# Patient Record
Sex: Female | Born: 1960 | Race: Black or African American | Hispanic: No | Marital: Married | Smoking: Current every day smoker
Health system: Southern US, Community
[De-identification: ages and names within clinical notes are randomized; demographics above are authoritative.]

## PROBLEM LIST (undated history)

## (undated) DIAGNOSIS — R519 Headache, unspecified: Secondary | ICD-10-CM

## (undated) DIAGNOSIS — I82621 Acute embolism and thrombosis of deep veins of right upper extremity: Secondary | ICD-10-CM

## (undated) DIAGNOSIS — I10 Essential (primary) hypertension: Secondary | ICD-10-CM

## (undated) DIAGNOSIS — E119 Type 2 diabetes mellitus without complications: Secondary | ICD-10-CM

## (undated) DIAGNOSIS — G8929 Other chronic pain: Secondary | ICD-10-CM

## (undated) DIAGNOSIS — R51 Headache: Secondary | ICD-10-CM

## (undated) DIAGNOSIS — R200 Anesthesia of skin: Secondary | ICD-10-CM

## (undated) DIAGNOSIS — H538 Other visual disturbances: Secondary | ICD-10-CM

## (undated) DIAGNOSIS — M542 Cervicalgia: Secondary | ICD-10-CM

## (undated) DIAGNOSIS — R202 Paresthesia of skin: Secondary | ICD-10-CM

## (undated) HISTORY — DX: Paresthesia of skin: R20.2

## (undated) HISTORY — DX: Headache, unspecified: R51.9

## (undated) HISTORY — DX: Type 2 diabetes mellitus without complications: E11.9

## (undated) HISTORY — DX: Headache: R51

## (undated) HISTORY — DX: Essential (primary) hypertension: I10

## (undated) HISTORY — DX: Anesthesia of skin: R20.0

## (undated) HISTORY — DX: Acute embolism and thrombosis of deep veins of right upper extremity: I82.621

## (undated) HISTORY — DX: Cervicalgia: M54.2

## (undated) HISTORY — PX: MYOMECTOMY: SHX85

## (undated) HISTORY — PX: OTHER SURGICAL HISTORY: SHX169

## (undated) HISTORY — DX: Other visual disturbances: H53.8

---

## 1999-07-10 ENCOUNTER — Encounter: Payer: Self-pay | Admitting: Cardiology

## 1999-07-10 ENCOUNTER — Ambulatory Visit (HOSPITAL_COMMUNITY): Admission: RE | Admit: 1999-07-10 | Discharge: 1999-07-10 | Payer: Self-pay | Admitting: Cardiology

## 2002-08-02 ENCOUNTER — Encounter: Admission: RE | Admit: 2002-08-02 | Discharge: 2002-08-02 | Payer: Self-pay | Admitting: Internal Medicine

## 2002-08-02 ENCOUNTER — Encounter: Payer: Self-pay | Admitting: Internal Medicine

## 2003-06-01 ENCOUNTER — Encounter: Payer: Self-pay | Admitting: Cardiology

## 2003-06-01 ENCOUNTER — Encounter: Admission: RE | Admit: 2003-06-01 | Discharge: 2003-06-01 | Payer: Self-pay | Admitting: Cardiology

## 2004-01-07 ENCOUNTER — Ambulatory Visit (HOSPITAL_BASED_OUTPATIENT_CLINIC_OR_DEPARTMENT_OTHER): Admission: RE | Admit: 2004-01-07 | Discharge: 2004-01-07 | Payer: Self-pay | Admitting: Cardiology

## 2004-08-31 ENCOUNTER — Emergency Department (HOSPITAL_COMMUNITY): Admission: EM | Admit: 2004-08-31 | Discharge: 2004-08-31 | Payer: Self-pay | Admitting: Family Medicine

## 2004-11-20 ENCOUNTER — Encounter: Admission: RE | Admit: 2004-11-20 | Discharge: 2004-11-20 | Payer: Self-pay | Admitting: Internal Medicine

## 2005-03-13 ENCOUNTER — Emergency Department (HOSPITAL_COMMUNITY): Admission: EM | Admit: 2005-03-13 | Discharge: 2005-03-13 | Payer: Self-pay | Admitting: Emergency Medicine

## 2005-06-11 ENCOUNTER — Ambulatory Visit (HOSPITAL_COMMUNITY): Admission: RE | Admit: 2005-06-11 | Discharge: 2005-06-11 | Payer: Self-pay | Admitting: Obstetrics

## 2006-10-07 ENCOUNTER — Encounter: Admission: RE | Admit: 2006-10-07 | Discharge: 2006-10-07 | Payer: Self-pay | Admitting: Family Medicine

## 2008-02-04 ENCOUNTER — Emergency Department (HOSPITAL_COMMUNITY): Admission: EM | Admit: 2008-02-04 | Discharge: 2008-02-04 | Payer: Self-pay | Admitting: Family Medicine

## 2008-02-14 ENCOUNTER — Ambulatory Visit (HOSPITAL_COMMUNITY): Admission: RE | Admit: 2008-02-14 | Discharge: 2008-02-14 | Payer: Self-pay | Admitting: Obstetrics

## 2008-02-23 ENCOUNTER — Encounter: Admission: RE | Admit: 2008-02-23 | Discharge: 2008-02-23 | Payer: Self-pay | Admitting: Obstetrics

## 2009-04-06 ENCOUNTER — Encounter: Admission: RE | Admit: 2009-04-06 | Discharge: 2009-04-06 | Payer: Self-pay | Admitting: Internal Medicine

## 2009-08-01 ENCOUNTER — Ambulatory Visit (HOSPITAL_COMMUNITY): Admission: RE | Admit: 2009-08-01 | Discharge: 2009-08-01 | Payer: Self-pay | Admitting: Nurse Practitioner

## 2009-08-23 ENCOUNTER — Emergency Department (HOSPITAL_COMMUNITY): Admission: EM | Admit: 2009-08-23 | Discharge: 2009-08-24 | Payer: Self-pay | Admitting: Emergency Medicine

## 2010-10-06 ENCOUNTER — Encounter: Payer: Self-pay | Admitting: Internal Medicine

## 2010-11-01 ENCOUNTER — Other Ambulatory Visit: Payer: Self-pay | Admitting: Internal Medicine

## 2010-11-01 DIAGNOSIS — Z1231 Encounter for screening mammogram for malignant neoplasm of breast: Secondary | ICD-10-CM

## 2010-11-19 ENCOUNTER — Ambulatory Visit: Payer: Self-pay

## 2010-11-25 ENCOUNTER — Ambulatory Visit: Payer: Self-pay

## 2010-11-27 ENCOUNTER — Ambulatory Visit: Payer: Self-pay

## 2010-12-01 ENCOUNTER — Emergency Department (HOSPITAL_COMMUNITY)
Admission: EM | Admit: 2010-12-01 | Discharge: 2010-12-01 | Disposition: A | Discharge status: Home / Self Care | Payer: Medicaid Other | Attending: Emergency Medicine | Admitting: Emergency Medicine

## 2010-12-01 DIAGNOSIS — M79609 Pain in unspecified limb: Secondary | ICD-10-CM | POA: Insufficient documentation

## 2010-12-01 DIAGNOSIS — I10 Essential (primary) hypertension: Secondary | ICD-10-CM | POA: Insufficient documentation

## 2010-12-01 LAB — POCT I-STAT, CHEM 8
Calcium, Ion: 1.12 mmol/L (ref 1.12–1.32)
Chloride: 94 mEq/L — ABNORMAL LOW (ref 96–112)
Creatinine, Ser: 1.1 mg/dL (ref 0.4–1.2)
Hemoglobin: 15.3 g/dL — ABNORMAL HIGH (ref 12.0–15.0)
Potassium: 3.4 mEq/L — ABNORMAL LOW (ref 3.5–5.1)
TCO2: 33 mmol/L (ref 0–100)

## 2010-12-01 LAB — POCT CARDIAC MARKERS: Troponin i, poc: <0.05 ng/mL (ref 0.00–0.09)

## 2011-03-11 ENCOUNTER — Ambulatory Visit: Payer: Medicaid Other | Attending: Neurosurgery

## 2011-03-11 DIAGNOSIS — IMO0001 Reserved for inherently not codable concepts without codable children: Secondary | ICD-10-CM | POA: Insufficient documentation

## 2011-03-11 DIAGNOSIS — M542 Cervicalgia: Secondary | ICD-10-CM | POA: Insufficient documentation

## 2011-03-11 DIAGNOSIS — R293 Abnormal posture: Secondary | ICD-10-CM | POA: Insufficient documentation

## 2011-03-11 DIAGNOSIS — M2569 Stiffness of other specified joint, not elsewhere classified: Secondary | ICD-10-CM | POA: Insufficient documentation

## 2011-03-27 ENCOUNTER — Ambulatory Visit: Payer: Medicaid Other | Attending: Neurosurgery

## 2011-03-27 DIAGNOSIS — R293 Abnormal posture: Secondary | ICD-10-CM | POA: Insufficient documentation

## 2011-03-27 DIAGNOSIS — IMO0001 Reserved for inherently not codable concepts without codable children: Secondary | ICD-10-CM | POA: Insufficient documentation

## 2011-03-27 DIAGNOSIS — M2569 Stiffness of other specified joint, not elsewhere classified: Secondary | ICD-10-CM | POA: Insufficient documentation

## 2011-03-27 DIAGNOSIS — M542 Cervicalgia: Secondary | ICD-10-CM | POA: Insufficient documentation

## 2011-04-01 ENCOUNTER — Ambulatory Visit: Payer: Medicaid Other

## 2011-04-08 ENCOUNTER — Ambulatory Visit: Payer: Medicaid Other

## 2011-04-17 ENCOUNTER — Ambulatory Visit: Payer: Medicaid Other

## 2012-02-06 ENCOUNTER — Other Ambulatory Visit: Payer: Self-pay | Admitting: Obstetrics

## 2012-02-06 DIAGNOSIS — Z1231 Encounter for screening mammogram for malignant neoplasm of breast: Secondary | ICD-10-CM

## 2012-03-04 ENCOUNTER — Ambulatory Visit (HOSPITAL_COMMUNITY): Payer: Medicaid Other

## 2012-03-25 ENCOUNTER — Ambulatory Visit (HOSPITAL_COMMUNITY): Payer: Medicaid Other | Attending: Obstetrics

## 2012-08-16 ENCOUNTER — Ambulatory Visit: Payer: Medicaid Other

## 2012-09-07 ENCOUNTER — Ambulatory Visit
Admission: RE | Admit: 2012-09-07 | Discharge: 2012-09-07 | Disposition: A | Discharge status: Home / Self Care | Payer: Medicaid Other | Source: Ambulatory Visit | Attending: Obstetrics | Admitting: Obstetrics

## 2012-09-07 DIAGNOSIS — Z1231 Encounter for screening mammogram for malignant neoplasm of breast: Secondary | ICD-10-CM

## 2012-09-20 ENCOUNTER — Other Ambulatory Visit: Payer: Self-pay | Admitting: Obstetrics

## 2012-09-20 DIAGNOSIS — R928 Other abnormal and inconclusive findings on diagnostic imaging of breast: Secondary | ICD-10-CM

## 2012-09-23 ENCOUNTER — Ambulatory Visit
Admission: RE | Admit: 2012-09-23 | Discharge: 2012-09-23 | Disposition: A | Discharge status: Home / Self Care | Payer: Medicaid Other | Source: Ambulatory Visit | Attending: Obstetrics | Admitting: Obstetrics

## 2012-09-23 DIAGNOSIS — R928 Other abnormal and inconclusive findings on diagnostic imaging of breast: Secondary | ICD-10-CM

## 2013-07-07 ENCOUNTER — Ambulatory Visit: Payer: Self-pay | Admitting: Obstetrics

## 2013-07-07 ENCOUNTER — Encounter: Payer: Self-pay | Admitting: Obstetrics

## 2013-07-07 ENCOUNTER — Ambulatory Visit (INDEPENDENT_AMBULATORY_CARE_PROVIDER_SITE_OTHER): Payer: Medicaid Other | Admitting: Obstetrics

## 2013-07-07 VITALS — BP 115/74 | HR 66 | Temp 98.2°F | Ht 61.0 in | Wt 144.8 lb

## 2013-07-07 DIAGNOSIS — Z113 Encounter for screening for infections with a predominantly sexual mode of transmission: Secondary | ICD-10-CM

## 2013-07-07 DIAGNOSIS — Z0000 Encounter for general adult medical examination without abnormal findings: Secondary | ICD-10-CM

## 2013-07-07 NOTE — Progress Notes (Signed)
Subjective:     Darlene Castillo is a 52 y.o. female here for a routine exam.  Current complaints: states she is having infrequent irregular menstrual cycles.  Personal health questionnaire reviewed: yes   Gynecologic History Patient's last menstrual period was 05/23/2013. Contraception: none Last Pap: 2013. Results were: negative Last mammogram: 2013. Results were: normal  Obstetric History OB History  No data available     The following portions of the patient's history were reviewed and updated as appropriate: allergies, current medications, past family history, past medical history, past social history, past surgical history and problem list.  Review of Systems Pertinent items are noted in HPI.    Objective:    General appearance: alert and no distress Breasts: normal appearance, no masses or tenderness Abdomen: normal findings: soft, non-tender Pelvic: cervix normal in appearance, external genitalia normal, no adnexal masses or tenderness, no cervical motion tenderness, uterus normal size, shape, and consistency and vagina normal without discharge    Assessment:    Healthy female exam.    Plan:    Follow up in: 1 year.

## 2013-07-09 LAB — GC/CHLAMYDIA PROBE AMP: CT Probe RNA: NEGATIVE

## 2013-07-11 LAB — PAP IG W/ RFLX HPV ASCU

## 2013-07-15 ENCOUNTER — Telehealth: Payer: Self-pay | Admitting: *Deleted

## 2013-07-15 MED ORDER — METRONIDAZOLE 500 MG PO TABS: 500.0000 mg | ORAL_TABLET | Freq: Two times a day (BID) | ORAL | Status: DC

## 2013-07-15 NOTE — Telephone Encounter (Signed)
Call placed to patient no answer. A detailed voice message was left informing patient of lab results and rx to pharmacy. A voice message was left advising patient to call office if any questions.  

## 2013-07-15 NOTE — Telephone Encounter (Signed)
Message copied by Glendell Docker on Fri Jul 15, 2013  3:31 PM ------      Message from: Coral Ceo A      Created: Tue Jul 12, 2013  5:19 AM       Flagyl 500 mg po bid x 7 days. ------

## 2013-09-05 ENCOUNTER — Encounter: Payer: Medicaid Other | Attending: Internal Medicine | Admitting: *Deleted

## 2013-09-05 ENCOUNTER — Encounter: Payer: Self-pay | Admitting: *Deleted

## 2013-09-05 DIAGNOSIS — R7303 Prediabetes: Secondary | ICD-10-CM

## 2013-09-05 DIAGNOSIS — R7309 Other abnormal glucose: Secondary | ICD-10-CM | POA: Insufficient documentation

## 2013-09-05 DIAGNOSIS — Z713 Dietary counseling and surveillance: Secondary | ICD-10-CM | POA: Insufficient documentation

## 2013-09-05 DIAGNOSIS — E669 Obesity, unspecified: Secondary | ICD-10-CM | POA: Insufficient documentation

## 2013-09-05 NOTE — Progress Notes (Signed)
  Medical Nutrition Therapy:  Appt start time: 0800 end time:  0900.   Assessment:  Primary concerns today: Referred here for prediabetes.  HbA1C was 6.4%. Darlene Castillo wants to know what she can do to keep from becoming diabetic.  Darlene Castillo lives with her husband, her son, and her daughter.  Darlene Castillo does the food shopping, her husband and her do the food preparation.  Darlene Castillo likes to [employer], rice, and "quick stuff" thrown in the fryer.  Her husband likes to cook "more health conscience" stuff like vegetables.  She states she is ready to make serious changes.  She states that she is a soda lover.  Currently drinking 4-5 sodas per day.  They prepare their foods in different ways: baking, grilling, and frying.  Darlene Castillo states the family will eat out 2 maybe 3 times per week.  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  Preferred Learning Style all of the following:  Auditory  Visual  Hands on  Learning Readiness:  Ready  MEDICATIONS: see list   DIETARY INTAKE:  24-hr recall:  B ( AM): bagel and hot tea or pepsi  Snk ( AM): none L ( PM): none Snk (2-3 PM): feels woozy, grabs something quickly  D (7 PM): steak or chicken, or hibachi, will eat about 2 cups of rice at dinner  Snk ( PM): sometimes crackers or cookies Beverages: hot tea, soda, rarely water (more in summer)  Usual physical activity: none  Estimated energy needs: 1600 calories 180 g carbohydrates 120 g protein 44 g fat   Nutritional Diagnosis:  NI-5.8.2 Excessive carbohydrate intake As related to large portions of rice and sugary beverages consumed daily.  As evidenced by dietary recall and HbA1C 6.4%.    Intervention:  Nutrition counseling on basic carb counting to control intake of carbohydrates.  Discussed risk factors and physiology of diabetes and meaning of blood glucose numbers and HbA1c tests.  Talked about which foods contain carbohydrates and advised 3 carb choices per meal (45 grams) with half of  that (15-30 grams) at snacks.  Highly advised reducing soda intake gradually as she did not want to switch to diet sodas.  Encouraged Darlene Castillo to eat more regular well-balanced meals and snacks and to avoid skipping meals.  Teaching Method Utilized all of the following: Visual Auditory Hands on  Handouts given during visit include:  Diabetes Lifestyle Book  Yellow Meal Card  Barriers to learning/adherence to lifestyle change: none  Demonstrated degree of understanding via:  Teach Back   Monitoring/Evaluation:  Darlene Castillo would like to discuss heart healthy approaches, food labels, and physical exercise at her follow-up. Dietary intake, exercise, portion control, and body weight in 3 month(s).

## 2013-12-05 ENCOUNTER — Ambulatory Visit: Payer: Medicaid Other | Admitting: *Deleted

## 2014-03-01 ENCOUNTER — Other Ambulatory Visit: Payer: Self-pay | Admitting: Internal Medicine

## 2014-03-01 ENCOUNTER — Other Ambulatory Visit (HOSPITAL_COMMUNITY): Payer: Self-pay | Admitting: Internal Medicine

## 2014-03-01 DIAGNOSIS — Z1231 Encounter for screening mammogram for malignant neoplasm of breast: Secondary | ICD-10-CM

## 2014-03-01 DIAGNOSIS — I739 Peripheral vascular disease, unspecified: Secondary | ICD-10-CM

## 2014-03-03 ENCOUNTER — Ambulatory Visit (HOSPITAL_COMMUNITY)
Admission: RE | Admit: 2014-03-03 | Discharge: 2014-03-03 | Disposition: A | Discharge status: Home / Self Care | Payer: Medicaid Other | Source: Ambulatory Visit | Attending: Internal Medicine | Admitting: Internal Medicine

## 2014-03-03 DIAGNOSIS — I499 Cardiac arrhythmia, unspecified: Secondary | ICD-10-CM | POA: Diagnosis not present

## 2014-03-03 DIAGNOSIS — M79609 Pain in unspecified limb: Secondary | ICD-10-CM | POA: Diagnosis present

## 2014-03-03 DIAGNOSIS — I10 Essential (primary) hypertension: Secondary | ICD-10-CM | POA: Insufficient documentation

## 2014-03-03 DIAGNOSIS — I739 Peripheral vascular disease, unspecified: Secondary | ICD-10-CM

## 2014-03-03 NOTE — Progress Notes (Addendum)
VASCULAR LAB PRELIMINARY  ARTERIAL  ABI completed:    RIGHT    LEFT    PRESSURE WAVEFORM  PRESSURE WAVEFORM  BRACHIAL 129 Triphasic BRACHIAL 128 Triphasic  DP 138 Triphasic DP 140 Biphasic  PT 153 Biphasic PT 137 Biphasic    RIGHT LEFT  ABI 1.19 1.09   Resting ABIs and Doppler waveforms are within normal limits Patient was ambulated briskly for a period of five minutes with symptoms starting at 46 seconds in the thigh and increasing to the calves at completion. ABI at that time were right - 1.25 and the left - 1.15 with normal Doppler waveforms. Results not consistent with claudication. Patient experienced a cardiac arrhythmia post exercise.   SLAUGHTER, [place], RVS 03/03/2014, 3:17 PM

## 2014-03-22 ENCOUNTER — Ambulatory Visit
Admission: RE | Admit: 2014-03-22 | Discharge: 2014-03-22 | Disposition: A | Discharge status: Home / Self Care | Payer: Medicaid Other | Source: Ambulatory Visit | Attending: Internal Medicine | Admitting: Internal Medicine

## 2014-03-22 DIAGNOSIS — Z1231 Encounter for screening mammogram for malignant neoplasm of breast: Secondary | ICD-10-CM

## 2014-05-06 ENCOUNTER — Encounter (HOSPITAL_COMMUNITY): Payer: Self-pay | Admitting: Emergency Medicine

## 2014-05-06 ENCOUNTER — Emergency Department (HOSPITAL_COMMUNITY)
Admission: EM | Admit: 2014-05-06 | Discharge: 2014-05-06 | Disposition: A | Discharge status: Home / Self Care | Payer: Medicaid Other | Attending: Emergency Medicine | Admitting: Emergency Medicine

## 2014-05-06 DIAGNOSIS — F17200 Nicotine dependence, unspecified, uncomplicated: Secondary | ICD-10-CM | POA: Diagnosis not present

## 2014-05-06 DIAGNOSIS — Z79899 Other long term (current) drug therapy: Secondary | ICD-10-CM | POA: Insufficient documentation

## 2014-05-06 DIAGNOSIS — E119 Type 2 diabetes mellitus without complications: Secondary | ICD-10-CM | POA: Insufficient documentation

## 2014-05-06 DIAGNOSIS — I10 Essential (primary) hypertension: Secondary | ICD-10-CM | POA: Diagnosis not present

## 2014-05-06 DIAGNOSIS — R21 Rash and other nonspecific skin eruption: Secondary | ICD-10-CM | POA: Diagnosis present

## 2014-05-06 DIAGNOSIS — Z791 Long term (current) use of non-steroidal anti-inflammatories (NSAID): Secondary | ICD-10-CM | POA: Diagnosis not present

## 2014-05-06 MED ORDER — HYDROCORTISONE 2.5 % EX LOTN: TOPICAL_LOTION | Freq: Two times a day (BID) | CUTANEOUS | Status: DC

## 2014-05-06 NOTE — Discharge Instructions (Signed)
1. Medications: hydrocortisone, usual home medications 2. Treatment: rest, drink plenty of fluids, keep area clean and dry, try not to scratch it 3. Follow Up: Please followup with your primary doctor for discussion of your diagnoses and further evaluation after today's visit; if you do not have a primary care doctor use the resource guide provided to find one;     Rash A rash is a change in the color or texture of your skin. There are many different types of rashes. You may have other problems that accompany your rash. CAUSES   Infections.  Allergic reactions. This can include allergies to pets or foods.  Certain medicines.  Exposure to certain chemicals, soaps, or cosmetics.  Heat.  Exposure to poisonous plants.  Tumors, both cancerous and noncancerous. SYMPTOMS   Redness.  Scaly skin.  Itchy skin.  Dry or cracked skin.  Bumps.  Blisters.  Pain. DIAGNOSIS  Your caregiver may do a physical exam to determine what type of rash you have. A skin sample (biopsy) may be taken and examined under a microscope. TREATMENT  Treatment depends on the type of rash you have. Your caregiver may prescribe certain medicines. For serious conditions, you may need to see a skin doctor (dermatologist). HOME CARE INSTRUCTIONS   Avoid the substance that caused your rash.  Do not scratch your rash. This can cause infection.  You may take cool baths to help stop itching.  Only take over-the-counter or prescription medicines as directed by your caregiver.  Keep all follow-up appointments as directed by your caregiver. SEEK IMMEDIATE MEDICAL CARE IF:  You have increasing pain, swelling, or redness.  You have a fever.  You have new or severe symptoms.  You have body aches, diarrhea, or vomiting.  Your rash is not better after 3 days. MAKE SURE YOU:  Understand these instructions.  Will watch your condition.  Will get help right away if you are not doing well or get  worse. Document Released: 08/22/2002 Document Revised: 11/24/2011 Document Reviewed: 06/16/2011 [facility] Patient Information 2015 [place]. This information is not intended to replace advice given to you by your health care provider. Make sure you discuss any questions you have with your health care provider.

## 2014-05-06 NOTE — ED Provider Notes (Signed)
CSN: [id number]     Arrival date & time 05/06/14  1916 History   This chart was scribed for Joya Gaskins, MD by Modena Jansky, ED Scribe. This patient was seen in room TR07C/TR07C and the patient's care was started at 9:51 PM.   Chief Complaint  Patient presents with  . Rash   Patient is a 53 y.o. female presenting with rash. The history is provided by the patient and medical records. No language interpreter was used.  Rash Associated symptoms: no abdominal pain, no diarrhea, no fatigue, no fever, no headaches, no nausea, no shortness of breath, not vomiting and not wheezing    HPI Comments: Darlene Castillo is a 53 y.o. female who presents to the Emergency Department complaining of a rash on her left lower leg about a week. She states that her rash is unbearably itchy. She reports that she was prescribed a cream by her PCP. She states that the prescribed cream creates a burning sensation. She states that she used a benadryl spray with temporary relief. No travel, no lotions, detergents or other new contacts.  No fever, chills, purulent drainage.  PCP- Avbuere Past Medical History  Diagnosis Date  . Hypertension   . Diabetes mellitus without complication     diet controlled borderline   Past Surgical History  Procedure Laterality Date  . Cesarean section    . Myomectomy    . Wisdoms teeth extracted     Family History  Problem Relation Age of Onset  . Hypertension Mother   . Stroke Paternal Grandmother   . Hypertension Paternal Grandmother   . Diabetes Paternal Grandmother   . Gout Paternal Grandmother   . Alcohol abuse Paternal Grandfather    History  Substance Use Topics  . Smoking status: Current Every Day Smoker -- 0.25 packs/day for 30 years    Types: Cigarettes  . Smokeless tobacco: Not on file  . Alcohol Use: No   OB History   Grav Para Term Preterm Abortions TAB SAB Ect Mult Living                 Review of Systems  Constitutional: Negative for fever,  diaphoresis, appetite change, fatigue and unexpected weight change.  HENT: Negative for mouth sores.   Eyes: Negative for visual disturbance.  Respiratory: Negative for cough, chest tightness, shortness of breath and wheezing.   Cardiovascular: Negative for chest pain.  Gastrointestinal: Negative for nausea, vomiting, abdominal pain, diarrhea and constipation.  Endocrine: Negative for polydipsia, polyphagia and polyuria.  Genitourinary: Negative for dysuria, urgency, frequency and hematuria.  Musculoskeletal: Negative for back pain and neck stiffness.  Skin: Positive for rash.  Allergic/Immunologic: Negative for immunocompromised state.  Neurological: Negative for syncope, light-headedness and headaches.  Hematological: Does not bruise/bleed easily.  Psychiatric/Behavioral: Negative for sleep disturbance. The patient is not nervous/anxious.     Allergies  Review of patient's allergies indicates no known allergies.  Home Medications   Prior to Admission medications   Medication Sig Start Date End Date Taking? Authorizing Provider  amLODipine (NORVASC) 5 MG tablet Take 5 mg by mouth daily.    Historical Provider, MD  hydrocortisone 2.5 % lotion Apply topically 2 (two) times daily. 05/06/14   Buell Parcel, PA-C  lisinopril-hydrochlorothiazide (PRINZIDE,ZESTORETIC) 20-12.5 MG per tablet Take 1 tablet by mouth daily.    Historical Provider, MD  meloxicam (MOBIC) 7.5 MG tablet Take 7.5 mg by mouth daily.    Historical Provider, MD  metoprolol (LOPRESSOR) 100 MG tablet Take 100  mg by mouth 2 (two) times daily.    Historical Provider, MD  metroNIDAZOLE (FLAGYL) 500 MG tablet Take 1 tablet (500 mg total) by mouth 2 (two) times daily. 07/15/13   Brock Bad, MD  nebivolol (BYSTOLIC) 5 MG tablet Take 5 mg by mouth daily.    Historical Provider, MD   BP 131/76  Pulse 55  Temp(Src) 98.6 F (37 C) (Oral)  Resp 18  Ht 5\' 1"  (1.549 m)  Wt 140 lb (63.504 kg)  BMI 26.47 kg/m2  SpO2  100% Physical Exam  Nursing note and vitals reviewed. Constitutional: She is oriented to person, place, and time. She appears well-developed and well-nourished. No distress.  Awake, alert, nontoxic appearance  HENT:  Head: Normocephalic and atraumatic.  Mouth/Throat: Oropharynx is clear and moist. No oropharyngeal exudate.  Eyes: Conjunctivae are normal. No scleral icterus.  Neck: Normal range of motion. Neck supple.  Cardiovascular: Normal rate, regular rhythm, normal heart sounds and intact distal pulses.   No murmur heard. Pulmonary/Chest: Effort normal and breath sounds normal. No respiratory distress. She has no wheezes.  Equal chest expansion  Abdominal: Soft. Bowel sounds are normal. She exhibits no mass. There is no tenderness. There is no rebound and no guarding.  Musculoskeletal: Normal range of motion. She exhibits no edema.  Neurological: She is alert and oriented to person, place, and time. She exhibits normal muscle tone. Coordination normal.  Speech is clear and goal oriented Moves extremities without ataxia  Skin: Skin is warm and dry. Rash noted. She is not diaphoretic. There is erythema.  Small patch of erythema with raised, papular rash located on the lateral left ankle No rash anywhere else on the body  Psychiatric: She has a normal mood and affect.    ED Course  Procedures (including critical care time) DIAGNOSTIC STUDIES: Oxygen Saturation is 100% on RA, normal by my interpretation.    COORDINATION OF CARE: 9:55 PM- Pt advised of plan for treatment and pt agrees.  Labs Review Labs Reviewed - No data to display  Imaging Review No results found.   EKG Interpretation None      MDM   Final diagnoses:  Rash and nonspecific skin eruption   Darlene Castillo presents with nonspecific rash to the left ankle for several weeks. Patient without new detergents, soaps or other reasons to think she may have a contact dermatitis. Patient has been putting an unknown  cream prescribed to her by her primary care physician on this without relief.  No erythema, induration or evidence of secondary infection. Patient has a history of peripheral vascular disease and skin appears to be hypertrophic secondary to this.   Will give hydrocortisone cream for itching and patient is to see dermatology for further evaluation.  I have personally reviewed patient's vitals, nursing note and any pertinent labs or imaging.  I performed an undressed physical exam.    At this time, it has been determined that no acute conditions requiring further emergency intervention. The patient/guardian have been advised of the diagnosis and plan. I reviewed all labs and imaging including any potential incidental findings. We have discussed signs and symptoms that warrant return to the ED, such as erythema, induration, fevers or other signs of infection.  Patient/guardian has voiced understanding and agreed to follow-up with the PCP or specialist in  One week.  Vital signs are stable at discharge.   BP 121/73  Pulse 55  Temp(Src) 99 F (37.2 C) (Oral)  Resp 20  Ht 5'  1" (1.549 m)  Wt 140 lb (63.504 kg)  BMI 26.47 kg/m2  SpO2 100%        Dierdre ForthHannah Kden Wagster, PA-C 05/06/14 2217

## 2014-05-06 NOTE — ED Notes (Signed)
Patient states she has a rash to lower left leg, very itchy and she is unable to take it any longer.  Patient has been using the spray benadryl with immediate relief, but the itchiness comes back.

## 2014-05-08 NOTE — ED Provider Notes (Signed)
Medical screening examination/treatment/procedure(s) were performed by non-physician practitioner and as supervising physician I was immediately available for consultation/collaboration.   EKG Interpretation None        Joya Gaskins, MD 05/08/14 [phone]

## 2014-06-30 ENCOUNTER — Other Ambulatory Visit: Payer: Self-pay

## 2014-09-05 ENCOUNTER — Encounter: Payer: Self-pay | Admitting: Diagnostic Neuroimaging

## 2014-09-05 ENCOUNTER — Ambulatory Visit (INDEPENDENT_AMBULATORY_CARE_PROVIDER_SITE_OTHER): Payer: Medicaid Other | Admitting: Diagnostic Neuroimaging

## 2014-09-05 VITALS — BP 134/78 | HR 66 | Ht 61.0 in | Wt 147.2 lb

## 2014-09-05 DIAGNOSIS — M542 Cervicalgia: Secondary | ICD-10-CM

## 2014-09-05 DIAGNOSIS — R202 Paresthesia of skin: Secondary | ICD-10-CM

## 2014-09-05 DIAGNOSIS — R519 Headache, unspecified: Secondary | ICD-10-CM

## 2014-09-05 DIAGNOSIS — R200 Anesthesia of skin: Secondary | ICD-10-CM

## 2014-09-05 DIAGNOSIS — G43009 Migraine without aura, not intractable, without status migrainosus: Secondary | ICD-10-CM

## 2014-09-05 DIAGNOSIS — R51 Headache: Secondary | ICD-10-CM

## 2014-09-05 DIAGNOSIS — G4700 Insomnia, unspecified: Secondary | ICD-10-CM

## 2014-09-05 DIAGNOSIS — R0683 Snoring: Secondary | ICD-10-CM

## 2014-09-05 MED ORDER — AMITRIPTYLINE HCL 25 MG PO TABS: 25.0000 mg | ORAL_TABLET | Freq: Every day | ORAL | Status: DC

## 2014-09-05 NOTE — Progress Notes (Signed)
[facility]  PATIENT: Darlene HeftyWanda E Montoro DOB: 03/06/1961  REFERRING CLINICIAN: Avbuere HISTORY FROM: patient  REASON FOR VISIT: new consult    HISTORICAL  CHIEF COMPLAINT:  Chief Complaint  Patient presents with  . Headache  . Cold Extremity    fingers and toes    HISTORY OF PRESENT ILLNESS:   53 year old female here for evaluation of headaches and numbness. Has history of migraine headaches, hypertension, borderline diabetes.  5 years ago patient developed onset of migraine without aura. She had phonophobia, photophobia, nausea, triggered by certain smells. Headaches were bilateral severe throbbing. Headaches were controlled with Maxalt. Her last migraine was one year ago.  4 months ago patient had onset of a new type of headache, consisting of bilateral eye pain, eye pressure, blurred vision, daily severe headaches. Patient tried Tylenol without relief. No nausea or vomiting. No photophobia or phonophobia. Patient has had more stress in her life over the past one year. Patient also struggling with insomnia related to frequent urination and anxiety. Patient also has snoring.  Patient reports history of neck pain 2 years ago, treated with physical therapy. She may have had x-ray at that time showing some degenerative changes.  Now patient having more neck pain, tingling and numbness in her fingers and toes. She feels a cold sensation in her extremities. Patient had EMG nerve conduction study recently at another office which she tells me was normal.    REVIEW OF SYSTEMS: Full 14 system review of systems performed and notable only for insomnia daytime sleepiness snoring muscle cramps walking difficulty rash itching headache frequent urination.  ALLERGIES: No Known Allergies  HOME MEDICATIONS: Outpatient Prescriptions Prior to Visit  Medication Sig Dispense Refill  . amLODipine (NORVASC) 5 MG tablet Take 5 mg by mouth daily.    . hydrocortisone 2.5 % lotion  Apply topically 2 (two) times daily. 59 mL 0  . metoprolol (LOPRESSOR) 100 MG tablet Take 100 mg by mouth 2 (two) times daily.    . meloxicam (MOBIC) 7.5 MG tablet Take 7.5 mg by mouth daily as needed.     Marland Kitchen. lisinopril-hydrochlorothiazide (PRINZIDE,ZESTORETIC) 20-12.5 MG per tablet Take 1 tablet by mouth daily.    . metroNIDAZOLE (FLAGYL) 500 MG tablet Take 1 tablet (500 mg total) by mouth 2 (two) times daily. 14 tablet 0  . nebivolol (BYSTOLIC) 5 MG tablet Take 5 mg by mouth daily.     No facility-administered medications prior to visit.    PAST MEDICAL HISTORY: Past Medical History  Diagnosis Date  . Hypertension   . Diabetes mellitus without complication     diet controlled borderline    PAST SURGICAL HISTORY: Past Surgical History  Procedure Laterality Date  . Cesarean section    . Myomectomy    . Wisdoms teeth extracted      FAMILY HISTORY: Family History  Problem Relation Age of Onset  . Hypertension Mother   . Stroke Paternal Grandmother   . Hypertension Paternal Grandmother   . Diabetes Paternal Grandmother   . Gout Paternal Grandmother   . Alcohol abuse Paternal Grandfather     SOCIAL HISTORY:  History   Social History  . Marital Status: Married    Spouse Name: Marice PotterMilton Williamson    Number of Children: 3  . Years of Education: College   Occupational History  . Not on file.   Social History Main Topics  . Smoking status: Current Every Day Smoker -- 0.25 packs/day for 30 years    Types: Cigarettes  .  Smokeless tobacco: Not on file  . Alcohol Use: No  . Drug Use: No  . Sexual Activity: Not on file   Other Topics Concern  . Not on file   Social History Narrative   Patient lives at home with family.   Caffeine use: 4-5 cups daily     PHYSICAL EXAM  Filed Vitals:   09/05/14 0850  BP: 134/78  Pulse: 66  Height: 5\' 1"  (1.549 m)  Weight: 147 lb 3.2 oz (66.769 kg)    Body mass index is 27.83 kg/(m^2).   Visual Acuity Screening   Right eye  Left eye Both eyes  Without correction:     With correction: 20/30 20/30     No flowsheet data found.  GENERAL EXAM: Patient is in no distress; well developed, nourished and groomed; NECK MOVEMENT IS SLIGHTLY PAINFUL/ STIFF; TIRED APPEARING. PROPTOSIS. RED SCLERA.   CARDIOVASCULAR: Regular rate and rhythm, no murmurs, no carotid bruits  NEUROLOGIC: MENTAL STATUS: awake, alert, oriented to person, place and time, recent and remote memory intact, normal attention and concentration, language fluent, comprehension intact, naming intact, fund of knowledge appropriate CRANIAL NERVE: no papilledema on fundoscopic exam, pupils equal and reactive to light, visual fields full to confrontation, extraocular muscles intact, no nystagmus, facial sensation and strength symmetric, hearing intact, palate elevates symmetrically, uvula midline, shoulder shrug symmetric, tongue midline. MOTOR: normal bulk and tone, full strength in the BUE, BLE SENSORY: normal and symmetric to light touch, pinprick, temperature, vibration  COORDINATION: finger-nose-finger, fine finger movements normal REFLEXES: deep tendon reflexes present and symmetric; BUE 2, KNEES 3, ANKLES 0 GAIT/STATION: narrow based gait; able to walk on toes, heels and tandem; romberg is negative    DIAGNOSTIC DATA (LABS, IMAGING, TESTING) - I reviewed patient records, labs, notes, testing and imaging myself where available.  Lab Results  Component Value Date   HGB 15.3* 12/01/2010   HCT 45.0 12/01/2010      Component Value Date/Time   NA 140 12/01/2010 1834   K 3.4* 12/01/2010 1834   CL 94* 12/01/2010 1834   GLUCOSE 106* 12/01/2010 1834   BUN 10 12/01/2010 1834   CREATININE 1.1 12/01/2010 1834   No results found for: CHOL, HDL, LDLCALC, LDLDIRECT, TRIG, CHOLHDL No results found for: WUJW1X No results found for: VITAMINB12 No results found for: TSH    07/27/14 CT HEAD - normal    ASSESSMENT AND PLAN  53 y.o. year old female  here with migraine headaches since 2010, now with new onset and change in type of headache for past 4 months. Could represent chronic daily headaches related to insomnia, stress/anxiety, neck problems, or analgesic overuse. We'll check imaging to rule out secondary causes. Also with numbness and tingling in the toes and feet. Could be metabolic versus cervical spine disease.  PLAN: - MRI brain and c-spine - labs - trial of amitriptyline - try to reduce daily tylenol usage  Orders Placed This Encounter  Procedures  . MR Brain Wo Contrast  . MR Cervical Spine Wo Contrast  . Vitamin B12  . TSH  . Sjogren's syndrome antibods(ssa + ssb)  . ANA w/Reflex  . Sedimentation Rate  . C-reactive Protein  . Lyme, Total Ab Test/Reflex   Meds ordered this encounter  Medications  . amitriptyline (ELAVIL) 25 MG tablet    Sig: Take 1 tablet (25 mg total) by mouth at bedtime.    Dispense:  30 tablet    Refill:  6   Return in about 6 weeks (  around 10/17/2014).    Suanne MarkerVIKRAM R. Adrienne Trombetta, MD 09/05/2014, 9:35 AM Certified in Neurology, Neurophysiology and Neuroimaging  [facility] [address], [address] [place], [place] [address] [phone]

## 2014-09-05 NOTE — Patient Instructions (Signed)
I will check MRI scans and lab testing.  Try amitriptyline 25mg  every night at bedtime.

## 2014-09-06 LAB — TSH: TSH: 1.67 u[IU]/mL (ref 0.450–4.500)

## 2014-09-06 LAB — ANA W/REFLEX: Anti Nuclear Antibody(ANA): NEGATIVE

## 2014-09-06 LAB — VITAMIN B12: Vitamin B-12: 245 pg/mL (ref 211–946)

## 2014-09-06 LAB — LYME, TOTAL AB TEST/REFLEX

## 2014-09-06 LAB — SEDIMENTATION RATE: SED RATE: 31 mm/h (ref 0–40)

## 2014-09-06 LAB — SJOGREN'S SYNDROME ANTIBODS(SSA + SSB)
ENA SSA (RO) Ab: <0.2 AI (ref 0.0–0.9)
ENA SSB (LA) Ab: <0.2 AI (ref 0.0–0.9)

## 2014-09-06 LAB — C-REACTIVE PROTEIN: CRP: 6.3 mg/L — AB (ref 0.0–4.9)

## 2014-09-21 ENCOUNTER — Other Ambulatory Visit: Payer: Medicaid Other

## 2014-09-21 ENCOUNTER — Inpatient Hospital Stay: Admission: RE | Admit: 2014-09-21 | Payer: Medicaid Other | Source: Ambulatory Visit

## 2014-09-27 ENCOUNTER — Telehealth: Payer: Self-pay

## 2014-10-17 ENCOUNTER — Ambulatory Visit: Payer: Medicaid Other | Admitting: Diagnostic Neuroimaging

## 2015-02-16 ENCOUNTER — Other Ambulatory Visit: Payer: Self-pay

## 2015-02-16 DIAGNOSIS — Z1231 Encounter for screening mammogram for malignant neoplasm of breast: Secondary | ICD-10-CM

## 2015-03-28 ENCOUNTER — Ambulatory Visit
Admission: RE | Admit: 2015-03-28 | Discharge: 2015-03-28 | Disposition: A | Discharge status: Home / Self Care | Payer: Medicaid Other | Source: Ambulatory Visit

## 2015-03-28 DIAGNOSIS — Z1231 Encounter for screening mammogram for malignant neoplasm of breast: Secondary | ICD-10-CM

## 2016-06-12 ENCOUNTER — Other Ambulatory Visit: Payer: Self-pay | Admitting: Internal Medicine

## 2016-06-12 DIAGNOSIS — Z1231 Encounter for screening mammogram for malignant neoplasm of breast: Secondary | ICD-10-CM

## 2016-06-18 ENCOUNTER — Ambulatory Visit: Payer: Medicaid Other

## 2016-08-28 ENCOUNTER — Other Ambulatory Visit: Payer: Self-pay | Admitting: Internal Medicine

## 2016-08-28 DIAGNOSIS — E2839 Other primary ovarian failure: Secondary | ICD-10-CM

## 2017-04-07 ENCOUNTER — Ambulatory Visit: Payer: Medicaid Other | Admitting: Obstetrics

## 2017-05-01 ENCOUNTER — Encounter (HOSPITAL_COMMUNITY): Payer: Self-pay | Admitting: Emergency Medicine

## 2017-05-01 ENCOUNTER — Ambulatory Visit (HOSPITAL_COMMUNITY)
Admission: EM | Admit: 2017-05-01 | Discharge: 2017-05-01 | Disposition: A | Discharge status: Home / Self Care | Payer: Medicaid Other | Attending: Family Medicine | Admitting: Family Medicine

## 2017-05-01 DIAGNOSIS — J0101 Acute recurrent maxillary sinusitis: Secondary | ICD-10-CM | POA: Diagnosis not present

## 2017-05-01 MED ORDER — KETOTIFEN FUMARATE 0.025 % OP SOLN: 1.0000 [drp] | Freq: Two times a day (BID) | OPHTHALMIC | 0 refills | Status: DC

## 2017-05-01 MED ORDER — AMOXICILLIN-POT CLAVULANATE 875-125 MG PO TABS: 1.0000 | ORAL_TABLET | Freq: Two times a day (BID) | ORAL | 0 refills | Status: DC

## 2017-05-01 NOTE — ED Provider Notes (Signed)
  [facility]   [id number] 05/01/17 Arrival Time: 1009   SUBJECTIVE:  Darlene Castillo is a 56 y.o. female who presents to the urgent care  with complaint of sinus pain and pressure, with purulent mucus. This is been an ongoing for more than a week, had a case of sinusitis 1 month ago, treated with azithromycin, states that that improved, but now symptoms have returned. Denies fever or chills, does have congestion, no nausea or vomiting, back pain or dysuria, or other symptoms. She also has irritation to her left eye, states that she felt as if an eyelash, earlier this today. No change in vision, no blurred vision, no eye pain, or discharge, just describes as irritating sensation. No red eye, and no eye discharge.  ROS: As per HPI, remainder of ROS negative.   OBJECTIVE:  Vitals:   05/01/17 1057  BP: (!) 146/89  Pulse: 66  Resp: 20  Temp: 99.2 F (37.3 C)  TempSrc: Oral  SpO2: 98%     General appearance: alert; no distress HEENT: normocephalic; atraumatic; conjunctivae normal; No oropharyngeal erythema or edema, tonsils +1, no exudate. Maxillary sinus tenderness bilaterally, with purulent discharge noted on the turbinates. Tympanic membranes pearly gray, nonerythematous, nonbulging Neck: No cervical lymphadenopathy Lungs: clear to auscultation bilaterally Heart: regular rate and rhythm Abdomen: soft, non-tender; bowel sounds normal; no masses or organomegaly; no guarding or rebound tenderness Musculoskeletal/extremities: Pulses +2, no dependent edema, grossly symmetrical  Skin: warm and dry Neurologic: Grossly normal Psychological:  alert and cooperative; normal mood and affect     ASSESSMENT & PLAN:  No diagnosis found.  Meds ordered this encounter  Medications  . gabapentin (NEURONTIN) 100 MG capsule    Sig: Take 100 mg by mouth 3 (three) times daily.  Marland Kitchen amoxicillin-clavulanate (AUGMENTIN) 875-125 MG tablet    Sig: Take 1 tablet by mouth 2 (two) times daily.    Dispense:  14 tablet    Refill:  0    Order Specific Question:   Supervising Provider    Answer:   Elvina Sidle [[id number]]  . ketotifen (ZADITOR) 0.025 % ophthalmic solution    Sig: Place 1 drop into the left eye 2 (two) times daily.    Dispense:  5 mL    Refill:  0    Order Specific Question:   Supervising Provider    Answer:   Elvina Sidle [[id number]]    Reviewed expectations re: course of current medical issues. Questions answered. Outlined signs and symptoms indicating need for more acute intervention. Patient verbalized understanding. After Visit Summary given.    Procedures:     Labs Reviewed - No data to display  No results found.  Allergies  Allergen Reactions  . Ibuprofen Itching    PMHx, SurgHx, SocialHx, Medications, and Allergies were reviewed in the Visit Navigator and updated as appropriate.       Dorena Bodo, NP 05/01/17 1150

## 2017-05-01 NOTE — ED Triage Notes (Signed)
Pt here for poss sinus inf onset 1 week associated w/HA, facial pressure  Also c/o left eye pain onset today... sts she feels like something is inside of her  Denies blurred vision, injury   A&O x4... NAD... Ambulatory

## 2017-05-01 NOTE — Discharge Instructions (Signed)
You have acute sinusitis. I have prescribed Augmentin to treat your infection. Take 1 tablet twice a day for 7 days. You may take Tylenol every 4 hours for fever, pain, or headache, not to exceed 4,000 mg a day, or you may take Ibuprofen every 6-8 hours, not to exceed 1600 mg a day. For congestion, you may use Flonase nasal spray 2 sprays, each nostril once a day, or a pseudoephedrine containing product daily. I also recommend Mucinex, or Mucinex DM twice daily with a full glass of water.    For your eye, I prescribed eye drops, Zaditor, 1 drop twice a day. Follow up with your regular doctor as needed.

## 2017-05-19 ENCOUNTER — Ambulatory Visit: Payer: Medicaid Other | Admitting: Obstetrics

## 2017-11-19 ENCOUNTER — Ambulatory Visit (INDEPENDENT_AMBULATORY_CARE_PROVIDER_SITE_OTHER): Payer: BLUE CROSS/BLUE SHIELD | Admitting: Obstetrics

## 2017-11-19 ENCOUNTER — Other Ambulatory Visit (HOSPITAL_COMMUNITY)
Admission: RE | Admit: 2017-11-19 | Discharge: 2017-11-19 | Disposition: A | Discharge status: Home / Self Care | Payer: BLUE CROSS/BLUE SHIELD | Source: Ambulatory Visit | Attending: Obstetrics | Admitting: Obstetrics

## 2017-11-19 ENCOUNTER — Encounter: Payer: Self-pay | Admitting: Obstetrics

## 2017-11-19 ENCOUNTER — Other Ambulatory Visit: Payer: Self-pay

## 2017-11-19 VITALS — BP 126/76 | HR 61 | Ht 61.0 in | Wt 125.0 lb

## 2017-11-19 DIAGNOSIS — Z01419 Encounter for gynecological examination (general) (routine) without abnormal findings: Secondary | ICD-10-CM

## 2017-11-19 DIAGNOSIS — N951 Menopausal and female climacteric states: Secondary | ICD-10-CM

## 2017-11-19 NOTE — Progress Notes (Signed)
Subjective:        Darlene Castillo is a 57 y.o. female here for a routine exam.  Current complaints: Hot flashes, vaginal dryness and depressed mood..    Personal health questionnaire:  Is patient Darlene Castillo, have a family history of breast and/or ovarian cancer: no Is there a family history of uterine cancer diagnosed at age < 62, gastrointestinal cancer, urinary tract cancer, family member who is a Personnel officer syndrome-associated carrier: no Is the patient overweight and hypertensive, family history of diabetes, personal history of gestational diabetes, preeclampsia or PCOS: no Is patient over 109, have PCOS,  family history of premature CHD under age 35, diabetes, smoke, have hypertension or peripheral artery disease:  no At any time, has a partner hit, kicked or otherwise hurt or frightened you?: no Over the past 2 weeks, have you felt down, depressed or hopeless?: no Over the past 2 weeks, have you felt little interest or pleasure in doing things?:no   Gynecologic History No LMP recorded. Patient is postmenopausal. Contraception: post menopausal status Last Pap: 2014. Results were: normal Last mammogram: 2016. Results were: normal  Obstetric History OB History  No data available    Past Medical History:  Diagnosis Date  . Blurred vision   . Cervicalgia   . Diabetes mellitus without complication (HCC)    diet controlled borderline  . Headache   . Hypertension   . Numbness and tingling     Past Surgical History:  Procedure Laterality Date  . CESAREAN SECTION    . MYOMECTOMY    . wisdoms teeth extracted       Current Outpatient Medications:  .  amLODipine (NORVASC) 5 MG tablet, Take 5 mg by mouth daily., Disp: , Rfl:  .  hydrocortisone 2.5 % lotion, Apply topically 2 (two) times daily., Disp: 59 mL, Rfl: 0 .  lisinopril (PRINIVIL,ZESTRIL) 40 MG tablet, Take 1 tablet by mouth daily., Disp: , Rfl: 0 .  metoprolol (LOPRESSOR) 100 MG tablet, Take 100 mg by mouth 2 (two)  times daily., Disp: , Rfl:  .  amitriptyline (ELAVIL) 25 MG tablet, Take 1 tablet (25 mg total) by mouth at bedtime. (Patient not taking: Reported on 11/19/2017), Disp: 30 tablet, Rfl: 6 .  amoxicillin-clavulanate (AUGMENTIN) 875-125 MG tablet, Take 1 tablet by mouth 2 (two) times daily. (Patient not taking: Reported on 11/19/2017), Disp: 14 tablet, Rfl: 0 .  gabapentin (NEURONTIN) 100 MG capsule, Take 100 mg by mouth 3 (three) times daily., Disp: , Rfl:  .  ketotifen (ZADITOR) 0.025 % ophthalmic solution, Place 1 drop into the left eye 2 (two) times daily. (Patient not taking: Reported on 11/19/2017), Disp: 5 mL, Rfl: 0 Allergies  Allergen Reactions  . Ibuprofen Itching    Social History   Tobacco Use  . Smoking status: Current Every Day Smoker    Packs/day: 0.50    Years: 30.00    Pack years: 15.00    Types: Cigarettes  . Smokeless tobacco: Never Used  Substance Use Topics  . Alcohol use: No    Family History  Problem Relation Age of Onset  . Hypertension Mother   . Stroke Paternal Grandmother   . Hypertension Paternal Grandmother   . Diabetes Paternal Grandmother   . Gout Paternal Grandmother   . Alcohol abuse Paternal Grandfather       Review of Systems  Constitutional: negative for fatigue and weight loss Respiratory: negative for cough and wheezing Cardiovascular: negative for chest pain, fatigue and palpitations Gastrointestinal: negative  for abdominal pain and change in bowel habits Musculoskeletal:negative for myalgias Neurological: negative for gait problems and tremors Behavioral/Psych: negative for abusive relationship, depression Endocrine: negative for temperature intolerance    Genitourinary:negative for abnormal menstrual periods, genital lesions, hot flashes, sexual problems and vaginal discharge Integument/breast: negative for breast lump, breast tenderness, nipple discharge and skin lesion(s)    Objective:       BP 126/76   Pulse 61   Ht 5\' 1"  (1.549 m)    Wt 125 lb (56.7 kg)   BMI 23.62 kg/m  General:   alert  Skin:   no rash or abnormalities  Lungs:   clear to auscultation bilaterally  Heart:   regular rate and rhythm, S1, S2 normal, no murmur, click, rub or gallop  Breasts:   normal without suspicious masses, skin or nipple changes or axillary nodes  Abdomen:  normal findings: no organomegaly, soft, non-tender and no hernia  Pelvis:  External genitalia: normal general appearance Urinary system: urethral meatus normal and bladder without fullness, nontender Vaginal: normal without tenderness, induration or masses Cervix: normal appearance Adnexa: normal bimanual exam Uterus: anteverted and non-tender, normal size   Lab Review Urine pregnancy test Labs reviewed yes Radiologic studies reviewed yes  50% of 20 min visit spent on counseling and coordination of care.   Assessment:     1. 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 Follow up in: 3 years.   No orders of the defined types were placed in this encounter.    Brock BadHARLES A. Youcef Klas MD

## 2017-11-19 NOTE — Progress Notes (Signed)
Presents for AEX. No periods in more than 12 months.  Having Hot flashes, depression in the past and vaginal dryness.

## 2017-11-23 LAB — CYTOLOGY - PAP
Diagnosis: NEGATIVE
HPV: NOT DETECTED

## 2019-02-09 DIAGNOSIS — R7303 Prediabetes: Secondary | ICD-10-CM | POA: Insufficient documentation

## 2019-02-09 DIAGNOSIS — Z8669 Personal history of other diseases of the nervous system and sense organs: Secondary | ICD-10-CM | POA: Insufficient documentation

## 2019-02-09 DIAGNOSIS — E559 Vitamin D deficiency, unspecified: Secondary | ICD-10-CM | POA: Insufficient documentation

## 2019-02-09 DIAGNOSIS — I10 Essential (primary) hypertension: Secondary | ICD-10-CM | POA: Insufficient documentation

## 2019-02-09 DIAGNOSIS — N3281 Overactive bladder: Secondary | ICD-10-CM | POA: Insufficient documentation

## 2019-09-19 DIAGNOSIS — R200 Anesthesia of skin: Secondary | ICD-10-CM | POA: Insufficient documentation

## 2019-09-19 DIAGNOSIS — R202 Paresthesia of skin: Secondary | ICD-10-CM | POA: Insufficient documentation

## 2019-09-19 DIAGNOSIS — R519 Headache, unspecified: Secondary | ICD-10-CM | POA: Insufficient documentation

## 2019-09-27 ENCOUNTER — Encounter (HOSPITAL_COMMUNITY): Payer: Self-pay

## 2019-09-27 ENCOUNTER — Other Ambulatory Visit: Payer: Self-pay

## 2019-09-27 ENCOUNTER — Ambulatory Visit (HOSPITAL_COMMUNITY)
Admission: EM | Admit: 2019-09-27 | Discharge: 2019-09-27 | Disposition: A | Discharge status: Home / Self Care | Payer: BLUE CROSS/BLUE SHIELD | Attending: Physician Assistant | Admitting: Physician Assistant

## 2019-09-27 DIAGNOSIS — I10 Essential (primary) hypertension: Secondary | ICD-10-CM

## 2019-09-27 DIAGNOSIS — R600 Localized edema: Secondary | ICD-10-CM | POA: Diagnosis not present

## 2019-09-27 MED ORDER — FUROSEMIDE 20 MG PO TABS: 40.0000 mg | ORAL_TABLET | Freq: Every day | ORAL | 0 refills | Status: DC

## 2019-09-27 NOTE — Discharge Instructions (Addendum)
I have sent in a prescription for lasix, take 2 tablets for 3 days. This will make you have to use the bathroom more often.   Please hold your amlodipine for a few days, as this can contribute to leg swelling.   Please monitor your blood pressure.  Please follow up with your primary care provider for re-evaluation of your blood pressure and lower extremity swelling.  If your leg swelling worsens, I would like for you to return to Urgent care or go to the emergency department. IF you develop chest pain or shortness of breath, please go to the emergency department.

## 2019-09-27 NOTE — ED Triage Notes (Signed)
Pt states she has a numbness in her fingers in her left hand. Pt states she has some nerve damage. This is a on going  Issue.

## 2019-09-27 NOTE — ED Provider Notes (Signed)
[facility]    CSN: [id number] Arrival date & time: 09/27/19  1113      History   Chief Complaint Chief Complaint  Patient presents with  . Hypertension    HPI Darlene Castillo is a 59 y.o. female.   Patient reports to urgent care today for  High blood pressure and lower leg swelling. She reports she has had high blood pressure readings at home recently, as high as 166 systolic. She notes her pressures are usually 130 and below. She endorses pressure behind her eyes today with this. As well she woke this morning with swelling her left leg. She notes the swelling has gone down some. She does not generally have lower leg swelling. She was recently seen on 09/22/2019 by her primary care and placed on prednisone for sinus headache management. As well, she reports her amlodipine dosing was increased. She denies chest pain, shortness of breath and visual changes.  She also reports on and off issues with feeling like her fingers and toes are cold. This seems to be getting worse. She reports she has discussed this with her Primary care as well. She denies loss of sensation or weakness.      Past Medical History:  Diagnosis Date  . Blurred vision   . Cervicalgia   . Diabetes mellitus without complication (HCC)    diet controlled borderline  . Headache   . Hypertension   . Numbness and tingling     There are no problems to display for this patient.   Past Surgical History:  Procedure Laterality Date  . CESAREAN SECTION    . MYOMECTOMY    . wisdoms teeth extracted      OB History   No obstetric history on file.      Home Medications    Prior to Admission medications   Medication Sig Start Date End Date Taking? Authorizing Provider  amitriptyline (ELAVIL) 25 MG tablet Take 1 tablet (25 mg total) by mouth at bedtime. Patient not taking: Reported on 11/19/2017 09/05/14   Penumalli, Glenford Bayley, MD  amLODipine (NORVASC) 5 MG tablet Take 5 mg by mouth daily.    [provider]  amoxicillin-clavulanate (AUGMENTIN) 875-125 MG tablet Take 1 tablet by mouth 2 (two) times daily. Patient not taking: Reported on 11/19/2017 05/01/17   Dorena Bodo, NP  furosemide (LASIX) 20 MG tablet Take 2 tablets (40 mg total) by mouth daily for 3 days. 09/27/19 09/30/19  Amaury Kuzel, Veryl Speak, PA-C  gabapentin (NEURONTIN) 100 MG capsule Take 100 mg by mouth 3 (three) times daily.    [provider]  hydrocortisone 2.5 % lotion Apply topically 2 (two) times daily. 05/06/14   Muthersbaugh, Dahlia Client, PA-C  ketotifen (ZADITOR) 0.025 % ophthalmic solution Place 1 drop into the left eye 2 (two) times daily. Patient not taking: Reported on 11/19/2017 05/01/17   Dorena Bodo, NP  lisinopril (PRINIVIL,ZESTRIL) 40 MG tablet Take 1 tablet by mouth daily. 08/31/14   [provider]  metoprolol (LOPRESSOR) 100 MG tablet Take 100 mg by mouth 2 (two) times daily.    [provider]    Family History Family History  Problem Relation Age of Onset  . Hypertension Mother   . Stroke Paternal Grandmother   . Hypertension Paternal Grandmother   . Diabetes Paternal Grandmother   . Gout Paternal Grandmother   . Alcohol abuse Paternal Grandfather     Social History Social History   Tobacco Use  . Smoking status: Current Every  Day Smoker    Packs/day: 0.50    Years: 30.00    Pack years: 15.00    Types: Cigarettes  . Smokeless tobacco: Never Used  Substance Use Topics  . Alcohol use: No  . Drug use: No     Allergies   Ibuprofen   Review of Systems Review of Systems  Constitutional: Negative for chills and fever.  HENT: Positive for sinus pressure. Negative for ear pain and sore throat.   Eyes: Negative for pain and visual disturbance.  Respiratory: Negative for cough and shortness of breath.   Cardiovascular: Positive for leg swelling. Negative for chest pain and palpitations.  Gastrointestinal: Negative for abdominal pain, diarrhea, nausea and vomiting.    Endocrine: Negative for polyuria.  Genitourinary: Negative for dysuria and hematuria.  Musculoskeletal: Negative for arthralgias, back pain, joint swelling and myalgias.  Skin: Negative for color change and rash.  Neurological: Negative for dizziness, seizures, syncope, numbness and headaches.  All other systems reviewed and are negative.    Physical Exam Triage Vital Signs ED Triage Vitals  Enc Vitals Group     BP 09/27/19 1215 (!) 157/81     Pulse Rate 09/27/19 1215 68     Resp 09/27/19 1215 16     Temp 09/27/19 1215 98.4 F (36.9 C)     Temp Source 09/27/19 1215 Oral     SpO2 09/27/19 1215 100 %     Weight 09/27/19 1217 138 lb (62.6 kg)     Height --      Head Circumference --      Peak Flow --      Pain Score 09/27/19 1217 8     Pain Loc --      Pain Edu? --      Excl. in GC? --    No data found.  Updated Vital Signs BP (!) 157/81 (BP Location: Right Arm)   Pulse 68   Temp 98.4 F (36.9 C) (Oral)   Resp 16   Wt 138 lb (62.6 kg)   LMP 05/23/2013   SpO2 100%   BMI 26.07 kg/m   Visual Acuity Right Eye Distance:   Left Eye Distance:   Bilateral Distance:    Right Eye Near:   Left Eye Near:    Bilateral Near:     Physical Exam Vitals and nursing note reviewed.  Constitutional:      General: She is not in acute distress.    Appearance: She is well-developed. She is not ill-appearing.  HENT:     Head: Normocephalic and atraumatic.  Eyes:     General: No scleral icterus.    Extraocular Movements: Extraocular movements intact.     Conjunctiva/sclera: Conjunctivae normal.     Pupils: Pupils are equal, round, and reactive to light.  Cardiovascular:     Rate and Rhythm: Normal rate and regular rhythm.     Pulses: Normal pulses.     Heart sounds: No murmur. No friction rub. No gallop.   Pulmonary:     Effort: Pulmonary effort is normal. No respiratory distress.     Breath sounds: Normal breath sounds.  Abdominal:     Palpations: Abdomen is soft.      Tenderness: There is no abdominal tenderness.  Musculoskeletal:     Cervical back: Neck supple.     Right lower leg: Edema (1+ pitting edema on posterior aspect from ankle to inferior margin of calf) present.     Left lower leg: Edema (2+ pitting on posterior  lower leg from ankle to inferior margin of calf) present.     Comments: Of note, no pedal edema or anterior edema. No calf tenderness.  Extremities are warm to touch.   Skin:    General: Skin is warm and dry.     Capillary Refill: Capillary refill takes less than 2 seconds.  Neurological:     General: No focal deficit present.     Mental Status: She is alert and oriented to person, place, and time.     Cranial Nerves: No cranial nerve deficit.     Sensory: No sensory deficit.     Motor: No weakness.     Coordination: Coordination normal.     Gait: Gait normal.     Deep Tendon Reflexes: Reflexes normal.  Psychiatric:        Mood and Affect: Mood normal.        Behavior: Behavior normal.        Thought Content: Thought content normal.        Judgment: Judgment normal.      UC Treatments / Results  Labs (all labs ordered are listed, but only abnormal results are displayed) Labs Reviewed - No data to display  EKG   Radiology No results found.  Procedures Procedures (including critical care time)  Medications Ordered in UC Medications - No data to display  Initial Impression / Assessment and Plan / UC Course  I have reviewed the triage vital signs and the nursing notes.  Pertinent labs & imaging results that were available during my care of the patient were reviewed by me and considered in my medical decision making (see chart for details).     #HTN #Edema - Believe Edema is more likely caused by recent increases in blood pressure and amlodipine. Patient has been on prednisone and taking final dose. Reviewed lab read out from patients chart on phone, Cr 0.81 and K WNL. Sent for lasix for 3 days and holding  amlodipine for a couple days. Instructed to follow up with PCP to further discuss. Have lower suspicion for DVT as swelling is not truly unilateral is not effecting leg circumferentially and there are equally as plausible other causes. No history of DVT. Discussed red flag precautions.   #Cold hands and Feet - Deferred management of this to PCP. Neurologically she is intact. 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 Based on issues with blood pressure, believe addressing this prior to treatment is important.     Final Clinical Impressions(s) / UC Diagnoses   Final diagnoses:  Essential hypertension, benign  Localized edema     Discharge Instructions     I have sent in a prescription for lasix, take 2 tablets for 3 days. This will make you have to use the bathroom more often.   Please hold your amlodipine for a few days, as this can contribute to leg swelling.   Please monitor your blood pressure.  Please follow up with your primary care provider for re-evaluation of your blood pressure and lower extremity swelling.  If your leg swelling worsens, I would like for you to return to Urgent care or go to the emergency department. IF you develop chest pain or shortness of breath, please go to the emergency department.         ED Prescriptions    Medication Sig Dispense Auth. Provider   furosemide (LASIX) 20 MG tablet Take 2 tablets (40 mg total) by mouth daily for 3 days. 6 tablet Aron Inge, Edison Nasuti  E, PA-C     PDMP not reviewed this encounter.   Hermelinda Medicus, PA-C 09/27/19 1422

## 2019-10-12 ENCOUNTER — Encounter (HOSPITAL_COMMUNITY): Payer: Self-pay

## 2019-10-12 ENCOUNTER — Ambulatory Visit (HOSPITAL_COMMUNITY)
Admission: EM | Admit: 2019-10-12 | Discharge: 2019-10-12 | Disposition: A | Discharge status: Home / Self Care | Payer: BLUE CROSS/BLUE SHIELD | Attending: Family Medicine | Admitting: Family Medicine

## 2019-10-12 ENCOUNTER — Other Ambulatory Visit: Payer: Self-pay

## 2019-10-12 DIAGNOSIS — H579 Unspecified disorder of eye and adnexa: Secondary | ICD-10-CM | POA: Diagnosis not present

## 2019-10-12 MED ORDER — DEXAMETHASONE SODIUM PHOSPHATE 10 MG/ML IJ SOLN
10.0000 mg | Freq: Once | INTRAMUSCULAR | Status: AC
  Administered 2019-10-12: 10 mg via INTRAMUSCULAR

## 2019-10-12 MED ORDER — KETOROLAC TROMETHAMINE 30 MG/ML IJ SOLN
30.0000 mg | Freq: Once | INTRAMUSCULAR | Status: AC
  Administered 2019-10-12: 30 mg via INTRAMUSCULAR

## 2019-10-12 MED ORDER — DEXAMETHASONE SODIUM PHOSPHATE 10 MG/ML IJ SOLN
INTRAMUSCULAR | Status: AC
  Filled 2019-10-12: qty 1

## 2019-10-12 MED ORDER — KETOROLAC TROMETHAMINE 30 MG/ML IJ SOLN
INTRAMUSCULAR | Status: AC
  Filled 2019-10-12: qty 1

## 2019-10-12 NOTE — ED Triage Notes (Signed)
Pt states headaches and she under a lot of stress.

## 2019-10-12 NOTE — Discharge Instructions (Addendum)
Start taking Zyrtec and Flonase every day. You can alternate Tylenol and ibuprofen for your pain If this problem continues you will need to follow-up with primary care for further management.

## 2019-10-13 NOTE — ED Provider Notes (Signed)
[place]    CSN: [id number] Arrival date & time: 10/12/19  1404      History   Chief Complaint Chief Complaint  Patient presents with  . Headache    HPI Darlene Castillo is a 59 y.o. female.   Patient is a 59 year old female past medical history of diabetes, headache, hypertension, numbness and tingling.  She presents today with headache.  The pain is located behind both eyes and she describes this as pressure.  This has been an ongoing issue for over a month.  She was seen here on 09/27/2019 for essentially the same thing.  She does have some nasal congestion at times and rhinorrhea.  Has been treated for sinus infection without much relief of her symptoms. Is suppose to wear glasses but does not. strains her eye at times.  Takes Tylenol with temporary relief.  No dizziness, weakness, slurred speech. She also has chronic numbness and tingling in the fingers and toes. She has been evaluated for this before. Recently had blood work drawn that was all normal. She admits to being under a lot of stress. She has been having issues with her BP but is working with her primary care doctor on this. Recently started on lasix daily.  ROS per HPI    Headache   Past Medical History:  Diagnosis Date  . Blurred vision   . Cervicalgia   . Diabetes mellitus without complication (HCC)    diet controlled borderline  . Headache   . Hypertension   . Numbness and tingling     There are no problems to display for this patient.   Past Surgical History:  Procedure Laterality Date  . CESAREAN SECTION    . MYOMECTOMY    . wisdoms teeth extracted      OB History   No obstetric history on file.      Home Medications    Prior to Admission medications   Medication Sig Start Date End Date Taking? Authorizing Provider  amLODipine (NORVASC) 5 MG tablet Take 5 mg by mouth daily.    [provider]  furosemide (LASIX) 20 MG tablet Take 2 tablets (40 mg total) by mouth daily  for 3 days. 09/27/19 09/30/19  Darr, Marguerita Beards, PA-C  gabapentin (NEURONTIN) 100 MG capsule Take 100 mg by mouth 3 (three) times daily.    [provider]  hydrocortisone 2.5 % lotion Apply topically 2 (two) times daily. 05/06/14   Muthersbaugh, Jarrett Soho, PA-C  lisinopril (PRINIVIL,ZESTRIL) 40 MG tablet Take 1 tablet by mouth daily. 08/31/14   [provider]  metoprolol (LOPRESSOR) 100 MG tablet Take 100 mg by mouth 2 (two) times daily.    [provider]  amitriptyline (ELAVIL) 25 MG tablet Take 1 tablet (25 mg total) by mouth at bedtime. Patient not taking: Reported on 11/19/2017 09/05/14 10/12/19  Penni Bombard, MD    Family History Family History  Problem Relation Age of Onset  . Hypertension Mother   . Stroke Paternal Grandmother   . Hypertension Paternal Grandmother   . Diabetes Paternal Grandmother   . Gout Paternal Grandmother   . Alcohol abuse Paternal Grandfather     Social History Social History   Tobacco Use  . Smoking status: Current Every Day Smoker    Packs/day: 0.50    Years: 30.00    Pack years: 15.00    Types: Cigarettes  . Smokeless tobacco: Never Used  Substance Use Topics  . Alcohol use: No  . Drug  use: No     Allergies   Ibuprofen   Review of Systems Review of Systems  Neurological: Positive for headaches.     Physical Exam Triage Vital Signs ED Triage Vitals  Enc Vitals Group     BP 10/12/19 1424 130/76     Pulse Rate 10/12/19 1424 72     Resp 10/12/19 1424 18     Temp 10/12/19 1424 99.3 F (37.4 C)     Temp Source 10/12/19 1424 Oral     SpO2 10/12/19 1424 100 %     Weight --      Height --      Head Circumference --      Peak Flow --      Pain Score 10/12/19 1625 6     Pain Loc --      Pain Edu? --      Excl. in GC? --    No data found.  Updated Vital Signs BP (!) 150/78   Pulse 72   Temp 99.3 F (37.4 C) (Oral)   Resp 18   LMP 05/23/2013   SpO2 100%   Visual Acuity Right Eye Distance:     Left Eye Distance:   Bilateral Distance:    Right Eye Near:   Left Eye Near:    Bilateral Near:     Physical Exam Vitals and nursing note reviewed.  Constitutional:      General: She is not in acute distress.    Appearance: She is well-developed. She is not ill-appearing, toxic-appearing or diaphoretic.  HENT:     Head: Normocephalic and atraumatic.     Right Ear: Tympanic membrane and ear canal normal.     Left Ear: Tympanic membrane and ear canal normal.     Nose: Nose normal.     Mouth/Throat:     Pharynx: Oropharynx is clear.  Eyes:     Extraocular Movements: Extraocular movements intact.     Conjunctiva/sclera: Conjunctivae normal.     Pupils: Pupils are equal, round, and reactive to light.  Cardiovascular:     Rate and Rhythm: Normal rate and regular rhythm.     Pulses: Normal pulses.     Heart sounds: Normal heart sounds.  Pulmonary:     Effort: Pulmonary effort is normal.     Breath sounds: Normal breath sounds.  Musculoskeletal:        General: Normal range of motion.     Cervical back: Normal range of motion.  Skin:    General: Skin is warm and dry.  Neurological:     General: No focal deficit present.     Mental Status: She is alert.     Cranial Nerves: No cranial nerve deficit.     Sensory: No sensory deficit.     Motor: No weakness.     Gait: Gait normal.  Psychiatric:        Mood and Affect: Mood normal.      UC Treatments / Results  Labs (all labs ordered are listed, but only abnormal results are displayed) Labs Reviewed - No data to display  EKG   Radiology No results found.  Procedures Procedures (including critical care time)  Medications Ordered in UC Medications  ketorolac (TORADOL) 30 MG/ML injection 30 mg (30 mg Intramuscular Given 10/12/19 1610)  dexamethasone (DECADRON) injection 10 mg (10 mg Intramuscular Given 10/12/19 1610)    Initial Impression / Assessment and Plan / UC Course  I have reviewed the triage vital signs and  the nursing notes.  Pertinent labs & imaging results that were available during my care of the patient were reviewed by me and considered in my medical decision making (see chart for details).     Eye pressure- chronic migraine vs allergies vs straining of eyes do to not wearing glasses.  Exam benign, no red flags or Neuro deficits.  She has been evaluated for this before here recently.  We will try Toradol and decadron here  In the clinic to see if this helps. Will start Flonase and zyrtec daily.  Recommended her wear her glasses.  She can alternate tylenol and ibuprofen at home.  The numbness and tingling is chronic and she takes gabapentin daily which help with the nerve pain  reccommended follow up with PCP for imaging. No current neck pain but she reports neck issues in the past.  She needs to follow up with her primary care doctor.    Final Clinical Impressions(s) / UC Diagnoses   Final diagnoses:  Eye pressure     Discharge Instructions     Start taking Zyrtec and Flonase every day. You can alternate Tylenol and ibuprofen for your pain If this problem continues you will need to follow-up with primary care for further management.     ED Prescriptions    None     PDMP not reviewed this encounter.   Janace Aris, NP 10/13/19 1241

## 2019-10-17 ENCOUNTER — Other Ambulatory Visit: Payer: Self-pay

## 2019-10-17 ENCOUNTER — Emergency Department (HOSPITAL_COMMUNITY)
Admission: EM | Admit: 2019-10-17 | Discharge: 2019-10-18 | Disposition: A | Discharge status: Home / Self Care | Payer: BLUE CROSS/BLUE SHIELD | Attending: Emergency Medicine | Admitting: Emergency Medicine

## 2019-10-17 ENCOUNTER — Encounter (HOSPITAL_COMMUNITY): Payer: Self-pay | Admitting: Emergency Medicine

## 2019-10-17 DIAGNOSIS — Z79899 Other long term (current) drug therapy: Secondary | ICD-10-CM | POA: Diagnosis not present

## 2019-10-17 DIAGNOSIS — R200 Anesthesia of skin: Secondary | ICD-10-CM | POA: Insufficient documentation

## 2019-10-17 DIAGNOSIS — M5412 Radiculopathy, cervical region: Secondary | ICD-10-CM | POA: Diagnosis not present

## 2019-10-17 DIAGNOSIS — F17210 Nicotine dependence, cigarettes, uncomplicated: Secondary | ICD-10-CM | POA: Insufficient documentation

## 2019-10-17 DIAGNOSIS — M79602 Pain in left arm: Secondary | ICD-10-CM | POA: Diagnosis present

## 2019-10-17 DIAGNOSIS — I10 Essential (primary) hypertension: Secondary | ICD-10-CM | POA: Diagnosis not present

## 2019-10-17 DIAGNOSIS — E119 Type 2 diabetes mellitus without complications: Secondary | ICD-10-CM | POA: Insufficient documentation

## 2019-10-17 LAB — CBC
HCT: 40.6 % (ref 36.0–46.0)
Hemoglobin: 13 g/dL (ref 12.0–15.0)
MCH: 28.3 pg (ref 26.0–34.0)
MCHC: 32 g/dL (ref 30.0–36.0)
MCV: 88.5 fL (ref 80.0–100.0)
Platelets: 264 10*3/uL (ref 150–400)
RBC: 4.59 MIL/uL (ref 3.87–5.11)
RDW: 16 % — ABNORMAL HIGH (ref 11.5–15.5)
WBC: 8 10*3/uL (ref 4.0–10.5)
nRBC: 0 % (ref 0.0–0.2)

## 2019-10-17 LAB — BASIC METABOLIC PANEL
Anion gap: 10 (ref 5–15)
BUN: 11 mg/dL (ref 6–20)
CO2: 28 mmol/L (ref 22–32)
Calcium: 9.2 mg/dL (ref 8.9–10.3)
Chloride: 101 mmol/L (ref 98–111)
Creatinine, Ser: 0.76 mg/dL (ref 0.44–1.00)
GFR calc Af Amer: >60 mL/min (ref >60)
GFR calc non Af Amer: >60 mL/min (ref >60)
Glucose, Bld: 110 mg/dL — ABNORMAL HIGH (ref 70–99)
Potassium: 3.5 mmol/L (ref 3.5–5.1)
Sodium: 139 mmol/L (ref 135–145)

## 2019-10-17 LAB — TROPONIN I (HIGH SENSITIVITY): Troponin I (High Sensitivity): 6 ng/L (ref <18)

## 2019-10-17 MED ORDER — SODIUM CHLORIDE 0.9% FLUSH: 3.0000 mL | Freq: Once | INTRAVENOUS | Status: DC

## 2019-10-17 NOTE — ED Triage Notes (Signed)
Pt reports left sided neck pain down into left arm and also numbness in fingers- pt reports the numbness has been ongoing for over 1 month. Pt states yesterday she began to have left sided neck pain and arm pain which was new.

## 2019-10-18 LAB — TROPONIN I (HIGH SENSITIVITY): Troponin I (High Sensitivity): 6 ng/L (ref <18)

## 2019-10-18 MED ORDER — KETOROLAC TROMETHAMINE 60 MG/2ML IM SOLN
30.0000 mg | Freq: Once | INTRAMUSCULAR | Status: AC
  Administered 2019-10-18: 03:00:00 30 mg via INTRAMUSCULAR
  Filled 2019-10-18: qty 2

## 2019-10-18 MED ORDER — PREDNISONE 10 MG (21) PO TBPK: ORAL_TABLET | Freq: Every day | ORAL | 0 refills | Status: DC

## 2019-10-18 MED ORDER — LIDOCAINE 5 % EX PTCH: 1.0000 | MEDICATED_PATCH | CUTANEOUS | 0 refills | Status: DC

## 2019-10-18 MED ORDER — METHOCARBAMOL 500 MG PO TABS: 500.0000 mg | ORAL_TABLET | Freq: Two times a day (BID) | ORAL | 0 refills | Status: DC

## 2019-10-18 MED ORDER — LIDOCAINE 5 % EX PTCH
1.0000 | MEDICATED_PATCH | CUTANEOUS | Status: DC
  Administered 2019-10-18: 03:00:00 1 via TRANSDERMAL
  Filled 2019-10-18: qty 1

## 2019-10-18 MED ORDER — METHOCARBAMOL 500 MG PO TABS
500.0000 mg | ORAL_TABLET | Freq: Once | ORAL | Status: AC
  Administered 2019-10-18: 03:00:00 500 mg via ORAL
  Filled 2019-10-18: qty 1

## 2019-10-18 NOTE — Discharge Instructions (Addendum)
Thank you for allowing me to care for you today in the Emergency Department.   Please keep your follow-up appointment to have the MRI of your neck completed.  Take 1000 mg of Tylenol once every 8 hours for pain.  You can use 1 lidocaine patch and applied to areas that are sore.  Do not use heating pads over the lidocaine patch as this can cause burns to the skin.  Take prednisone as prescribed.  This is a steroid and can decrease inflammation around her neck.  Take 1 tablet of Robaxin, a muscle relaxer, up to 2 times daily.  Do not drink alcohol or take other sedating substances while you are taking this medication.  Return to the emergency department if you develop new rapidly worsening weakness, if you become unable to move your neck, develop high fevers, confusion, or other new, concerning symptoms.

## 2019-10-18 NOTE — ED Notes (Signed)
Patient reports 7/10 pain to left shoulder and radiating down arm

## 2019-10-18 NOTE — ED Provider Notes (Signed)
[facility] EMERGENCY DEPARTMENT Provider Note   CSN: [id number] Arrival date & time: 10/17/19  1711     History Chief Complaint  Patient presents with  . Chest Pain    Darlene Castillo is a 59 y.o. female with a history of cervicalgia, headache, and hypertension who presents to the emergency department with a chief complaint of left arm pain and numbness.  The patient reports that she has numbness radiating down her left arm into her first and second fingers.  She also reports sharp, shooting pain down her left arm from her neck.  She reports that that she has had this intermittently for several weeks, but her symptoms significantly worsened today after she lifted a heavy case of water.  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  Pain today feels similar to previous flareups of pain from her known bulging disc.  She reports that she is established with neurosurgery and is scheduled for an outpatient MRI in 1 week.  Her primary complaint is that since her symptoms worsen today that her pain has been severe.  She took Tylenol at home with minimal improvement in her symptoms.  She denies weakness, right arm numbness or pain, chest pain, shortness of breath, fever, chills, neck stiffness, headache, confusion, abdominal pain, nausea, vomiting, diarrhea.  No other treatment prior to arrival.  The history is provided by the patient. No language interpreter was used.       Past Medical History:  Diagnosis Date  . Blurred vision   . Cervicalgia   . Diabetes mellitus without complication (HCC)    diet controlled borderline  . Headache   . Hypertension   . Numbness and tingling     There are no problems to display for this patient.   Past Surgical History:  Procedure Laterality Date  . CESAREAN SECTION    . MYOMECTOMY    . wisdoms teeth extracted       OB History   No obstetric history on file.     Family History  Problem Relation Age of Onset  .  Hypertension Mother   . Stroke Paternal Grandmother   . Hypertension Paternal Grandmother   . Diabetes Paternal Grandmother   . Gout Paternal Grandmother   . Alcohol abuse Paternal Grandfather     Social History   Tobacco Use  . Smoking status: Current Every Day Smoker    Packs/day: 0.50    Years: 30.00    Pack years: 15.00    Types: Cigarettes  . Smokeless tobacco: Never Used  Substance Use Topics  . Alcohol use: No  . Drug use: No    Home Medications Prior to Admission medications   Medication Sig Start Date End Date Taking? Authorizing Provider  amLODipine (NORVASC) 5 MG tablet Take 5 mg by mouth daily.    [provider]  furosemide (LASIX) 20 MG tablet Take 2 tablets (40 mg total) by mouth daily for 3 days. 09/27/19 09/30/19  Darr, Veryl Speak, PA-C  gabapentin (NEURONTIN) 100 MG capsule Take 100 mg by mouth 3 (three) times daily.    [provider]  hydrocortisone 2.5 % lotion Apply topically 2 (two) times daily. 05/06/14   Muthersbaugh, Dahlia Client, PA-C  lidocaine (LIDODERM) 5 % Place 1 patch onto the skin daily. Remove & Discard patch within 12 hours or as directed by MD 10/18/19   ,  A, PA-C  lisinopril (PRINIVIL,ZESTRIL) 40 MG tablet Take 1 tablet by mouth  daily. 08/31/14   [provider]  methocarbamol (ROBAXIN) 500 MG tablet Take 1 tablet (500 mg total) by mouth 2 (two) times daily. 10/18/19   ,  A, PA-C  metoprolol (LOPRESSOR) 100 MG tablet Take 100 mg by mouth 2 (two) times daily.    [provider]  predniSONE (STERAPRED UNI-PAK 21 TAB) 10 MG (21) TBPK tablet Take by mouth daily. Take 6 tabs by mouth daily  for 2 days, then 5 tabs for 2 days, then 4 tabs for 2 days, then 3 tabs for 2 days, 2 tabs for 2 days, then 1 tab by mouth daily for 2 days 10/18/19   ,  A, PA-C  amitriptyline (ELAVIL) 25 MG tablet Take 1 tablet (25 mg total) by mouth at bedtime. Patient not taking: Reported on 11/19/2017 09/05/14 10/12/19   Suanne Marker, MD    Allergies    Ibuprofen  Review of Systems   Review of Systems  Constitutional: Negative for activity change, chills and fever.  Respiratory: Negative for shortness of breath.   Cardiovascular: Negative for chest pain.  Gastrointestinal: Negative for abdominal pain.  Genitourinary: Negative for dysuria.  Musculoskeletal: Positive for myalgias and neck pain. Negative for back pain, gait problem and joint swelling.  Skin: Negative for rash.  Allergic/Immunologic: Negative for immunocompromised state.  Neurological: Positive for numbness. Negative for weakness and headaches.  Psychiatric/Behavioral: Negative for confusion.    Physical Exam Updated Vital Signs BP 116/66   Pulse (!) 57   Temp 98.8 F (37.1 C) (Oral)   Resp 17   Ht 5\' 1"  (1.549 m)   Wt 62.1 kg   LMP 05/23/2013   SpO2 93%   BMI 25.89 kg/m   Physical Exam Vitals and nursing note reviewed.  Constitutional:      General: She is not in acute distress.    Appearance: She is not ill-appearing, toxic-appearing or diaphoretic.  HENT:     Head: Normocephalic.  Eyes:     Conjunctiva/sclera: Conjunctivae normal.  Neck:     Comments: No meningismus Cardiovascular:     Rate and Rhythm: Normal rate and regular rhythm.     Heart sounds: No murmur. No friction rub. No gallop.   Pulmonary:     Effort: Pulmonary effort is normal. No respiratory distress.     Breath sounds: No stridor. No wheezing, rhonchi or rales.  Chest:     Chest wall: No tenderness.  Abdominal:     General: There is no distension.     Palpations: Abdomen is soft. There is no mass.     Tenderness: There is no abdominal tenderness. There is no right CVA tenderness, left CVA tenderness, guarding or rebound.     Hernia: No hernia is present.  Musculoskeletal:     Cervical back: Neck supple.     Right lower leg: No edema.     Left lower leg: No edema.     Comments: Full active and passive range of motion of the cervical  spine.  No midline tenderness or tenderness to the bilateral paraspinal muscles to the cervical, thoracic, or lumbar spinous processes.  Sensation is intact and equal to the bilateral upper extremities, specifically in digits 1 and 2 on the left.  Out of 5 strength against resistance.  Good capillary refill.  Full active and passive range of motion of the bilateral shoulders, elbows, and wrists.  Skin:    General: Skin is warm.     Findings: No rash.  Neurological:  Mental Status: She is alert.  Psychiatric:        Behavior: Behavior normal.     ED Results / Procedures / Treatments   Labs (all labs ordered are listed, but only abnormal results are displayed) Labs Reviewed  BASIC METABOLIC PANEL - Abnormal; Notable for the following components:      Result Value   Glucose, Bld 110 (*)    All other components within normal limits  CBC - Abnormal; Notable for the following components:   RDW 16.0 (*)    All other components within normal limits  TROPONIN I (HIGH SENSITIVITY)  TROPONIN I (HIGH SENSITIVITY)    EKG EKG Interpretation  Date/Time:  Monday October 17 2019 17:44:14 EST Ventricular Rate:  76 PR Interval:  148 QRS Duration: 70 QT Interval:  382 QTC Calculation: 429 R Axis:   -22 Text Interpretation: Normal sinus rhythm Minimal voltage criteria for LVH, may be normal variant ( R in aVL ) Borderline ECG No significant change since last tracing Confirmed by Pryor Curia [phone]) on 10/18/2019 2:04:23 AM   Radiology No results found.  Procedures Procedures (including critical care time)  Medications Ordered in ED Medications  ketorolac (TORADOL) injection 30 mg (30 mg Intramuscular Given 10/18/19 0311)  methocarbamol (ROBAXIN) tablet 500 mg (500 mg Oral Given 10/18/19 0981)    ED Course  I have reviewed the triage vital signs and the nursing notes.  Pertinent labs & imaging results that were available during my care of the patient were reviewed by me and considered  in my medical decision making (see chart for details).    MDM Rules/Calculators/A&P                      59 year old female with history of cervicalgia presenting with worsening pain radiating down her left arm and numbness in the first and second digits on the left.  She has known bulging disc and is scheduled for an outpatient MRI next week.  Symptoms acutely worsened after a mechanical injury.  She has no chest pain or shortness of breath.  Screening labs were obtained by triage and are reassuring.  EKG is unchanged from previous.  Patient's primary complaint is her pain.  I have a low suspicion for occult fracture, meningitis, or infectious etiology.  Doubt ACS.  She was treated with Toradol, lidocaine patch, and initiated on a prednisone Dosepak and muscle relaxers.  She was advised to keep her outpatient MRI appointment.  Patient was discussed with Dr. Leonides Schanz, attending physician.  She is hemodynamically stable and in no acute distress.  Safe for discharge home with outpatient follow-up as indicated.  Final Clinical Impression(s) / ED Diagnoses Final diagnoses:  Cervical radiculopathy    Rx / DC Orders ED Discharge Orders         Ordered    predniSONE (STERAPRED UNI-PAK 21 TAB) 10 MG (21) TBPK tablet  Daily     10/18/19 0313    methocarbamol (ROBAXIN) 500 MG tablet  2 times daily     10/18/19 0314    lidocaine (LIDODERM) 5 %  Every 24 hours     10/18/19 0314           Joline Maxcy A, PA-C 10/18/19 0847    Ward, Delice Bison, DO 10/19/19 0130

## 2019-11-08 DIAGNOSIS — M5412 Radiculopathy, cervical region: Secondary | ICD-10-CM | POA: Insufficient documentation

## 2019-11-16 DIAGNOSIS — M5030 Other cervical disc degeneration, unspecified cervical region: Secondary | ICD-10-CM | POA: Insufficient documentation

## 2020-01-18 ENCOUNTER — Other Ambulatory Visit: Payer: Self-pay

## 2020-01-18 ENCOUNTER — Telehealth (HOSPITAL_COMMUNITY): Payer: Self-pay | Admitting: *Deleted

## 2020-01-18 ENCOUNTER — Inpatient Hospital Stay (HOSPITAL_COMMUNITY): Payer: BLUE CROSS/BLUE SHIELD | Admitting: Certified Registered Nurse Anesthetist

## 2020-01-18 ENCOUNTER — Emergency Department (HOSPITAL_COMMUNITY): Payer: BLUE CROSS/BLUE SHIELD

## 2020-01-18 ENCOUNTER — Encounter (HOSPITAL_COMMUNITY): Payer: Self-pay | Admitting: Emergency Medicine

## 2020-01-18 ENCOUNTER — Inpatient Hospital Stay (HOSPITAL_COMMUNITY): Payer: BLUE CROSS/BLUE SHIELD

## 2020-01-18 ENCOUNTER — Encounter (HOSPITAL_COMMUNITY): Admission: EM | Disposition: A | Discharge status: Home Health | Payer: Self-pay | Source: Home / Self Care

## 2020-01-18 ENCOUNTER — Inpatient Hospital Stay (HOSPITAL_COMMUNITY)
Admission: EM | Admit: 2020-01-18 | Discharge: 2020-02-17 | DRG: 003 | Disposition: A | Discharge status: Home Health | Payer: BLUE CROSS/BLUE SHIELD | Attending: Physician Assistant | Admitting: Physician Assistant

## 2020-01-18 DIAGNOSIS — R52 Pain, unspecified: Secondary | ICD-10-CM | POA: Diagnosis not present

## 2020-01-18 DIAGNOSIS — K668 Other specified disorders of peritoneum: Secondary | ICD-10-CM

## 2020-01-18 DIAGNOSIS — Z833 Family history of diabetes mellitus: Secondary | ICD-10-CM

## 2020-01-18 DIAGNOSIS — G9341 Metabolic encephalopathy: Secondary | ICD-10-CM | POA: Diagnosis present

## 2020-01-18 DIAGNOSIS — F1111 Opioid abuse, in remission: Secondary | ICD-10-CM | POA: Diagnosis not present

## 2020-01-18 DIAGNOSIS — K658 Other peritonitis: Secondary | ICD-10-CM | POA: Diagnosis present

## 2020-01-18 DIAGNOSIS — Z20822 Contact with and (suspected) exposure to covid-19: Secondary | ICD-10-CM | POA: Diagnosis present

## 2020-01-18 DIAGNOSIS — K5903 Drug induced constipation: Secondary | ICD-10-CM | POA: Diagnosis present

## 2020-01-18 DIAGNOSIS — I82621 Acute embolism and thrombosis of deep veins of right upper extremity: Secondary | ICD-10-CM | POA: Diagnosis present

## 2020-01-18 DIAGNOSIS — N281 Cyst of kidney, acquired: Secondary | ICD-10-CM | POA: Diagnosis present

## 2020-01-18 DIAGNOSIS — E440 Moderate protein-calorie malnutrition: Secondary | ICD-10-CM | POA: Diagnosis present

## 2020-01-18 DIAGNOSIS — I10 Essential (primary) hypertension: Secondary | ICD-10-CM | POA: Diagnosis present

## 2020-01-18 DIAGNOSIS — E872 Acidosis: Secondary | ICD-10-CM | POA: Diagnosis present

## 2020-01-18 DIAGNOSIS — J80 Acute respiratory distress syndrome: Secondary | ICD-10-CM | POA: Diagnosis not present

## 2020-01-18 DIAGNOSIS — E876 Hypokalemia: Secondary | ICD-10-CM | POA: Diagnosis present

## 2020-01-18 DIAGNOSIS — K5909 Other constipation: Secondary | ICD-10-CM | POA: Diagnosis not present

## 2020-01-18 DIAGNOSIS — J95851 Ventilator associated pneumonia: Secondary | ICD-10-CM | POA: Diagnosis present

## 2020-01-18 DIAGNOSIS — Z930 Tracheostomy status: Secondary | ICD-10-CM

## 2020-01-18 DIAGNOSIS — F1120 Opioid dependence, uncomplicated: Secondary | ICD-10-CM | POA: Diagnosis present

## 2020-01-18 DIAGNOSIS — R509 Fever, unspecified: Secondary | ICD-10-CM | POA: Diagnosis not present

## 2020-01-18 DIAGNOSIS — J95821 Acute postprocedural respiratory failure: Secondary | ICD-10-CM | POA: Diagnosis not present

## 2020-01-18 DIAGNOSIS — R739 Hyperglycemia, unspecified: Secondary | ICD-10-CM

## 2020-01-18 DIAGNOSIS — Z433 Encounter for attention to colostomy: Secondary | ICD-10-CM

## 2020-01-18 DIAGNOSIS — Z978 Presence of other specified devices: Secondary | ICD-10-CM

## 2020-01-18 DIAGNOSIS — D62 Acute posthemorrhagic anemia: Secondary | ICD-10-CM | POA: Diagnosis not present

## 2020-01-18 DIAGNOSIS — J810 Acute pulmonary edema: Secondary | ICD-10-CM | POA: Diagnosis not present

## 2020-01-18 DIAGNOSIS — D72819 Decreased white blood cell count, unspecified: Secondary | ICD-10-CM

## 2020-01-18 DIAGNOSIS — A419 Sepsis, unspecified organism: Secondary | ICD-10-CM | POA: Diagnosis present

## 2020-01-18 DIAGNOSIS — K633 Ulcer of intestine: Secondary | ICD-10-CM

## 2020-01-18 DIAGNOSIS — E1165 Type 2 diabetes mellitus with hyperglycemia: Secondary | ICD-10-CM | POA: Diagnosis present

## 2020-01-18 DIAGNOSIS — Y848 Other medical procedures as the cause of abnormal reaction of the patient, or of later complication, without mention of misadventure at the time of the procedure: Secondary | ICD-10-CM | POA: Diagnosis present

## 2020-01-18 DIAGNOSIS — R6521 Severe sepsis with septic shock: Secondary | ICD-10-CM | POA: Diagnosis present

## 2020-01-18 DIAGNOSIS — IMO0002 Reserved for concepts with insufficient information to code with codable children: Secondary | ICD-10-CM

## 2020-01-18 DIAGNOSIS — J150 Pneumonia due to Klebsiella pneumoniae: Secondary | ICD-10-CM | POA: Diagnosis not present

## 2020-01-18 DIAGNOSIS — J9621 Acute and chronic respiratory failure with hypoxia: Secondary | ICD-10-CM | POA: Diagnosis not present

## 2020-01-18 DIAGNOSIS — J9601 Acute respiratory failure with hypoxia: Secondary | ICD-10-CM | POA: Diagnosis not present

## 2020-01-18 DIAGNOSIS — K631 Perforation of intestine (nontraumatic): Principal | ICD-10-CM | POA: Diagnosis present

## 2020-01-18 DIAGNOSIS — Z7952 Long term (current) use of systemic steroids: Secondary | ICD-10-CM

## 2020-01-18 DIAGNOSIS — J151 Pneumonia due to Pseudomonas: Secondary | ICD-10-CM | POA: Diagnosis not present

## 2020-01-18 DIAGNOSIS — Z823 Family history of stroke: Secondary | ICD-10-CM

## 2020-01-18 DIAGNOSIS — Z4659 Encounter for fitting and adjustment of other gastrointestinal appliance and device: Secondary | ICD-10-CM

## 2020-01-18 DIAGNOSIS — Z9189 Other specified personal risk factors, not elsewhere classified: Secondary | ICD-10-CM

## 2020-01-18 DIAGNOSIS — K567 Ileus, unspecified: Secondary | ICD-10-CM | POA: Diagnosis present

## 2020-01-18 DIAGNOSIS — G9340 Encephalopathy, unspecified: Secondary | ICD-10-CM | POA: Diagnosis not present

## 2020-01-18 DIAGNOSIS — T40605A Adverse effect of unspecified narcotics, initial encounter: Secondary | ICD-10-CM | POA: Diagnosis present

## 2020-01-18 DIAGNOSIS — Z8249 Family history of ischemic heart disease and other diseases of the circulatory system: Secondary | ICD-10-CM

## 2020-01-18 DIAGNOSIS — Z87891 Personal history of nicotine dependence: Secondary | ICD-10-CM | POA: Diagnosis not present

## 2020-01-18 DIAGNOSIS — R0902 Hypoxemia: Secondary | ICD-10-CM

## 2020-01-18 DIAGNOSIS — Z9911 Dependence on respirator [ventilator] status: Secondary | ICD-10-CM

## 2020-01-18 DIAGNOSIS — R1030 Lower abdominal pain, unspecified: Secondary | ICD-10-CM

## 2020-01-18 DIAGNOSIS — F17210 Nicotine dependence, cigarettes, uncomplicated: Secondary | ICD-10-CM | POA: Diagnosis present

## 2020-01-18 DIAGNOSIS — R188 Other ascites: Secondary | ICD-10-CM

## 2020-01-18 DIAGNOSIS — Z79891 Long term (current) use of opiate analgesic: Secondary | ICD-10-CM | POA: Diagnosis not present

## 2020-01-18 DIAGNOSIS — J189 Pneumonia, unspecified organism: Secondary | ICD-10-CM | POA: Diagnosis not present

## 2020-01-18 DIAGNOSIS — K9189 Other postprocedural complications and disorders of digestive system: Secondary | ICD-10-CM | POA: Diagnosis present

## 2020-01-18 DIAGNOSIS — J9620 Acute and chronic respiratory failure, unspecified whether with hypoxia or hypercapnia: Secondary | ICD-10-CM | POA: Diagnosis not present

## 2020-01-18 DIAGNOSIS — T17908A Unspecified foreign body in respiratory tract, part unspecified causing other injury, initial encounter: Secondary | ICD-10-CM

## 2020-01-18 DIAGNOSIS — D649 Anemia, unspecified: Secondary | ICD-10-CM | POA: Diagnosis not present

## 2020-01-18 DIAGNOSIS — Z79899 Other long term (current) drug therapy: Secondary | ICD-10-CM

## 2020-01-18 DIAGNOSIS — G8929 Other chronic pain: Secondary | ICD-10-CM | POA: Diagnosis present

## 2020-01-18 HISTORY — PX: COLON RESECTION: SHX5231

## 2020-01-18 HISTORY — DX: Other chronic pain: G89.29

## 2020-01-18 LAB — MRSA PCR SCREENING: MRSA by PCR: NEGATIVE

## 2020-01-18 LAB — BLOOD GAS, ARTERIAL
Acid-base deficit: 8.5 mmol/L — ABNORMAL HIGH (ref 0.0–2.0)
Acid-base deficit: 7.6 mmol/L — ABNORMAL HIGH (ref 0.0–2.0)
Bicarbonate: 17.3 mmol/L — ABNORMAL LOW (ref 20.0–28.0)
Bicarbonate: 17 mmol/L — ABNORMAL LOW (ref 20.0–28.0)
Drawn by: 295031
FIO2: 90
MECHVT: 370 mL
O2 Saturation: 96.2 %
O2 Saturation: 98.7 %
PEEP: 10 cmH2O
Patient temperature: 98.6
Patient temperature: 98.6
RATE: 26 resp/min
pCO2 arterial: 38.9 mmHg (ref 32.0–48.0)
pCO2 arterial: 33.4 mmHg (ref 32.0–48.0)
pH, Arterial: 7.272 — ABNORMAL LOW (ref 7.350–7.450)
pH, Arterial: 7.328 — ABNORMAL LOW (ref 7.350–7.450)
pO2, Arterial: 94.4 mmHg (ref 83.0–108.0)
pO2, Arterial: 125 mmHg — ABNORMAL HIGH (ref 83.0–108.0)

## 2020-01-18 LAB — TRIGLYCERIDES: Triglycerides: 47 mg/dL (ref <150)

## 2020-01-18 LAB — URINALYSIS, ROUTINE W REFLEX MICROSCOPIC
Bilirubin Urine: NEGATIVE
Glucose, UA: 150 mg/dL — AB
Hgb urine dipstick: NEGATIVE
Ketones, ur: 5 mg/dL — AB
Leukocytes,Ua: NEGATIVE
Nitrite: NEGATIVE
Protein, ur: NEGATIVE mg/dL
Specific Gravity, Urine: 1.015 (ref 1.005–1.030)
pH: 5 (ref 5.0–8.0)

## 2020-01-18 LAB — HIV ANTIBODY (ROUTINE TESTING W REFLEX): HIV Screen 4th Generation wRfx: NONREACTIVE

## 2020-01-18 LAB — COMPREHENSIVE METABOLIC PANEL
ALT: 14 U/L (ref 0–44)
ALT: 12 U/L (ref 0–44)
AST: 26 U/L (ref 15–41)
AST: 28 U/L (ref 15–41)
Albumin: 4.5 g/dL (ref 3.5–5.0)
Albumin: 2.8 g/dL — ABNORMAL LOW (ref 3.5–5.0)
Alkaline Phosphatase: 66 U/L (ref 38–126)
Alkaline Phosphatase: 26 U/L — ABNORMAL LOW (ref 38–126)
Anion gap: 14 (ref 5–15)
Anion gap: 10 (ref 5–15)
BUN: 17 mg/dL (ref 6–20)
BUN: 13 mg/dL (ref 6–20)
CO2: 26 mmol/L (ref 22–32)
CO2: 20 mmol/L — ABNORMAL LOW (ref 22–32)
Calcium: 9 mg/dL (ref 8.9–10.3)
Calcium: 7.1 mg/dL — ABNORMAL LOW (ref 8.9–10.3)
Chloride: 101 mmol/L (ref 98–111)
Chloride: 112 mmol/L — ABNORMAL HIGH (ref 98–111)
Creatinine, Ser: 0.89 mg/dL (ref 0.44–1.00)
Creatinine, Ser: 0.78 mg/dL (ref 0.44–1.00)
GFR calc Af Amer: >60 mL/min (ref >60)
GFR calc Af Amer: >60 mL/min (ref >60)
GFR calc non Af Amer: >60 mL/min (ref >60)
GFR calc non Af Amer: >60 mL/min (ref >60)
Glucose, Bld: 257 mg/dL — ABNORMAL HIGH (ref 70–99)
Glucose, Bld: 141 mg/dL — ABNORMAL HIGH (ref 70–99)
Potassium: 2.5 mmol/L — CL (ref 3.5–5.1)
Potassium: 3 mmol/L — ABNORMAL LOW (ref 3.5–5.1)
Sodium: 141 mmol/L (ref 135–145)
Sodium: 142 mmol/L (ref 135–145)
Total Bilirubin: 0.4 mg/dL (ref 0.3–1.2)
Total Bilirubin: 0.8 mg/dL (ref 0.3–1.2)
Total Protein: 7.9 g/dL (ref 6.5–8.1)
Total Protein: 4.1 g/dL — ABNORMAL LOW (ref 6.5–8.1)

## 2020-01-18 LAB — GLUCOSE, CAPILLARY
Glucose-Capillary: 205 mg/dL — ABNORMAL HIGH (ref 70–99)
Glucose-Capillary: 153 mg/dL — ABNORMAL HIGH (ref 70–99)
Glucose-Capillary: 176 mg/dL — ABNORMAL HIGH (ref 70–99)
Glucose-Capillary: 139 mg/dL — ABNORMAL HIGH (ref 70–99)

## 2020-01-18 LAB — POCT I-STAT 7, (LYTES, BLD GAS, ICA,H+H)
Acid-base deficit: 11 mmol/L — ABNORMAL HIGH (ref 0.0–2.0)
Acid-base deficit: 9 mmol/L — ABNORMAL HIGH (ref 0.0–2.0)
Bicarbonate: 18.5 mmol/L — ABNORMAL LOW (ref 20.0–28.0)
Bicarbonate: 17.4 mmol/L — ABNORMAL LOW (ref 20.0–28.0)
Calcium, Ion: 1.19 mmol/L (ref 1.15–1.40)
Calcium, Ion: 1.12 mmol/L — ABNORMAL LOW (ref 1.15–1.40)
HCT: 41 % (ref 36.0–46.0)
HCT: 29 % — ABNORMAL LOW (ref 36.0–46.0)
Hemoglobin: 13.9 g/dL (ref 12.0–15.0)
Hemoglobin: 9.9 g/dL — ABNORMAL LOW (ref 12.0–15.0)
O2 Saturation: 100 %
O2 Saturation: 99 %
Patient temperature: 37
Potassium: 2.9 mmol/L — ABNORMAL LOW (ref 3.5–5.1)
Potassium: 2.6 mmol/L — CL (ref 3.5–5.1)
Sodium: 142 mmol/L (ref 135–145)
Sodium: 144 mmol/L (ref 135–145)
TCO2: 20 mmol/L — ABNORMAL LOW (ref 22–32)
TCO2: 19 mmol/L — ABNORMAL LOW (ref 22–32)
pCO2 arterial: 54.2 mmHg — ABNORMAL HIGH (ref 32.0–48.0)
pCO2 arterial: 36.8 mmHg (ref 32.0–48.0)
pH, Arterial: 7.14 — CL (ref 7.350–7.450)
pH, Arterial: 7.282 — ABNORMAL LOW (ref 7.350–7.450)
pO2, Arterial: 385 mmHg — ABNORMAL HIGH (ref 83.0–108.0)
pO2, Arterial: 182 mmHg — ABNORMAL HIGH (ref 83.0–108.0)

## 2020-01-18 LAB — CBC
HCT: 41.1 % (ref 36.0–46.0)
HCT: 40.4 % (ref 36.0–46.0)
HCT: 42.3 % (ref 36.0–46.0)
Hemoglobin: 12.6 g/dL (ref 12.0–15.0)
Hemoglobin: 12.7 g/dL (ref 12.0–15.0)
Hemoglobin: 13.7 g/dL (ref 12.0–15.0)
MCH: 27.2 pg (ref 26.0–34.0)
MCH: 28.4 pg (ref 26.0–34.0)
MCH: 27.8 pg (ref 26.0–34.0)
MCHC: 30.7 g/dL (ref 30.0–36.0)
MCHC: 31.4 g/dL (ref 30.0–36.0)
MCHC: 32.4 g/dL (ref 30.0–36.0)
MCV: 88.6 fL (ref 80.0–100.0)
MCV: 90.4 fL (ref 80.0–100.0)
MCV: 86 fL (ref 80.0–100.0)
Platelets: 280 10*3/uL (ref 150–400)
Platelets: 210 10*3/uL (ref 150–400)
Platelets: 243 10*3/uL (ref 150–400)
RBC: 4.64 MIL/uL (ref 3.87–5.11)
RBC: 4.47 MIL/uL (ref 3.87–5.11)
RBC: 4.92 MIL/uL (ref 3.87–5.11)
RDW: 13.2 % (ref 11.5–15.5)
RDW: 13.4 % (ref 11.5–15.5)
RDW: 13.4 % (ref 11.5–15.5)
WBC: 8.8 10*3/uL (ref 4.0–10.5)
WBC: 1.3 10*3/uL — CL (ref 4.0–10.5)
WBC: 2.3 10*3/uL — ABNORMAL LOW (ref 4.0–10.5)
nRBC: 0 % (ref 0.0–0.2)
nRBC: 0 % (ref 0.0–0.2)
nRBC: 0 % (ref 0.0–0.2)

## 2020-01-18 LAB — HEMOGLOBIN A1C
Hgb A1c MFr Bld: 6.3 % — ABNORMAL HIGH (ref 4.8–5.6)
Mean Plasma Glucose: 134.11 mg/dL

## 2020-01-18 LAB — LACTIC ACID, PLASMA
Lactic Acid, Venous: 7.3 mmol/L (ref 0.5–1.9)
Lactic Acid, Venous: 6.6 mmol/L (ref 0.5–1.9)

## 2020-01-18 LAB — BASIC METABOLIC PANEL
Anion gap: 8 (ref 5–15)
BUN: 16 mg/dL (ref 6–20)
CO2: 16 mmol/L — ABNORMAL LOW (ref 22–32)
Calcium: 7.3 mg/dL — ABNORMAL LOW (ref 8.9–10.3)
Chloride: 118 mmol/L — ABNORMAL HIGH (ref 98–111)
Creatinine, Ser: 0.83 mg/dL (ref 0.44–1.00)
GFR calc Af Amer: >60 mL/min (ref >60)
GFR calc non Af Amer: >60 mL/min (ref >60)
Glucose, Bld: 195 mg/dL — ABNORMAL HIGH (ref 70–99)
Potassium: 4 mmol/L (ref 3.5–5.1)
Sodium: 142 mmol/L (ref 135–145)

## 2020-01-18 LAB — RAPID URINE DRUG SCREEN, HOSP PERFORMED
Amphetamines: NOT DETECTED
Barbiturates: NOT DETECTED
Benzodiazepines: NOT DETECTED
Cocaine: NOT DETECTED
Opiates: NOT DETECTED
Tetrahydrocannabinol: NOT DETECTED

## 2020-01-18 LAB — MAGNESIUM: Magnesium: 2.2 mg/dL (ref 1.7–2.4)

## 2020-01-18 LAB — SAMPLE TO BLOOD BANK

## 2020-01-18 LAB — ABO/RH: ABO/RH(D): O POS

## 2020-01-18 LAB — TYPE AND SCREEN
ABO/RH(D): O POS
Antibody Screen: NEGATIVE

## 2020-01-18 LAB — RESPIRATORY PANEL BY RT PCR (FLU A&B, COVID)
Influenza A by PCR: NEGATIVE
Influenza B by PCR: NEGATIVE
SARS Coronavirus 2 by RT PCR: NEGATIVE

## 2020-01-18 LAB — LIPASE, BLOOD: Lipase: 19 U/L (ref 11–51)

## 2020-01-18 SURGERY — COLON RESECTION
Anesthesia: General | Site: Abdomen

## 2020-01-18 MED ORDER — PROMETHAZINE HCL 25 MG/ML IJ SOLN: 12.5000 mg | Freq: Four times a day (QID) | INTRAMUSCULAR | Status: DC | PRN

## 2020-01-18 MED ORDER — METOPROLOL TARTRATE 5 MG/5ML IV SOLN: 5.0000 mg | Freq: Four times a day (QID) | INTRAVENOUS | Status: DC | PRN

## 2020-01-18 MED ORDER — FENTANYL CITRATE (PF) 250 MCG/5ML IJ SOLN
INTRAMUSCULAR | Status: AC
  Filled 2020-01-18: qty 5

## 2020-01-18 MED ORDER — HYDROMORPHONE HCL 1 MG/ML IJ SOLN
1.0000 mg | Freq: Once | INTRAMUSCULAR | Status: AC
  Administered 2020-01-18: 1 mg via INTRAVENOUS
  Filled 2020-01-18: qty 1

## 2020-01-18 MED ORDER — ALBUMIN HUMAN 5 % IV SOLN
INTRAVENOUS | Status: AC
  Filled 2020-01-18: qty 250

## 2020-01-18 MED ORDER — ROCURONIUM BROMIDE 10 MG/ML (PF) SYRINGE
PREFILLED_SYRINGE | INTRAVENOUS | Status: DC | PRN
  Administered 2020-01-18: 50 mg via INTRAVENOUS

## 2020-01-18 MED ORDER — ONDANSETRON HCL 4 MG/2ML IJ SOLN: 4.0000 mg | Freq: Once | INTRAMUSCULAR | Status: DC | PRN

## 2020-01-18 MED ORDER — LACTATED RINGERS IV SOLN
INTRAVENOUS | Status: DC | PRN
Start: 2020-01-18 — End: 2020-01-18

## 2020-01-18 MED ORDER — SUCCINYLCHOLINE CHLORIDE 200 MG/10ML IV SOSY
PREFILLED_SYRINGE | INTRAVENOUS | Status: DC | PRN
  Administered 2020-01-18: 100 mg via INTRAVENOUS

## 2020-01-18 MED ORDER — DOCUSATE SODIUM 100 MG PO CAPS: 100.0000 mg | ORAL_CAPSULE | Freq: Two times a day (BID) | ORAL | Status: DC

## 2020-01-18 MED ORDER — NOREPINEPHRINE 4 MG/250ML-% IV SOLN: 0.0000 ug/min | INTRAVENOUS | Status: DC

## 2020-01-18 MED ORDER — POTASSIUM CHLORIDE 10 MEQ/100ML IV SOLN
10.0000 meq | INTRAVENOUS | Status: AC
  Administered 2020-01-18 (×2): 10 meq via INTRAVENOUS
  Filled 2020-01-18 (×2): qty 100

## 2020-01-18 MED ORDER — HYDROMORPHONE HCL 1 MG/ML IJ SOLN
0.5000 mg | Freq: Once | INTRAMUSCULAR | Status: AC
  Administered 2020-01-18: 0.5 mg via INTRAVENOUS
  Filled 2020-01-18: qty 1

## 2020-01-18 MED ORDER — FENTANYL CITRATE (PF) 100 MCG/2ML IJ SOLN
50.0000 ug | INTRAMUSCULAR | Status: AC | PRN
  Administered 2020-01-19 (×3): 50 ug via INTRAVENOUS
  Filled 2020-01-18: qty 2

## 2020-01-18 MED ORDER — CHLORHEXIDINE GLUCONATE CLOTH 2 % EX PADS
6.0000 | MEDICATED_PAD | Freq: Every day | CUTANEOUS | Status: DC
  Administered 2020-01-18 – 2020-02-16 (×36): 6 via TOPICAL

## 2020-01-18 MED ORDER — METHOCARBAMOL 1000 MG/10ML IJ SOLN
1000.0000 mg | Freq: Three times a day (TID) | INTRAVENOUS | Status: DC | PRN
  Filled 2020-01-18: qty 10

## 2020-01-18 MED ORDER — DIPHENHYDRAMINE HCL 50 MG/ML IJ SOLN: 25.0000 mg | Freq: Four times a day (QID) | INTRAMUSCULAR | Status: DC | PRN

## 2020-01-18 MED ORDER — IPRATROPIUM-ALBUTEROL 0.5-2.5 (3) MG/3ML IN SOLN
3.0000 mL | RESPIRATORY_TRACT | Status: DC
  Administered 2020-01-18 – 2020-01-21 (×17): 3 mL via RESPIRATORY_TRACT
  Filled 2020-01-18 (×17): qty 3

## 2020-01-18 MED ORDER — METOPROLOL TARTRATE 5 MG/5ML IV SOLN
5.0000 mg | Freq: Four times a day (QID) | INTRAVENOUS | Status: DC
  Filled 2020-01-18: qty 5

## 2020-01-18 MED ORDER — PHENYLEPHRINE HCL-NACL 10-0.9 MG/250ML-% IV SOLN
INTRAVENOUS | Status: DC | PRN
  Administered 2020-01-18: 100 ug/min via INTRAVENOUS

## 2020-01-18 MED ORDER — CHLORHEXIDINE GLUCONATE CLOTH 2 % EX PADS
6.0000 | MEDICATED_PAD | Freq: Once | CUTANEOUS | Status: AC
  Administered 2020-01-18: 6 via TOPICAL

## 2020-01-18 MED ORDER — ACETAMINOPHEN 650 MG RE SUPP: 650.0000 mg | Freq: Four times a day (QID) | RECTAL | Status: DC | PRN

## 2020-01-18 MED ORDER — INSULIN ASPART 100 UNIT/ML ~~LOC~~ SOLN: 0.0000 [IU] | SUBCUTANEOUS | Status: DC

## 2020-01-18 MED ORDER — PHENYLEPHRINE 40 MCG/ML (10ML) SYRINGE FOR IV PUSH (FOR BLOOD PRESSURE SUPPORT)
PREFILLED_SYRINGE | INTRAVENOUS | Status: DC | PRN
  Administered 2020-01-18 (×4): 80 ug via INTRAVENOUS

## 2020-01-18 MED ORDER — DEXAMETHASONE SODIUM PHOSPHATE 10 MG/ML IJ SOLN
INTRAMUSCULAR | Status: DC | PRN
  Administered 2020-01-18: 10 mg via INTRAVENOUS

## 2020-01-18 MED ORDER — FENTANYL CITRATE (PF) 100 MCG/2ML IJ SOLN
INTRAMUSCULAR | Status: AC
  Filled 2020-01-18: qty 2

## 2020-01-18 MED ORDER — PIPERACILLIN-TAZOBACTAM 3.375 G IVPB 30 MIN
3.3750 g | Freq: Once | INTRAVENOUS | Status: AC
  Administered 2020-01-18: 3.375 g via INTRAVENOUS
  Filled 2020-01-18: qty 50

## 2020-01-18 MED ORDER — ROCURONIUM BROMIDE 10 MG/ML (PF) SYRINGE
PREFILLED_SYRINGE | INTRAVENOUS | Status: AC
  Filled 2020-01-18: qty 10

## 2020-01-18 MED ORDER — FENTANYL CITRATE (PF) 100 MCG/2ML IJ SOLN: 50.0000 ug | Freq: Once | INTRAMUSCULAR | Status: DC

## 2020-01-18 MED ORDER — ORAL CARE MOUTH RINSE
15.0000 mL | OROMUCOSAL | Status: DC
  Administered 2020-01-18 – 2020-01-24 (×52): 15 mL via OROMUCOSAL

## 2020-01-18 MED ORDER — ENOXAPARIN SODIUM 40 MG/0.4ML ~~LOC~~ SOLN
40.0000 mg | SUBCUTANEOUS | Status: DC
  Administered 2020-01-19 – 2020-01-25 (×6): 40 mg via SUBCUTANEOUS
  Filled 2020-01-18 (×4): qty 0.4

## 2020-01-18 MED ORDER — PROPOFOL 1000 MG/100ML IV EMUL
5.0000 ug/kg/min | INTRAVENOUS | Status: DC
  Administered 2020-01-18: 35 ug/kg/min via INTRAVENOUS
  Administered 2020-01-18: 5 ug/kg/min via INTRAVENOUS
  Administered 2020-01-19: 10 ug/kg/min via INTRAVENOUS
  Filled 2020-01-18 (×3): qty 100

## 2020-01-18 MED ORDER — OLOPATADINE HCL 0.1 % OP SOLN
1.0000 [drp] | Freq: Two times a day (BID) | OPHTHALMIC | Status: DC | PRN
  Administered 2020-02-04 – 2020-02-13 (×5): 1 [drp] via OPHTHALMIC
  Filled 2020-01-18 (×2): qty 5

## 2020-01-18 MED ORDER — CHLORHEXIDINE GLUCONATE 0.12% ORAL RINSE (MEDLINE KIT)
15.0000 mL | Freq: Two times a day (BID) | OROMUCOSAL | Status: DC
  Administered 2020-01-18 – 2020-01-24 (×12): 15 mL via OROMUCOSAL

## 2020-01-18 MED ORDER — SODIUM CHLORIDE 0.9% FLUSH: 3.0000 mL | Freq: Once | INTRAVENOUS | Status: AC

## 2020-01-18 MED ORDER — ONDANSETRON 4 MG PO TBDP
4.0000 mg | ORAL_TABLET | Freq: Once | ORAL | Status: AC
  Administered 2020-01-18: 4 mg via ORAL
  Filled 2020-01-18: qty 1

## 2020-01-18 MED ORDER — FAMOTIDINE IN NACL 20-0.9 MG/50ML-% IV SOLN
20.0000 mg | Freq: Two times a day (BID) | INTRAVENOUS | Status: AC
  Administered 2020-01-18 – 2020-01-20 (×5): 20 mg via INTRAVENOUS
  Filled 2020-01-18 (×5): qty 50

## 2020-01-18 MED ORDER — PANTOPRAZOLE SODIUM 40 MG IV SOLR: 40.0000 mg | Freq: Every day | INTRAVENOUS | Status: DC

## 2020-01-18 MED ORDER — POTASSIUM CHLORIDE 10 MEQ/50ML IV SOLN
10.0000 meq | INTRAVENOUS | Status: AC
  Administered 2020-01-18 (×2): 10 meq via INTRAVENOUS
  Filled 2020-01-18 (×2): qty 50

## 2020-01-18 MED ORDER — MIDAZOLAM HCL 2 MG/2ML IJ SOLN
INTRAMUSCULAR | Status: AC
  Filled 2020-01-18: qty 2

## 2020-01-18 MED ORDER — PROPOFOL 10 MG/ML IV BOLUS
INTRAVENOUS | Status: AC
  Filled 2020-01-18: qty 20

## 2020-01-18 MED ORDER — METOPROLOL TARTRATE 25 MG PO TABS: 100.0000 mg | ORAL_TABLET | Freq: Two times a day (BID) | ORAL | Status: DC

## 2020-01-18 MED ORDER — NOREPINEPHRINE 4 MG/250ML-% IV SOLN
0.0000 ug/min | INTRAVENOUS | Status: DC
  Administered 2020-01-18: 2 ug/min via INTRAVENOUS
  Administered 2020-01-18: 12 ug/min via INTRAVENOUS
  Administered 2020-01-19: 11 ug/min via INTRAVENOUS
  Filled 2020-01-18 (×2): qty 250

## 2020-01-18 MED ORDER — SODIUM CHLORIDE 0.9 % IV BOLUS
1000.0000 mL | Freq: Once | INTRAVENOUS | Status: AC
  Administered 2020-01-18: 1000 mL via INTRAVENOUS

## 2020-01-18 MED ORDER — POTASSIUM CHLORIDE 10 MEQ/50ML IV SOLN
10.0000 meq | INTRAVENOUS | Status: AC
  Administered 2020-01-18 (×6): 10 meq via INTRAVENOUS
  Filled 2020-01-18 (×5): qty 50

## 2020-01-18 MED ORDER — EPHEDRINE SULFATE-NACL 50-0.9 MG/10ML-% IV SOSY
PREFILLED_SYRINGE | INTRAVENOUS | Status: DC | PRN
  Administered 2020-01-18: 10 mg via INTRAVENOUS

## 2020-01-18 MED ORDER — SODIUM CHLORIDE 0.9% FLUSH: 10.0000 mL | INTRAVENOUS | Status: DC | PRN

## 2020-01-18 MED ORDER — METHADONE HCL 10 MG/ML IJ SOLN
5.0000 mg | Freq: Three times a day (TID) | INTRAMUSCULAR | Status: DC
  Administered 2020-01-18 (×2): 5 mg via INTRAVENOUS
  Filled 2020-01-18 (×3): qty 0.5

## 2020-01-18 MED ORDER — 0.9 % SODIUM CHLORIDE (POUR BTL) OPTIME
TOPICAL | Status: DC | PRN
  Administered 2020-01-18: 10000 mL

## 2020-01-18 MED ORDER — HYDROMORPHONE HCL 1 MG/ML IJ SOLN
1.0000 mg | INTRAMUSCULAR | Status: DC | PRN
  Administered 2020-01-18 (×2): 1 mg via INTRAVENOUS
  Filled 2020-01-18 (×2): qty 1

## 2020-01-18 MED ORDER — ACETAMINOPHEN 10 MG/ML IV SOLN: 1000.0000 mg | Freq: Once | INTRAVENOUS | Status: DC | PRN

## 2020-01-18 MED ORDER — LISINOPRIL 20 MG PO TABS: 40.0000 mg | ORAL_TABLET | Freq: Every day | ORAL | Status: DC

## 2020-01-18 MED ORDER — HYDROMORPHONE HCL 2 MG/ML IJ SOLN
INTRAMUSCULAR | Status: AC
  Filled 2020-01-18: qty 1

## 2020-01-18 MED ORDER — IOHEXOL 300 MG/ML  SOLN
100.0000 mL | Freq: Once | INTRAMUSCULAR | Status: AC | PRN
  Administered 2020-01-18: 100 mL via INTRAVENOUS

## 2020-01-18 MED ORDER — LACTATED RINGERS IV SOLN: INTRAVENOUS | Status: AC

## 2020-01-18 MED ORDER — AMLODIPINE BESYLATE 10 MG PO TABS: 10.0000 mg | ORAL_TABLET | Freq: Every day | ORAL | Status: DC

## 2020-01-18 MED ORDER — POLYETHYLENE GLYCOL 3350 17 G PO PACK: 17.0000 g | PACK | Freq: Every day | ORAL | Status: DC | PRN

## 2020-01-18 MED ORDER — MORPHINE SULFATE (PF) 4 MG/ML IV SOLN
4.0000 mg | Freq: Once | INTRAVENOUS | Status: AC
  Administered 2020-01-18: 4 mg via INTRAMUSCULAR
  Filled 2020-01-18: qty 1

## 2020-01-18 MED ORDER — ENOXAPARIN SODIUM 40 MG/0.4ML ~~LOC~~ SOLN
40.0000 mg | SUBCUTANEOUS | Status: DC
  Administered 2020-01-18: 40 mg via SUBCUTANEOUS
  Filled 2020-01-18: qty 0.4

## 2020-01-18 MED ORDER — HYDROMORPHONE HCL 1 MG/ML IJ SOLN
INTRAMUSCULAR | Status: DC | PRN
  Administered 2020-01-18: .5 mg via INTRAVENOUS
  Administered 2020-01-18: 1.5 mg via INTRAVENOUS

## 2020-01-18 MED ORDER — ONDANSETRON HCL 4 MG/2ML IJ SOLN: 4.0000 mg | Freq: Four times a day (QID) | INTRAMUSCULAR | Status: DC | PRN

## 2020-01-18 MED ORDER — FENTANYL CITRATE (PF) 100 MCG/2ML IJ SOLN: 25.0000 ug | INTRAMUSCULAR | Status: DC | PRN

## 2020-01-18 MED ORDER — ONDANSETRON HCL 4 MG/2ML IJ SOLN
4.0000 mg | Freq: Once | INTRAMUSCULAR | Status: AC
  Administered 2020-01-18: 4 mg via INTRAVENOUS
  Filled 2020-01-18: qty 2

## 2020-01-18 MED ORDER — NICOTINE 7 MG/24HR TD PT24
7.0000 mg | MEDICATED_PATCH | Freq: Every day | TRANSDERMAL | Status: DC
  Administered 2020-01-19 – 2020-02-16 (×27): 7 mg via TRANSDERMAL
  Filled 2020-01-18 (×29): qty 1

## 2020-01-18 MED ORDER — OXYCODONE HCL 5 MG/5ML PO SOLN: 5.0000 mg | Freq: Once | ORAL | Status: DC | PRN

## 2020-01-18 MED ORDER — POTASSIUM CHLORIDE IN NACL 20-0.45 MEQ/L-% IV SOLN
INTRAVENOUS | Status: DC
  Filled 2020-01-18: qty 1000

## 2020-01-18 MED ORDER — POTASSIUM CHLORIDE IN NACL 40-0.9 MEQ/L-% IV SOLN
INTRAVENOUS | Status: DC
  Filled 2020-01-18: qty 1000

## 2020-01-18 MED ORDER — MIDAZOLAM HCL 5 MG/5ML IJ SOLN
INTRAMUSCULAR | Status: DC | PRN
  Administered 2020-01-18 (×2): 2 mg via INTRAVENOUS

## 2020-01-18 MED ORDER — ONDANSETRON 4 MG PO TBDP: 4.0000 mg | ORAL_TABLET | Freq: Four times a day (QID) | ORAL | Status: DC | PRN

## 2020-01-18 MED ORDER — INSULIN ASPART 100 UNIT/ML ~~LOC~~ SOLN
1.0000 [IU] | SUBCUTANEOUS | Status: DC
  Administered 2020-01-18 (×2): 2 [IU] via SUBCUTANEOUS
  Administered 2020-01-18: 1 [IU] via SUBCUTANEOUS
  Administered 2020-01-19 (×2): 2 [IU] via SUBCUTANEOUS
  Administered 2020-01-19: 1 [IU] via SUBCUTANEOUS
  Administered 2020-01-19: 2 [IU] via SUBCUTANEOUS
  Administered 2020-01-19: 1 [IU] via SUBCUTANEOUS
  Administered 2020-01-20 (×2): 2 [IU] via SUBCUTANEOUS

## 2020-01-18 MED ORDER — LIDOCAINE 2% (20 MG/ML) 5 ML SYRINGE
INTRAMUSCULAR | Status: DC | PRN
  Administered 2020-01-18: 60 mg via INTRAVENOUS

## 2020-01-18 MED ORDER — HYDROMORPHONE HCL 1 MG/ML IJ SOLN
1.0000 mg | Freq: Once | INTRAMUSCULAR | Status: AC
  Administered 2020-01-18: 1 mg via INTRAMUSCULAR
  Filled 2020-01-18: qty 1

## 2020-01-18 MED ORDER — VASOPRESSIN 20 UNIT/ML IV SOLN
0.0300 [IU]/min | INTRAVENOUS | Status: DC
  Filled 2020-01-18 (×2): qty 2

## 2020-01-18 MED ORDER — SODIUM CHLORIDE 0.9% FLUSH
10.0000 mL | Freq: Two times a day (BID) | INTRAVENOUS | Status: DC
  Administered 2020-01-19 – 2020-01-20 (×3): 10 mL
  Administered 2020-01-20: 20 mL
  Administered 2020-01-21 – 2020-01-23 (×4): 10 mL
  Administered 2020-01-23: 20 mL
  Administered 2020-01-24 – 2020-02-02 (×19): 10 mL
  Administered 2020-02-02: 40 mL
  Administered 2020-02-03 (×2): 10 mL
  Administered 2020-02-04: 20 mL
  Administered 2020-02-04 – 2020-02-06 (×5): 10 mL
  Administered 2020-02-07: 40 mL
  Administered 2020-02-08: 10 mL
  Administered 2020-02-09: 20 mL
  Administered 2020-02-09 – 2020-02-12 (×7): 10 mL
  Administered 2020-02-13: 20 mL
  Administered 2020-02-13 – 2020-02-15 (×4): 10 mL

## 2020-01-18 MED ORDER — LIDOCAINE 2% (20 MG/ML) 5 ML SYRINGE
INTRAMUSCULAR | Status: AC
  Filled 2020-01-18: qty 5

## 2020-01-18 MED ORDER — POTASSIUM CHLORIDE 10 MEQ/100ML IV SOLN: 10.0000 meq | INTRAVENOUS | Status: AC

## 2020-01-18 MED ORDER — ALBUMIN HUMAN 5 % IV SOLN: INTRAVENOUS | Status: DC | PRN

## 2020-01-18 MED ORDER — FENTANYL CITRATE (PF) 100 MCG/2ML IJ SOLN
50.0000 ug | INTRAMUSCULAR | Status: DC | PRN
  Administered 2020-01-18 (×2): 100 ug via INTRAVENOUS
  Administered 2020-01-19: 50 ug via INTRAVENOUS
  Administered 2020-01-19: 100 ug via INTRAVENOUS
  Administered 2020-01-19 – 2020-01-20 (×7): 50 ug via INTRAVENOUS
  Administered 2020-01-21 (×2): 100 ug via INTRAVENOUS
  Administered 2020-01-21: 50 ug via INTRAVENOUS
  Administered 2020-01-21 (×2): 100 ug via INTRAVENOUS
  Administered 2020-01-21: 50 ug via INTRAVENOUS
  Administered 2020-01-21: 100 ug via INTRAVENOUS
  Administered 2020-01-21: 150 ug via INTRAVENOUS
  Administered 2020-01-21: 50 ug via INTRAVENOUS
  Administered 2020-01-22: 100 ug via INTRAVENOUS
  Administered 2020-01-22: 200 ug via INTRAVENOUS
  Administered 2020-01-22: 100 ug via INTRAVENOUS
  Administered 2020-01-22: 50 ug via INTRAVENOUS
  Administered 2020-01-22: 200 ug via INTRAVENOUS
  Administered 2020-01-23: 100 ug via INTRAVENOUS
  Administered 2020-01-23: 50 ug via INTRAVENOUS
  Administered 2020-01-23 (×3): 100 ug via INTRAVENOUS
  Administered 2020-01-23: 200 ug via INTRAVENOUS
  Administered 2020-01-23: 100 ug via INTRAVENOUS
  Filled 2020-01-18: qty 2

## 2020-01-18 MED ORDER — PROPOFOL 10 MG/ML IV BOLUS
INTRAVENOUS | Status: DC | PRN
  Administered 2020-01-18: 100 mg via INTRAVENOUS
  Administered 2020-01-18: 30 mg via INTRAVENOUS

## 2020-01-18 MED ORDER — SUCCINYLCHOLINE CHLORIDE 200 MG/10ML IV SOSY
PREFILLED_SYRINGE | INTRAVENOUS | Status: AC
  Filled 2020-01-18: qty 10

## 2020-01-18 MED ORDER — ACETAMINOPHEN 500 MG PO TABS: 1000.0000 mg | ORAL_TABLET | Freq: Once | ORAL | Status: DC | PRN

## 2020-01-18 MED ORDER — SODIUM CHLORIDE (PF) 0.9 % IJ SOLN
INTRAMUSCULAR | Status: AC
  Administered 2020-01-18: 3 mL via INTRAVENOUS
  Filled 2020-01-18: qty 50

## 2020-01-18 MED ORDER — FENTANYL CITRATE (PF) 100 MCG/2ML IJ SOLN
INTRAMUSCULAR | Status: DC | PRN
  Administered 2020-01-18 (×5): 50 ug via INTRAVENOUS
  Administered 2020-01-18: 100 ug via INTRAVENOUS

## 2020-01-18 MED ORDER — DIPHENHYDRAMINE HCL 25 MG PO CAPS: 25.0000 mg | ORAL_CAPSULE | Freq: Four times a day (QID) | ORAL | Status: DC | PRN

## 2020-01-18 MED ORDER — FENTANYL 2500MCG IN NS 250ML (10MCG/ML) PREMIX INFUSION
0.0000 ug/h | INTRAVENOUS | Status: DC
  Administered 2020-01-18: 50 ug/h via INTRAVENOUS
  Administered 2020-01-19: 350 ug/h via INTRAVENOUS
  Administered 2020-01-19: 325 ug/h via INTRAVENOUS
  Administered 2020-01-20: 250 ug/h via INTRAVENOUS
  Administered 2020-01-20: 350 ug/h via INTRAVENOUS
  Administered 2020-01-21: 75 ug/h via INTRAVENOUS
  Administered 2020-01-21 – 2020-01-22 (×2): 125 ug/h via INTRAVENOUS
  Administered 2020-01-23: 50 ug/h via INTRAVENOUS
  Administered 2020-01-23: 150 ug/h via INTRAVENOUS
  Filled 2020-01-18 (×10): qty 250

## 2020-01-18 MED ORDER — SUGAMMADEX SODIUM 200 MG/2ML IV SOLN
INTRAVENOUS | Status: DC | PRN
  Administered 2020-01-18: 200 mg via INTRAVENOUS
  Administered 2020-01-18: 100 mg via INTRAVENOUS

## 2020-01-18 MED ORDER — PHENYLEPHRINE HCL (PRESSORS) 10 MG/ML IV SOLN
INTRAVENOUS | Status: AC
  Filled 2020-01-18: qty 1

## 2020-01-18 MED ORDER — OXYCODONE HCL 5 MG PO TABS: 5.0000 mg | ORAL_TABLET | Freq: Once | ORAL | Status: DC | PRN

## 2020-01-18 MED ORDER — SODIUM BICARBONATE 8.4 % IV SOLN
INTRAVENOUS | Status: DC | PRN
Start: 2020-01-18 — End: 2020-01-18
  Administered 2020-01-18: 50 meq via INTRAVENOUS

## 2020-01-18 MED ORDER — PIPERACILLIN-TAZOBACTAM 3.375 G IVPB
3.3750 g | Freq: Three times a day (TID) | INTRAVENOUS | Status: DC
  Administered 2020-01-18 – 2020-01-27 (×27): 3.375 g via INTRAVENOUS
  Filled 2020-01-18 (×27): qty 50

## 2020-01-18 MED ORDER — ACETAMINOPHEN 325 MG PO TABS: 650.0000 mg | ORAL_TABLET | Freq: Four times a day (QID) | ORAL | Status: DC | PRN

## 2020-01-18 MED ORDER — ONDANSETRON HCL 4 MG/2ML IJ SOLN
INTRAMUSCULAR | Status: DC | PRN
  Administered 2020-01-18: 4 mg via INTRAVENOUS

## 2020-01-18 MED ORDER — ONDANSETRON HCL 4 MG/2ML IJ SOLN
INTRAMUSCULAR | Status: AC
  Filled 2020-01-18: qty 2

## 2020-01-18 MED ORDER — SIMETHICONE 80 MG PO CHEW: 40.0000 mg | CHEWABLE_TABLET | Freq: Four times a day (QID) | ORAL | Status: DC | PRN

## 2020-01-18 MED ORDER — ACETAMINOPHEN 160 MG/5ML PO SOLN: 1000.0000 mg | Freq: Once | ORAL | Status: DC | PRN

## 2020-01-18 MED ORDER — METHADONE HCL 10 MG/ML IJ SOLN
5.0000 mg | Freq: Three times a day (TID) | INTRAMUSCULAR | Status: DC
  Filled 2020-01-18: qty 0.5

## 2020-01-18 SURGICAL SUPPLY — 48 items
APL SWBSTK 6 STRL LF DISP (MISCELLANEOUS) ×2
APPLICATOR COTTON TIP 6 STRL (MISCELLANEOUS) ×2 IMPLANT
APPLICATOR COTTON TIP 6IN STRL (MISCELLANEOUS) ×4
BLADE EXTENDED COATED 6.5IN (ELECTRODE) ×1 IMPLANT
BLADE HEX COATED 2.75 (ELECTRODE) ×2 IMPLANT
CLIP VESOCCLUDE LG 6/CT (CLIP) IMPLANT
COVER WAND RF STERILE (DRAPES) IMPLANT
DRAPE LAPAROSCOPIC ABDOMINAL (DRAPES) ×2 IMPLANT
DRSG PAD ABDOMINAL 8X10 ST (GAUZE/BANDAGES/DRESSINGS) ×1 IMPLANT
ELECT REM PT RETURN 15FT ADLT (MISCELLANEOUS) ×2 IMPLANT
GAUZE SPONGE 4X4 12PLY STRL (GAUZE/BANDAGES/DRESSINGS) ×2 IMPLANT
GLOVE BIOGEL PI IND STRL 7.0 (GLOVE) ×1 IMPLANT
GLOVE BIOGEL PI INDICATOR 7.0 (GLOVE) ×1
GLOVE INDICATOR 8.0 STRL GRN (GLOVE) ×2 IMPLANT
GLOVE SS BIOGEL STRL SZ 7.5 (GLOVE) ×2 IMPLANT
GLOVE SUPERSENSE BIOGEL SZ 7.5 (GLOVE) ×2
GOWN STRL REUS W/TWL LRG LVL3 (GOWN DISPOSABLE) ×2 IMPLANT
GOWN STRL REUS W/TWL XL LVL3 (GOWN DISPOSABLE) ×4 IMPLANT
KIT TURNOVER KIT A (KITS) IMPLANT
LEGGING LITHOTOMY PAIR STRL (DRAPES) IMPLANT
LIGASURE IMPACT 36 18CM CVD LR (INSTRUMENTS) ×1 IMPLANT
NS IRRIG 1000ML POUR BTL (IV SOLUTION) ×4 IMPLANT
PACK GENERAL/GYN (CUSTOM PROCEDURE TRAY) ×1 IMPLANT
PENCIL SMOKE EVACUATOR (MISCELLANEOUS) IMPLANT
RELOAD PROXIMATE 75MM BLUE (ENDOMECHANICALS) ×2 IMPLANT
RELOAD STAPLE 75 3.8 BLU REG (ENDOMECHANICALS) IMPLANT
SHEARS FOC LG CVD HARMONIC 17C (MISCELLANEOUS) IMPLANT
SHEARS HARMONIC ACE PLUS 36CM (ENDOMECHANICALS) IMPLANT
SPONGE LAP 18X18 RF (DISPOSABLE) ×2 IMPLANT
STAPLER PROXIMATE 75MM BLUE (STAPLE) ×1 IMPLANT
STAPLER VISISTAT 35W (STAPLE) ×2 IMPLANT
SUT PDS AB 1 CTX 36 (SUTURE) IMPLANT
SUT PDS AB 1 TP1 96 (SUTURE) ×2 IMPLANT
SUT PDS AB 3-0 SH 27 (SUTURE) IMPLANT
SUT PDS AB 4-0 SH 27 (SUTURE) IMPLANT
SUT PROLENE 2 0 BLUE (SUTURE) IMPLANT
SUT SILK 2 0 (SUTURE) ×2
SUT SILK 2 0SH CR/8 30 (SUTURE) IMPLANT
SUT SILK 2-0 18XBRD TIE 12 (SUTURE) ×1 IMPLANT
SUT SILK 2-0 30XBRD TIE 12 (SUTURE) IMPLANT
SUT SILK 3 0 (SUTURE) ×2
SUT SILK 3-0 18XBRD TIE 12 (SUTURE) ×1 IMPLANT
SUT VIC AB 2-0 SH 18 (SUTURE) ×2 IMPLANT
SUT VIC AB 3-0 SH 18 (SUTURE) ×5 IMPLANT
TAPE CLOTH SURG 6X10 WHT LF (GAUZE/BANDAGES/DRESSINGS) ×1 IMPLANT
TRAY COLON PACK (CUSTOM PROCEDURE TRAY) ×2 IMPLANT
TRAY FOLEY MTR SLVR 16FR STAT (SET/KITS/TRAYS/PACK) ×2 IMPLANT
YANKAUER SUCT BULB TIP NO VENT (SUCTIONS) ×2 IMPLANT

## 2020-01-18 NOTE — ED Notes (Signed)
Envelopes x 2 with jewelry, cell phone and wallet given to security

## 2020-01-18 NOTE — ED Notes (Signed)
Patient frequently takes her BP cuff off and can not get continuous vitals.

## 2020-01-18 NOTE — Anesthesia Postprocedure Evaluation (Signed)
Anesthesia Post Note  Patient: Darlene Castillo  Procedure(s) Performed: EXPLORATORY LAPAROTOMY SIGMOID RESECTION AND COLOSTOMY (N/A Abdomen)     Patient location during evaluation: SICU Anesthesia Type: General Level of consciousness: sedated Pain management: pain level controlled Vital Signs Assessment: post-procedure vital signs reviewed and stable Respiratory status: patient remains intubated per anesthesia plan Cardiovascular status: stable Postop Assessment: no apparent nausea or vomiting Anesthetic complications: no    Last Vitals:  Vitals:   01/18/20 1300 01/18/20 1433  BP:    Pulse:    Resp:    Temp:    SpO2: 93% 98%    Last Pain:  Vitals:   01/18/20 0926  TempSrc: Oral  PainSc:                  Barry Faircloth

## 2020-01-18 NOTE — Progress Notes (Signed)
Spoke with elink RN RE pt methadone, increasing HR and low urine output.  Elink RN stated to give the methadone even though pt has fentanyl gtt going to prevent withdrawal.  Will continue to monitor HR and Low urine output

## 2020-01-18 NOTE — Anesthesia Preprocedure Evaluation (Addendum)
Anesthesia Evaluation  Patient identified by MRN, date of birth, ID band Patient awake  General Assessment Comment:Acute abdomen, notably distressed and in pain  Reviewed: Allergy & Precautions, NPO status , Patient's Chart, lab work & pertinent test results  History of Anesthesia Complications Negative for: history of anesthetic complications  Airway Mallampati: II  TM Distance: >3 FB Neck ROM: Full    Dental  (+) Dental Advisory Given, Teeth Intact   Pulmonary neg shortness of breath, neg COPD, neg recent URI, Current Smoker and Patient abstained from smoking.,    breath sounds clear to auscultation       Cardiovascular hypertension, Pt. on medications (-) angina(-) Past MI and (-) CHF  Rhythm:Regular Rate:Tachycardia     Neuro/Psych  Headaches, neg Seizures negative psych ROS   GI/Hepatic pneumoperitoneum   Endo/Other  diabetes  Renal/GU      Musculoskeletal   Abdominal   Peds  Hematology   Anesthesia Other Findings   Reproductive/Obstetrics                            Anesthesia Physical Anesthesia Plan  ASA: III and emergent  Anesthesia Plan: General   Post-op Pain Management:    Induction: Intravenous, Rapid sequence and Cricoid pressure planned  PONV Risk Score and Plan: 2 and Ondansetron and Dexamethasone  Airway Management Planned: Oral ETT  Additional Equipment: Arterial line, CVP and Ultrasound Guidance Line Placement  Intra-op Plan:   Post-operative Plan: Possible Post-op intubation/ventilation  Informed Consent: I have reviewed the patients History and Physical, chart, labs and discussed the procedure including the risks, benefits and alternatives for the proposed anesthesia with the patient or authorized representative who has indicated his/her understanding and acceptance.     Dental advisory given  Plan Discussed with: CRNA and Surgeon  Anesthesia Plan  Comments:         Anesthesia Quick Evaluation

## 2020-01-18 NOTE — ED Notes (Addendum)
IV team at bedside 

## 2020-01-18 NOTE — ED Notes (Signed)
Pt ambulates to the bathroom with assistance. 

## 2020-01-18 NOTE — ED Notes (Signed)
Attempted restick, unsuccessful. IV team consult placed.

## 2020-01-18 NOTE — ED Notes (Addendum)
Patient ambulatory to and from restroom with steady gait.

## 2020-01-18 NOTE — Interval H&P Note (Signed)
History and Physical Interval Note:  01/18/2020 10:11 AM  Darlene Castillo  has presented today for surgery, with the diagnosis of pneumoperitoneum.  The various methods of treatment have been discussed with the patient and family. After consideration of risks, benefits and other options for treatment, the patient has consented to  Procedure(s): EXPLORATORY LAPAROTOMY PARTIAL COLON RESECTION, PARTIAL COLOSTOMY (N/A) as a surgical intervention.  The patient's history has been reviewed, patient examined, no change in status, stable for surgery.  I have reviewed the patient's chart and labs.  Questions were answered to the patient's satisfaction.     Marshal Eskew A Daliah Chaudoin

## 2020-01-18 NOTE — ED Notes (Signed)
Pt pacing around room stating it hurts so bad and continues to ask for pain meds. [place] Ouch, RN notified patient that the doctor has to prescribe pain meds. Pt continues to pace around the room. This Clinical research associate will try to obtain vital signs at a later time.

## 2020-01-18 NOTE — Progress Notes (Signed)
Pharmacy Note  59 y/o F with bowel perforation from chronic constipation s/p ex lap with partial colectomy and end colostomy. Patient has a history of methadone use for opioid use disorder at a dose of 27 mg daily.  Patient returned to ICU from OR intubated with propofol and PRN fentanyl for sedation along with prn hydromorphone to maintain RASS goal. Will continue methadone in an attempt to avoid excessive use of opioids for sedation and pain control. Will start with methadone 5 mg iv q 8 hours (15 mg daily ~ half of daily oral maintenance dose) and increase dose as needed to achieve stated goal of limited opioid use. Discussed plan with surgery and critical care team. Would recommend close monitoring of QTc upon initiation and with dosage changes.   Luisa Hart, PharmD, BCPS

## 2020-01-18 NOTE — Consult Note (Signed)
NAME:  Darlene Castillo, MRN:  [id number], DOB:  03-02-1961, LOS: 0 ADMISSION DATE:  01/18/2020, CONSULTATION DATE:  01/18/20 REFERRING MD:  Harriette Bouillon CHIEF COMPLAINT:  VDRF, septic shock  Brief History   Peritonitis 2/2 sigmoid colon perforation s/p ex-lap with resection and end colostomy  History of present illness   Darlene Castillo is a 59 year old woman who presented overnight with abdominal pain, constipation, one episode of vomiting and was found to have a perforated sigmoid colon and peritonitis.  She went to the OR for ex lap and return to the ICU intubated.  Past Medical History  Chronic opiate use Tobacco abuse DM HTN  Significant Hospital Events   5/5 ex lap with partial colectomy and end colostomy  Consults:  [facility] PCCM  Procedures:  5/5 ex lap with partial colectomy and end colostomy 5/5 CVC 5/5 A-line  Significant Diagnostic Tests:  CT abdomen pelvis with contrast 01/17/2020-scattered groundglass opacities in the lower lungs bilaterally, no focal opacities.  Abdominal free air.  Multiple dilated loops of bowel with stool.  Large R renal cyst.  Micro Data:  Covid negative  Antimicrobials:  Zosyn 5/5 >>  Interim history/subjective:  Able to follow commands in all 4 extremities after the OR.  Currently sleeping.  Objective   Blood pressure 123/63, pulse 81, temperature 97.9 F (36.6 C), temperature source Oral, resp. rate 20, last menstrual period 05/23/2013, SpO2 91 %.        Intake/Output Summary (Last 24 hours) at 01/18/2020 1225 Last data filed at 01/18/2020 1200 Gross per 24 hour  Intake 5150 ml  Output --  Net 5150 ml   There were no vitals filed for this visit.  Examination: General: Critically ill-appearing woman lying in bed intubated, sedated HENT: [place]/AT, eyes anicteric, PERRL Lungs: Reduced breath sounds bilaterally, breathing comfortably on mechanical ventilation Cardiovascular: RRR, no murmurs. Abdomen: Distended, tender to palpation and  percussion.  Midline dressing in place. Extremities: No clubbing, cyanosis, or edema Neuro: RASS -4, opens her eyes when suctioned, tracking.  Positive cough and gag.  Not following commands on sedation. GU: Foley in place   5/5 personally reviewed- ET tube 1 cm above the carina, bilateral airspace disease most pronounced in the bases.  Resolved Hospital Problem list     Assessment & Plan:    Acute hypoxic respiratory failure requiring mechanical ventilation; going into ARDS. Current P:F 94 on PEEP 5. -Stat ABG -Low tidal volume ventilation, 4-8 cc/kg ideal body weight with goal plateau less than 30 & driving pressure less than 15.  Titrate PEEP and FiO2 per ARDS protocol.  Increasing PEEP to 10.  Decrease tidal volume to 370, plateau now goal less than 30 and driving pressure 19.  Repeat ABG later today.  Permissive hypercapnia okay as long as she is tolerating from a pH standpoint. -Sedation as required to tolerate mechanical ventilation.  Will need assistance with pain control given her history of chronic opiate use.  Fentanyl, propofol, will add ketamine as a third line agent if required. -VAP prevention protocol -Daily SAT and SBT when appropriate  Septic shock due to peritonitis due to sigmoid colon perforation -Continue Zosyn -Continue vasopressors as required to maintain MAP greater than 65 -Appreciate surgery's assistance -Holding enteral feeds until cleared by surgery -DVT prophylaxis when approved by surgery -At risk for progressive multiorgan failure; strict I/O  Chronic methadone use -Discussed with pharmacy; will start methadone 5 mg every 8 hours IV to help with sedation and pain requirements.  Hypokalemia -Recheck  BMP -Will likely require additional repletion  Leukopenia due to sepsis -Continue to monitor  Lactic acidosis due to septic shock -Continue to trend -Maintain MAP greater than 65 -Continue broad-spectrum antibiotics -Ongoing volume resuscitation;  may be limited due to respiratory failure. -Can add bicarb if pH worsening  Prediabetes; A1c 6.3 -Accu-Cheks every 4 hours with sliding scale insulin PRN. -Goal BG 140-180 while admitted to the ICU.  Will add insulin infusion if not meeting goals.  History of tobacco abuse -Will counsel on the importance of quitting when appropriate -Will monitor for signs of obstruction on MV and need for bronchodilators.   Best practice:  Diet: NPO Pain/Anxiety/Delirium protocol (if indicated): yes VAP protocol (if indicated): yes DVT prophylaxis: SCDs GI prophylaxis: PPI Glucose control: SSI Mobility: bedrest Code Status: full Family Communication: updated husband Husein via phone 5/5 Disposition: ICU  Labs   CBC: Recent Labs  Lab 01/18/20 0525  WBC 8.8  HGB 12.6  HCT 41.1  MCV 88.6  PLT 280    Basic Metabolic Panel: Recent Labs  Lab 01/18/20 0525  NA 141  K 2.5*  CL 101  CO2 26  GLUCOSE 257*  BUN 17  CREATININE 0.89  CALCIUM 9.0  MG 2.2   GFR: CrCl cannot be calculated (Unknown ideal weight.). Recent Labs  Lab 01/18/20 0525  WBC 8.8    Liver Function Tests: Recent Labs  Lab 01/18/20 0525  AST 26  ALT 14  ALKPHOS 66  BILITOT 0.4  PROT 7.9  ALBUMIN 4.5   Recent Labs  Lab 01/18/20 0525  LIPASE 19   No results for input(s): AMMONIA in the last 168 hours.  ABG    Component Value Date/Time   TCO2 33 12/01/2010 1834     Coagulation Profile: No results for input(s): INR, PROTIME in the last 168 hours.  Cardiac Enzymes: No results for input(s): CKTOTAL, CKMB, CKMBINDEX, TROPONINI in the last 168 hours.  HbA1C: Hgb A1c MFr Bld  Date/Time Value Ref Range Status  01/18/2020 05:25 AM 6.3 (H) 4.8 - 5.6 % Final    Comment:    (NOTE) Pre diabetes:          5.7%-6.4% Diabetes:              >6.4% Glycemic control for   <7.0% adults with diabetes     CBG: No results for input(s): GLUCAP in the last 168 hours.  Review of Systems:   Unable to  obtained as intubated, sedated  Past Medical History  She,  has a past medical history of Blurred vision, Cervicalgia, Diabetes mellitus without complication (HCC), Headache, Hypertension, and Numbness and tingling.   Surgical History    Past Surgical History:  Procedure Laterality Date  . CESAREAN SECTION    . MYOMECTOMY    . wisdoms teeth extracted       Social History   reports that she has been smoking cigarettes. She has a 15.00 pack-year smoking history. She has never used smokeless tobacco. She reports that she does not drink alcohol or use drugs.   Family History   Her family history includes Alcohol abuse in her paternal grandfather; Diabetes in her paternal grandmother; Gout in her paternal grandmother; Hypertension in her mother and paternal grandmother; Stroke in her paternal grandmother.   Allergies Allergies  Allergen Reactions  . Ibuprofen Itching     Home Medications  Prior to Admission medications   Medication Sig Start Date End Date Taking? Authorizing Provider  amLODipine (NORVASC) 10 MG tablet  Take 10 mg by mouth daily. 01/10/20  Yes [provider]  lisinopril (PRINIVIL,ZESTRIL) 40 MG tablet Take 40 mg by mouth daily.  08/31/14  Yes [provider]  methadone (DOLOPHINE) 1 mg/ml oral solution Take 27 mg/kg by mouth daily.   Yes [provider]  metoprolol (LOPRESSOR) 100 MG tablet Take 100 mg by mouth 2 (two) times daily.   Yes [provider]  olopatadine (PATANOL) 0.1 % ophthalmic solution Place 1 drop into both eyes 2 (two) times daily as needed (dry eyes).  01/02/20  Yes [provider]  solifenacin (VESICARE) 10 MG tablet Take 10 mg by mouth daily as needed (depends on fluid intake).  01/07/20  Yes [provider]  furosemide (LASIX) 20 MG tablet Take 2 tablets (40 mg total) by mouth daily for 3 days. Patient not taking: Reported on 01/18/2020 09/27/19 09/30/19  Darr, Marguerita Beards, PA-C  amitriptyline (ELAVIL) 25  MG tablet Take 1 tablet (25 mg total) by mouth at bedtime. Patient not taking: Reported on 11/19/2017 09/05/14 10/12/19  Penni Bombard, MD     This patient is critically ill with multiple organ system failure which requires frequent high complexity decision making, assessment, support, evaluation, and titration of therapies. This was completed through the application of advanced monitoring technologies and extensive interpretation of multiple databases. During this encounter critical care time was devoted to patient care services described in this note for 50 minutes.   Julian Hy, DO 01/18/20 2:41 PM [facility]

## 2020-01-18 NOTE — Progress Notes (Signed)
Pharmacy Note   A consult was received from an ED physician for Zosyn per pharmacy dosing.    The patient's profile has been reviewed for ht/wt/allergies/indication/available labs.    A one time order has been placed for Zosyn 3.375 gr IV x1 .  Further antibiotics/pharmacy consults should be ordered by admitting physician if indicated.                       Thank you,  Adalberto Cole, PharmD, BCPS 01/18/2020 7:08 AM

## 2020-01-18 NOTE — Anesthesia Procedure Notes (Signed)
Central Venous Catheter Insertion Performed by: Val Eagle, MD, anesthesiologist Start/End5/01/2020 10:38 AM, 01/18/2020 10:45 AM Patient location: OR. Preanesthetic checklist: patient identified, IV checked, site marked, risks and benefits discussed, surgical consent, monitors and equipment checked, pre-op evaluation, timeout performed and anesthesia consent Position: supine Patient sedated Hand hygiene performed  and maximum sterile barriers used  Catheter size: 8 Fr Total catheter length 16. Central line was placed.Double lumen Procedure performed using ultrasound guided technique. Ultrasound Notes:anatomy identified, needle tip was noted to be adjacent to the nerve/plexus identified, no ultrasound evidence of intravascular and/or intraneural injection and image(s) printed for medical record Attempts: 1 Following insertion, dressing applied, line sutured and Biopatch. Post procedure assessment: blood return through all ports, free fluid flow and no air  Patient tolerated the procedure well with no immediate complications.

## 2020-01-18 NOTE — Anesthesia Procedure Notes (Signed)
Procedure Name: Intubation Date/Time: 01/18/2020 10:33 AM Performed by: Montel Clock, CRNA Pre-anesthesia Checklist: Patient identified, Emergency Drugs available, Suction available, Patient being monitored and Timeout performed Patient Re-evaluated:Patient Re-evaluated prior to induction Oxygen Delivery Method: Circle system utilized Preoxygenation: Pre-oxygenation with 100% oxygen Induction Type: IV induction Ventilation: Mask ventilation without difficulty Laryngoscope Size: Mac and 3 Grade View: Grade I Tube type: Oral Tube size: 7.0 mm Number of attempts: 1 Airway Equipment and Method: Stylet Placement Confirmation: ETT inserted through vocal cords under direct vision,  positive ETCO2 and breath sounds checked- equal and bilateral Secured at: 21 cm Tube secured with: Tape Dental Injury: Teeth and Oropharynx as per pre-operative assessment

## 2020-01-18 NOTE — ED Notes (Signed)
Upon entering room. Patient stated that she was attempting to throw up and urinated on herself. Patient and floor cleaned, gown changed.

## 2020-01-18 NOTE — ED Notes (Signed)
Pt ambulated to the restroom.

## 2020-01-18 NOTE — ED Triage Notes (Signed)
Patient arrives via EMS from home with complaints of abdominal pain that she thinks is related to constipation. Patient states she has not had a bowel movement in 5 days. Patient had 1 episode of emesis after eating crab legs. States pain began 1 hour ago.

## 2020-01-18 NOTE — ED Provider Notes (Signed)
[facility]-EMERGENCY DEPT Provider Note   CSN: [id number] Arrival date & time: 01/18/20  5974     History Chief Complaint  Patient presents with  . Abdominal Pain  . Constipation    x5 days    Darlene Castillo is a 59 y.o. female with a history of tobacco use, hypertension, diabetes mellitus, and prior abdominal surgeries including C-section & myomectomy who presents to the ED with complaints of abdominal pain that began 2 hours PTA.  Patient states that she has been constipated for the past 5 days with minimal PO intake, her last bowel movement was fairly firm required straining, she has not been passing gas.  She states tonight she had acute onset of lower abdominal pain that is a 10 out of 10 in severity, no alleviating or aggravating factors, and associated with nausea and 3-4 episodes of emesis.  Denies fever, chills, hematemesis, melena, hematochezia, dysuria, vaginal bleeding, vaginal discharge, chest pain, or dyspnea.  HPI     Past Medical History:  Diagnosis Date  . Blurred vision   . Cervicalgia   . Diabetes mellitus without complication (HCC)    diet controlled borderline  . Headache   . Hypertension   . Numbness and tingling     There are no problems to display for this patient.   Past Surgical History:  Procedure Laterality Date  . CESAREAN SECTION    . MYOMECTOMY    . wisdoms teeth extracted       OB History   No obstetric history on file.     Family History  Problem Relation Age of Onset  . Hypertension Mother   . Stroke Paternal Grandmother   . Hypertension Paternal Grandmother   . Diabetes Paternal Grandmother   . Gout Paternal Grandmother   . Alcohol abuse Paternal Grandfather     Social History   Tobacco Use  . Smoking status: Current Every Day Smoker    Packs/day: 0.50    Years: 30.00    Pack years: 15.00    Types: Cigarettes  . Smokeless tobacco: Never Used  Substance Use Topics  . Alcohol use: No  . Drug use:  No    Home Medications Prior to Admission medications   Medication Sig Start Date End Date Taking? Authorizing Provider  amLODipine (NORVASC) 5 MG tablet Take 5 mg by mouth daily.    [provider]  furosemide (LASIX) 20 MG tablet Take 2 tablets (40 mg total) by mouth daily for 3 days. 09/27/19 09/30/19  Darr, Veryl Speak, PA-C  gabapentin (NEURONTIN) 100 MG capsule Take 100 mg by mouth 3 (three) times daily.    [provider]  hydrocortisone 2.5 % lotion Apply topically 2 (two) times daily. 05/06/14   Muthersbaugh, Dahlia Client, PA-C  lidocaine (LIDODERM) 5 % Place 1 patch onto the skin daily. Remove & Discard patch within 12 hours or as directed by MD 10/18/19   McDonald, Mia A, PA-C  lisinopril (PRINIVIL,ZESTRIL) 40 MG tablet Take 1 tablet by mouth daily. 08/31/14   [provider]  methocarbamol (ROBAXIN) 500 MG tablet Take 1 tablet (500 mg total) by mouth 2 (two) times daily. 10/18/19   McDonald, Mia A, PA-C  metoprolol (LOPRESSOR) 100 MG tablet Take 100 mg by mouth 2 (two) times daily.    [provider]  predniSONE (STERAPRED UNI-PAK 21 TAB) 10 MG (21) TBPK tablet Take by mouth daily. Take 6 tabs by mouth daily  for 2 days, then 5 tabs for 2  days, then 4 tabs for 2 days, then 3 tabs for 2 days, 2 tabs for 2 days, then 1 tab by mouth daily for 2 days 10/18/19   McDonald, Mia A, PA-C  amitriptyline (ELAVIL) 25 MG tablet Take 1 tablet (25 mg total) by mouth at bedtime. Patient not taking: Reported on 11/19/2017 09/05/14 10/12/19  Suanne Marker, MD    Allergies    Ibuprofen  Review of Systems   Review of Systems  Constitutional: Positive for appetite change. Negative for chills and fever.  Respiratory: Negative for cough and shortness of breath.   Cardiovascular: Negative for chest pain.  Gastrointestinal: Positive for abdominal pain, constipation, nausea and vomiting. Negative for anal bleeding, blood in stool and diarrhea.  Genitourinary: Negative for dysuria,  vaginal bleeding and vaginal discharge.  Neurological: Negative for syncope.  All other systems reviewed and are negative.   Physical Exam Updated Vital Signs BP (!) 151/74   Pulse 86   Temp 98.6 F (37 C) (Oral)   Resp (!) 29   LMP 05/23/2013   SpO2 94%   Physical Exam Vitals and nursing note reviewed.  Constitutional:      General: She is in acute distress (Patient pacing throughout exam room).     Appearance: She is well-developed. She is not toxic-appearing.  HENT:     Head: Normocephalic and atraumatic.  Eyes:     General:        Right eye: No discharge.        Left eye: No discharge.     Conjunctiva/sclera: Conjunctivae normal.  Cardiovascular:     Rate and Rhythm: Normal rate and regular rhythm.  Pulmonary:     Effort: Pulmonary effort is normal. No respiratory distress.     Breath sounds: Normal breath sounds. No wheezing, rhonchi or rales.  Abdominal:     General: There is distension.     Palpations: Abdomen is soft.     Tenderness: There is abdominal tenderness (generalized, worse in the lower abdomen). There is guarding.  Musculoskeletal:     Cervical back: Neck supple.  Skin:    General: Skin is warm and dry.     Findings: No rash.  Neurological:     Mental Status: She is alert.     Comments: Clear speech.   Psychiatric:        Behavior: Behavior normal.     ED Results / Procedures / Treatments   Labs (all labs ordered are listed, but only abnormal results are displayed) Labs Reviewed  COMPREHENSIVE METABOLIC PANEL - Abnormal; Notable for the following components:      Result Value   Potassium 2.5 (*)    Glucose, Bld 257 (*)    All other components within normal limits  URINALYSIS, ROUTINE W REFLEX MICROSCOPIC - Abnormal; Notable for the following components:   APPearance HAZY (*)    Glucose, UA 150 (*)    Ketones, ur 5 (*)    All other components within normal limits  LIPASE, BLOOD  CBC  MAGNESIUM    EKG EKG  Interpretation  Date/Time:  Wednesday Jan 18 2020 06:47:49 EDT Ventricular Rate:  86 PR Interval:    QRS Duration: 85 QT Interval:  407 QTC Calculation: 487 R Axis:   85 Text Interpretation: Sinus rhythm Consider left ventricular hypertrophy Borderline T abnormalities, inferior leads Borderline prolonged QT interval When compared with ECG of 10/17/2019, QT has lengthened Nonspecific T wave abnormality is now present Confirmed by Dione Booze ([id number]) on 01/18/2020  7:02:31 AM   Radiology CT Abdomen Pelvis W Contrast  Result Date: 01/18/2020 CLINICAL DATA:  Abdominal pain, constipation, emesis after eating crab legs, last BM 5 days prior EXAM: CT ABDOMEN AND PELVIS WITH CONTRAST TECHNIQUE: Multidetector CT imaging of the abdomen and pelvis was performed using the standard protocol following bolus administration of intravenous contrast. CONTRAST:  OMNIPAQUE IOHEXOL 300 MG/ML  SOLN COMPARISON:  CT abdomen and pelvis 07/10/1999 (report only) FINDINGS: Lower chest: There are diffuse atelectatic changes in the lung bases with some superimposed areas of or ground-glass opacity and possible consolidation. Cardiac size at the upper limits of normal. No pericardial effusion. Focal nodular thickening in the soft tissues of the right breast measuring up to 1.9 cm (2/9). Hepatobiliary: No focal liver lesion. Smooth liver surface contour. Normal liver attenuation. Mild gallbladder wall thickening. No visible calcified gallstones or biliary ductal dilatation. Small amount of fluid and gas surrounding the liver and extending into the gallbladder fossa, favor redistributed rather than primary. Pancreas: Mild pancreatic atrophy. No ductal dilatation or peripancreatic inflammation. Spleen: Normal in size without focal abnormality. Small amount of perisplenic fluid, likely redistributed. Adrenals/Urinary Tract: Normal adrenal glands. 6 cm fluid attenuation cyst arising from the upper pole right kidney. No concerning  features. No worrisome renal lesions or masses. Kidneys enhance and excrete symmetrically. No urolithiasis or hydronephrosis. Urinary bladder is largely decompressed at the time of exam and therefore poorly evaluated by CT imaging. Mild bladder thickening may be reactive. Stomach/Bowel: Small hiatal hernia. Stomach and duodenal sweep are unremarkable. There is diffuse mild edematous thickening of the small bowel with faint adjacent hazy stranding of the mesentery. A normal appendix is visualized. More pronounced pancolonic edematous mural thickening is present with a large colonic stool burden throughout the colon including more inspissated material towards the distal colon. There appears to be frank discontinuity along the left lateral wall of the distal sigmoid with spillage of some of this inspissated feculent material into the low pelvis and rectovaginal pouch with surrounding free fluid and large volume of extraluminal gas with more intense mural thickening towards the rectosigmoid. Vascular/Lymphatic: Extensive atherosclerotic plaque of the abdominal aorta and iliac arteries without aneurysm or ectasia. Minimal plaque within the branch vessels. Reactive adenopathy in the mesentery. No pathologically enlarged nodes. Reproductive: Anteverted uterus. No concerning adnexal lesions. Feculent material in the rectovaginal pouch and left adnexa. Other: Suspected spillage of inspissated feculent material and fluid into the abdomen and pelvis with more redistributed inflammatory fluid and free air in the upper abdomen. Musculoskeletal: No acute osseous abnormality or suspicious osseous lesion. IMPRESSION: 1. Suspected frank discontinuity along the left lateral wall of the distal sigmoid with spillage of some of this inspissated feculent material and fluid into the low pelvis and rectovaginal pouch with surrounding free fluid and large volume of extraluminal gas with more intense mural thickening towards the  rectosigmoid. 2. Diffuse large and small bowel thickening and perienteric stranding, more pronounced through the colon and most focally towards the rectosigmoid. Unclear how much may reflect a primary inflammatory process such as an enterocolitis versus reactive change. 3. Mild gallbladder wall thickening. Likely redistributed fluid and air in the gallbladder fossa. Favor reactive change given the presence of more significant findings in the abdomen and pelvis though should correlate with clinical exam findings. 4. Focal nodular thickening in the soft tissues of the right breast measuring up to 1.9 cm. Recommend correlation with mammography. 5. Mild bladder wall thickening, favor reactive. 6. Small hiatal hernia. 7. Questionable  airspace disease upon atelectatic changes in lung bases. Could reflect infection including atypical viral etiologies. 8. Aortic Atherosclerosis (ICD10-I70.0). Electronically Signed   By: Kreg ShropshirePrice  DeHay M.D.   On: 01/18/2020 07:00    Procedures .Critical Care Performed by: Cherly AndersonPetrucelli, Darric Plante R, PA-C Authorized by: Cherly AndersonPetrucelli, Tylin Force R, PA-C    CRITICAL CARE Performed by: Harvie HeckSamantha Bulmaro Feagans   Total critical care time: 45 minutes  Critical care time was exclusive of separately billable procedures and treating other patients.  Critical care was necessary to treat or prevent imminent or life-threatening deterioration.  Critical care was time spent personally by me on the following activities: development of treatment plan with patient and/or surrogate as well as nursing, discussions with consultants, evaluation of patient's response to treatment, examination of patient, obtaining history from patient or surrogate, ordering and performing treatments and interventions, ordering and review of laboratory studies, ordering and review of radiographic studies, pulse oximetry and re-evaluation of patient's condition.   (including critical care time)  Medications Ordered in  ED Medications  sodium chloride flush (NS) 0.9 % injection 3 mL (has no administration in time range)  potassium chloride 10 mEq in 100 mL IVPB (has no administration in time range)  sodium chloride 0.9 % bolus 1,000 mL (has no administration in time range)  HYDROmorphone (DILAUDID) injection 0.5 mg (has no administration in time range)  ondansetron (ZOFRAN-ODT) disintegrating tablet 4 mg (4 mg Oral Given 01/18/20 0345)  sodium chloride 0.9 % bolus 1,000 mL (1,000 mLs Intravenous New Bag/Given 01/18/20 0538)  morphine 4 MG/ML injection 4 mg (4 mg Intramuscular Given 01/18/20 0408)  HYDROmorphone (DILAUDID) injection 1 mg (1 mg Intramuscular Given 01/18/20 0505)  ondansetron (ZOFRAN) injection 4 mg (4 mg Intravenous Given 01/18/20 0537)  iohexol (OMNIPAQUE) 300 MG/ML solution 100 mL (100 mLs Intravenous Contrast Given 01/18/20 [id number])    ED Course  I have reviewed the triage vital signs and the nursing notes.  Pertinent labs & imaging results that were available during my care of the patient were reviewed by me and considered in my medical decision making (see chart for details).    MDM Rules/Calculators/A&P                      Patient presents to the ED with complaints of abdominal pain with N/V that began shortly PTA with constipation x 5 days. Patient appears uncomfortable, pacing throughout exam room. She has mild abdominal distension noted with guarding and generalized tenderness that is worse in the lower abdomen. DDx: obstruction, constipation, perforation, diverticulitis, colitis, appendicitis, pancreatitis, intra-abdominal mass or abscess, viral GI illness.  Additional history obtained:  Additional history obtained from EMS to triage team. Previous records obtained and reviewed.  Lab Tests:  I Ordered, reviewed, and interpreted labs, which included:  Urinalysis: No UTI, mild ketonuria/glucosuria.  CBC: No anemia or leukocytosis.  CMP: Hypokalemia at 2.5.  Hyperglycemia without acidosis or  anion gap elevation.  LFTs WNL. Lipase: WNL. Magnesium: WNL  Imaging Studies ordered:  I ordered imaging studies which included CT abdomen/pelvis, I have personally reviewed & interpreted imaging:  1. Suspected frank discontinuity along the left lateral wall of the distal sigmoid with spillage of some of this inspissated feculent material and fluid into the low pelvis and rectovaginal pouch with surrounding free fluid and large volume of extraluminal gas with more intense mural thickening towards the rectosigmoid. 2. Diffuse large and small bowel thickening and perienteric stranding, more pronounced through the colon and most focally towards the  rectosigmoid. Unclear how much may reflect a primary inflammatory process such as an enterocolitis versus reactive change. 3. Mild gallbladder wall thickening. Likely redistributed fluid and air in the gallbladder fossa. Favor reactive change given the presence of more significant findings in the abdomen and pelvis though should correlate with clinical exam findings. 4. Focal nodular thickening in the soft tissues of the right breast measuring up to 1.9 cm. Recommend correlation with mammography. 5. Mild bladder wall thickening, favor reactive. 6. Small hiatal hernia. 7. Questionable airspace disease upon atelectatic changes in lung bases. Could reflect infection including atypical viral etiologies. 8. Aortic Atherosclerosis  ED Course:  04:00: On initial assessment of the patient she is pacing throughout the exam room.  She is unable to sit still long enough for nursing staff to establish IV access.  Will give ODT Zofran and IM morphine.  05:00: Patient with mild pain relief from morphine, but seems to be worsening again, 1mg  IM dilaudid ordered. Multiple IV attempts by nursing staff, unable to establish access, IV team has been consulted.  05:08: IV team at bedside 05:30: IV access established, patient with some increased nausea, re-dose zofran IV.  06:12: K  critical @ 2.5- ordered EKG (QT lengthened compared to prior), magnesium level, and IV potassium supplementation with maintenance fluids.  06:55: CONSULT: Discussed with radiologist Dr. Felipa Emory concern for perforation, suspected discontinuity at the rectosigmoid with frank spillage noted on CT imaging.--> Covid testing ordered, will start Zosyn, consult placed to general surgery. 06:58: RE-EVAL: Patient with continued pain, additional dilaudid ordered. Updated on results & plan of care at this time.  07:13: CONSULT: Discussed with Margie Billet PA-C with general surgery, will come see patient in the ED, potentially admit to their service. Requesting add on UDS which has been ordered. Appreciate consultation.   Patient care signed out to Cortni Couture PA-C at change of shift pending general surgery assessment.   Findings and plan of care discussed with supervising physician Dr. Roxanne Mins who is in agreement.    Portions of this note were generated with [employer]. Dictation errors may occur despite best attempts at proofreading.  Final Clinical Impression(s) / ED Diagnoses Final diagnoses:  Lower abdominal pain  Perforation of sigmoid colon [facility])    Rx / DC Orders ED Discharge Orders    None       Amaryllis Dyke, PA-C 96/04/54 [id number]    Delora Fuel, MD 19/14/78 [phone]

## 2020-01-18 NOTE — Progress Notes (Signed)
eLink Physician-Brief Progress Note Patient Name: Darlene Castillo DOB: 27-Aug-1961 MRN: [id number]   Date of Service  01/18/2020  HPI/Events of Note  Patient has Foley catheter from OR. No order for Foley catheter.   eICU Interventions  Will order: 1. Place Foley catheter.     Intervention Category Major Interventions: Other:  [name] 01/18/2020, 8:59 PM

## 2020-01-18 NOTE — H&P (Signed)
[facility] Admission Note  Darlene Castillo June 13, 1961  [id number].    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 States that she had been constipated and had poor PO intake for 5 days. Late last night her pain became worse in her lower abdomen. Pain is now diffuse, constant, severe, and worse with palpation. Associated symptoms include nausea and multiple episodes emesis. She is pacing around the room.  ED work up included CT scan which shows suspected frank discontinuity along the left lateral wall of the distal sigmoid with spillage of some of this inspissated feculent material and fluid into the low pelvis and rectovaginal pouch with surrounding free fluid and large volume of extraluminal gas with more intense mural thickening towards the rectosigmoid. WBC 8.8, afebrile.  General surgery asked to see.  She has had nothing to eat/drink today.  Abdominal surgical history: c section, myomectomy  Never had a colonoscopy Anticoagulants: none Smokes about 5 cigarettes per day Employment: daycare  Review of Systems  Constitutional: Negative.   HENT: Negative.   Eyes: Negative.   Respiratory: Negative.   Cardiovascular: Negative.   Gastrointestinal: Positive for abdominal pain, constipation, nausea and vomiting.  Genitourinary: Negative.   Musculoskeletal: Negative.   Skin: Negative.   Neurological: Negative.    All systems reviewed and otherwise negative except for as above  Family History  Problem Relation Age of Onset  . Hypertension Mother   . Stroke Paternal Grandmother   . Hypertension Paternal Grandmother   . Diabetes Paternal Grandmother   . Gout Paternal Grandmother   . Alcohol abuse Paternal Grandfather     Past Medical  History:  Diagnosis Date  . Blurred vision   . Cervicalgia   . Diabetes mellitus without complication (HCC)    diet controlled borderline  . Headache   . Hypertension   . Numbness and tingling     Past Surgical History:  Procedure Laterality Date  . CESAREAN SECTION    . MYOMECTOMY    . wisdoms teeth extracted      Social History:  reports that she has been smoking cigarettes. She has a 15.00 pack-year smoking history. She has never used smokeless tobacco. She reports that she does not drink alcohol or use drugs.  Allergies:  Allergies  Allergen Reactions  . Ibuprofen Itching    (Not in a hospital admission)   Prior to Admission medications   Medication Sig Start Date End Date Taking? Authorizing Provider  amLODipine (NORVASC) 10 MG tablet Take 10 mg by mouth daily. 01/10/20   [provider]  amLODipine (NORVASC) 5 MG tablet Take 5 mg by mouth daily.    [provider]  furosemide (LASIX) 20 MG tablet Take 2 tablets (40 mg total) by mouth daily for 3 days. 09/27/19 09/30/19  Darr, Veryl Speak, PA-C  gabapentin (NEURONTIN) 100 MG capsule Take 100 mg by mouth 3 (three) times daily.    [provider]  hydrocortisone 2.5 % lotion Apply topically 2 (two) times daily. 05/06/14   Muthersbaugh, Dahlia Client, PA-C  lidocaine (LIDODERM) 5 % Place 1 patch onto the skin daily. Remove & Discard patch within 12 hours or as directed by MD 10/18/19   McDonald, Mia A, PA-C  lisinopril (PRINIVIL,ZESTRIL) 40 MG tablet Take 1 tablet  by mouth daily. 08/31/14   [provider]  methocarbamol (ROBAXIN) 500 MG tablet Take 1 tablet (500 mg total) by mouth 2 (two) times daily. 10/18/19   McDonald, Mia A, PA-C  metoprolol (LOPRESSOR) 100 MG tablet Take 100 mg by mouth 2 (two) times daily.    [provider]  olopatadine (PATANOL) 0.1 % ophthalmic solution Place 1 drop into both eyes 2 (two) times daily. 01/02/20   [provider]  predniSONE (STERAPRED UNI-PAK 21  TAB) 10 MG (21) TBPK tablet Take by mouth daily. Take 6 tabs by mouth daily  for 2 days, then 5 tabs for 2 days, then 4 tabs for 2 days, then 3 tabs for 2 days, 2 tabs for 2 days, then 1 tab by mouth daily for 2 days 10/18/19   McDonald, Mia A, PA-C  solifenacin (VESICARE) 10 MG tablet Take 10 mg by mouth daily. 01/07/20   [provider]  amitriptyline (ELAVIL) 25 MG tablet Take 1 tablet (25 mg total) by mouth at bedtime. Patient not taking: Reported on 11/19/2017 09/05/14 10/12/19  Penumalli, Earlean Polka, MD    Blood pressure (!) 151/74, pulse 86, temperature 98.6 F (37 C), temperature source Oral, resp. rate (!) 29, last menstrual period 05/23/2013, SpO2 94 %. Physical Exam: General: WD/WN black female who pacing around the room in pain HEENT: head is normocephalic, atraumatic.  Sclera are noninjected.  PERRL.  Ears and nose without any masses or lesions.  Mouth is pink and moist. Dentition fair Heart: regular, rate, and rhythm.  Normal s1,s2. No obvious murmurs, gallops, or rubs noted.  Palpable pedal pulses bilaterally  Lungs: CTAB, no wheezes, rhonchi, or rales noted.  Respiratory effort nonlabored Abd: distended, diffusely tender with guarding and peritonitis, hypoactive BS, no masses, hernias, or organomegaly MS: no BUE/BLE edema, calves soft and nontender Skin: warm and dry with no masses, lesions, or rashes Psych: A&Ox4 with an appropriate affect Neuro: cranial nerves grossly intact, equal strength in BUE/BLE bilaterally, normal speech, thought process intact  Results for orders placed or performed during the hospital encounter of 01/18/20 (from the past 48 hour(s))  Urinalysis, Routine w reflex microscopic     Status: Abnormal   Collection Time: 01/18/20  3:33 AM  Result Value Ref Range   Color, Urine YELLOW YELLOW   APPearance HAZY (A) CLEAR   Specific Gravity, Urine 1.015 1.005 - 1.030   pH 5.0 5.0 - 8.0   Glucose, UA 150 (A) NEGATIVE mg/dL   Hgb urine dipstick NEGATIVE  NEGATIVE   Bilirubin Urine NEGATIVE NEGATIVE   Ketones, ur 5 (A) NEGATIVE mg/dL   Protein, ur NEGATIVE NEGATIVE mg/dL   Nitrite NEGATIVE NEGATIVE   Leukocytes,Ua NEGATIVE NEGATIVE    Comment: Performed at [facility], [place] [address] [place], [place] [address]  Lipase, blood     Status: None   Collection Time: 01/18/20  5:25 AM  Result Value Ref Range   Lipase 19 11 - 51 U/L    Comment: Performed at [facility], [place] [address], [place], [place] [address]  Comprehensive metabolic panel     Status: Abnormal   Collection Time: 01/18/20  5:25 AM  Result Value Ref Range   Sodium 141 135 - 145 mmol/L   Potassium 2.5 (LL) 3.5 - 5.1 mmol/L    Comment: CRITICAL RESULT CALLED TO, READ BACK BY AND VERIFIED WITH: DOSTER,S @ 0611 ON [id number] BY POTEAT,S    Chloride 101 98 - 111 mmol/L  CO2 26 22 - 32 mmol/L   Glucose, Bld 257 (H) 70 - 99 mg/dL    Comment: Glucose reference range applies only to samples taken after fasting for at least 8 hours.   BUN 17 6 - 20 mg/dL   Creatinine, Ser 9.62 0.44 - 1.00 mg/dL   Calcium 9.0 8.9 - 22.9 mg/dL   Total Protein 7.9 6.5 - 8.1 g/dL   Albumin 4.5 3.5 - 5.0 g/dL   AST 26 15 - 41 U/L   ALT 14 0 - 44 U/L   Alkaline Phosphatase 66 38 - 126 U/L   Total Bilirubin 0.4 0.3 - 1.2 mg/dL   GFR calc non Af Amer >60 >60 mL/min   GFR calc Af Amer >60 >60 mL/min   Anion gap 14 5 - 15    Comment: Performed at [facility], [address] [place], [place] [address]  CBC     Status: None   Collection Time: 01/18/20  5:25 AM  Result Value Ref Range   WBC 8.8 4.0 - 10.5 K/uL   RBC 4.64 3.87 - 5.11 MIL/uL   Hemoglobin 12.6 12.0 - 15.0 g/dL   HCT 11.9 41.7 - 40.8 %   MCV 88.6 80.0 - 100.0 fL   MCH 27.2 26.0 - 34.0 pg   MCHC 30.7 30.0 - 36.0 g/dL   RDW 14.4 81.8 - 56.3 %   Platelets 280 150 - 400 K/uL   nRBC 0.0 0.0 - 0.2 %    Comment: Performed at [facility], [address],  [place], [place] [address]  Magnesium     Status: None   Collection Time: 01/18/20  5:25 AM  Result Value Ref Range   Magnesium 2.2 1.7 - 2.4 mg/dL    Comment: Performed at [facility], [address] [place], [place] [address]   CT Abdomen Pelvis W Contrast  Result Date: 01/18/2020 CLINICAL DATA:  Abdominal pain, constipation, emesis after eating crab legs, last BM 5 days prior EXAM: CT ABDOMEN AND PELVIS WITH CONTRAST TECHNIQUE: Multidetector CT imaging of the abdomen and pelvis was performed using the standard protocol following bolus administration of intravenous contrast. CONTRAST:  OMNIPAQUE IOHEXOL 300 MG/ML  SOLN COMPARISON:  CT abdomen and pelvis 07/10/1999 (report only) FINDINGS: Lower chest: There are diffuse atelectatic changes in the lung bases with some superimposed areas of or ground-glass opacity and possible consolidation. Cardiac size at the upper limits of normal. No pericardial effusion. Focal nodular thickening in the soft tissues of the right breast measuring up to 1.9 cm (2/9). Hepatobiliary: No focal liver lesion. Smooth liver surface contour. Normal liver attenuation. Mild gallbladder wall thickening. No visible calcified gallstones or biliary ductal dilatation. Small amount of fluid and gas surrounding the liver and extending into the gallbladder fossa, favor redistributed rather than primary. Pancreas: Mild pancreatic atrophy. No ductal dilatation or peripancreatic inflammation. Spleen: Normal in size without focal abnormality. Small amount of perisplenic fluid, likely redistributed. Adrenals/Urinary Tract: Normal adrenal glands. 6 cm fluid attenuation cyst arising from the upper pole right kidney. No concerning features. No worrisome renal lesions or masses. Kidneys enhance and excrete symmetrically. No urolithiasis or hydronephrosis. Urinary bladder is largely decompressed at the time of exam and therefore poorly evaluated by CT imaging. Mild bladder  thickening may be reactive. Stomach/Bowel: Small hiatal hernia. Stomach and duodenal sweep are unremarkable. There is diffuse mild edematous thickening of the small bowel with faint adjacent hazy stranding of the mesentery. A  normal appendix is visualized. More pronounced pancolonic edematous mural thickening is present with a large colonic stool burden throughout the colon including more inspissated material towards the distal colon. There appears to be frank discontinuity along the left lateral wall of the distal sigmoid with spillage of some of this inspissated feculent material into the low pelvis and rectovaginal pouch with surrounding free fluid and large volume of extraluminal gas with more intense mural thickening towards the rectosigmoid. Vascular/Lymphatic: Extensive atherosclerotic plaque of the abdominal aorta and iliac arteries without aneurysm or ectasia. Minimal plaque within the branch vessels. Reactive adenopathy in the mesentery. No pathologically enlarged nodes. Reproductive: Anteverted uterus. No concerning adnexal lesions. Feculent material in the rectovaginal pouch and left adnexa. Other: Suspected spillage of inspissated feculent material and fluid into the abdomen and pelvis with more redistributed inflammatory fluid and free air in the upper abdomen. Musculoskeletal: No acute osseous abnormality or suspicious osseous lesion. IMPRESSION: 1. Suspected frank discontinuity along the left lateral wall of the distal sigmoid with spillage of some of this inspissated feculent material and fluid into the low pelvis and rectovaginal pouch with surrounding free fluid and large volume of extraluminal gas with more intense mural thickening towards the rectosigmoid. 2. Diffuse large and small bowel thickening and perienteric stranding, more pronounced through the colon and most focally towards the rectosigmoid. Unclear how much may reflect a primary inflammatory process such as an enterocolitis versus  reactive change. 3. Mild gallbladder wall thickening. Likely redistributed fluid and air in the gallbladder fossa. Favor reactive change given the presence of more significant findings in the abdomen and pelvis though should correlate with clinical exam findings. 4. Focal nodular thickening in the soft tissues of the right breast measuring up to 1.9 cm. Recommend correlation with mammography. 5. Mild bladder wall thickening, favor reactive. 6. Small hiatal hernia. 7. Questionable airspace disease upon atelectatic changes in lung bases. Could reflect infection including atypical viral etiologies. 8. Aortic Atherosclerosis (ICD10-I70.0). Electronically Signed   By: Kreg Shropshire M.D.   On: 01/18/2020 07:00      Assessment/Plan HTN ?DM - check A1c Tobacco abuse Hypokalemia - K2.5, being replaced Prior h/o heroin abuse at least 15 years ago, on methadone Constipation  Pneumoperitoneum  - Suspected colon perforation. Will take patient for exploratory laparotomy, partial colectomy, colostomy today. Keep NPO. IV zosyn has been started. Covid test pending.  ID - zosyn VTE - SCDs, lovenox FEN - IVF, NPO Foley - none Follow up - TBD  Franne Forts, [facility] 01/18/2020, 7:20 AM Please see Amion for pager number during day hours 7:00am-4:30pm

## 2020-01-18 NOTE — ED Notes (Signed)
This RN and Alinda Money, RN attempted to stick patient. Patient difficult stick due to discomfort, patient unable to sit still. Sam, PA made aware. Pain medicine to be given IM for patient comfort.

## 2020-01-18 NOTE — Progress Notes (Signed)
Pharmacy Antibiotic Note  Darlene Castillo is a 59 y.o. female admitted on 01/18/2020 with sigmoid colon perforation.  Pharmacy has been consulted for zosyn dosing.  Plan: Zosyn 3.375g IV q8h (4 hour infusion).     Temp (24hrs), Avg:98.3 F (36.8 C), Min:97.9 F (36.6 C), Max:98.6 F (37 C)  Recent Labs  Lab 01/18/20 0525  WBC 8.8  CREATININE 0.89    CrCl cannot be calculated (Unknown ideal weight.).    Allergies  Allergen Reactions  . Ibuprofen Itching    Antimicrobials this admission: 5/5 zosyn>> Dose adjustments this admission:  Microbiology results: 5/5 flu/covid: neg  Thank you for allowing pharmacy to be a part of this patient's care.  Herby Abraham, Pharm.D [phone] 01/18/2020 1:17 PM

## 2020-01-18 NOTE — Transfer of Care (Signed)
Immediate Anesthesia Transfer of Care Note  Patient: Darlene Castillo  Procedure(s) Performed: EXPLORATORY LAPAROTOMY SIGMOID RESECTION AND COLOSTOMY (N/A Abdomen)  Patient Location: ICU  Anesthesia Type:General  Level of Consciousness: Patient remains intubated per anesthesia plan  Airway & Oxygen Therapy: Patient placed on Ventilator (see vital sign flow sheet for setting)  Post-op Assessment: Report given to RN and Post -op Vital signs reviewed and stable  Post vital signs: Reviewed and stable  Last Vitals:  Vitals Value Taken Time  BP    Temp    Pulse 82 01/18/20 1302  Resp 20 01/18/20 1302  SpO2 95 % 01/18/20 1302  Vitals shown include unvalidated device data.  Last Pain:  Vitals:   01/18/20 0926  TempSrc: Oral  PainSc:          Complications: No apparent anesthesia complications

## 2020-01-18 NOTE — Op Note (Signed)
Preoperative diagnosis: Sigmoid colon perforation secondary to chronic constipation and ulceration with feculent peritonitis and gross contamination of abdominal cavity with stool balls septic shock  Postoperative diagnosis: Same with significant fecal contamination class IV wound with free  stool balls floating throughout the abdominal cavity causing severe widespread peritonitis with septic shock  Procedure: Exploratory laparotomy with partial colectomy and end colostomy  Surgeon: Erroll Luna, MD  Assistant: Theodis Sato    Drains: None  Specimen: Sigmoid colon with perforation to pathology with multiple stool balls  EBL: 30 cc  IV fluids: Per anesthesia record  Indications for procedure: Patient presents emergently to the operating room after being seen this morning the emergency room with abdominal pain.  CT scan showed perforation of her sigmoid colon due to chronic constipation and questionable stercoral ulceration.  She had severe peritonitis and was brought emergently to the operative room.  Risk, benefits and the need for colostomy were discussed extensively with the patient as well as significant complication of infection postop, septic shock, death, DVT, bowel loss, multiple operations, and the need further surgery to correct colostomy takedown if stable.  Possible mortality of 15% in this circumstance.  No medical options available.The procedure has been discussed with the patient.  Alternative therapies have been discussed with the patient.  Operative risks include bleeding,  Infection,  Organ injury,  Nerve injury,  Blood vessel injury,  DVT,  Pulmonary embolism,  Death,  And possible reoperation.  Medical management risks include worsening of present situation.  The success of the procedure is 50 -90 % at treating patients symptoms.  The patient understands and agrees to proceed.   Description of procedure: The patient was seen in the holding area.  Questions were answered.   She was taken back to the operating.  After induction of general esthesia she was placed on the operating table supine.  The anesthesiologist placed central lines and a lines for monitoring.  Her abdomen is then prepped and draped sterile fashion and Foley catheter was placed sterilely.  Timeout was done. Incision was done.  Dissection was carried to the midline linea alba into the abdominal cavity.  Upon entering there was gross fecal fluid gushing out of the abdominal cavity.  She had severe peritonitis.  There is a least a liter of feculent material circulating throughout the abdominal cavity.  Retractor was placed.  She had a large perforation of her distal sigmoid colon.  I was able to fire stapling device proximal to this in the distal to the perforation control.  I then used the LigaSure to take the mesentery down to the distal sigmoid colon.  2-0 Prolene was placed in the rectal stump.  The specimen was passed off the field.  I then irrigated the cavity with 10 L of crystalloid since there was gross fecal contamination and free-floating stool balls throughout the abdominal cavity.  After irrigation around the small bowel looking for any evidence of injury and there is none.  The cecum was full stool as well as a sitting, transverse, descending and remainder of sigmoid colon.  There is also significant stool in the rectal vault.  There is no signs of ischemia or perforation.  Through the left lower quadrant circular incision the colon was brought out as a colostomy.  Prior to this I mobilized along the white line of Toldt for better length.  We then finished irrigating.  All sponges were counted and found to be correct.  Retractors removed.  The fascia was closed  with #1 PDS.  The colostomy matured with 3-0 Vicryl.  The wound was packed open.  Dry dressings applied.  All counts were found to be correct.  Patient was taken to the ICU in critical but stable condition intubated.

## 2020-01-18 NOTE — ED Notes (Signed)
Contact [phone] sister/Samaiha

## 2020-01-18 NOTE — Anesthesia Procedure Notes (Signed)
Arterial Line Insertion Start/End5/01/2020 10:54 AM, 01/18/2020 11:01 AM Performed by: Val Eagle, MD, anesthesiologist  Patient location: OR. Preanesthetic checklist: patient identified, IV checked, site marked, risks and benefits discussed, surgical consent, monitors and equipment checked, pre-op evaluation, timeout performed and anesthesia consent Patient sedated Left, radial was placed Catheter size: 20 G Hand hygiene performed  and maximum sterile barriers used   Attempts: 1 Procedure performed without using ultrasound guided technique. Following insertion, dressing applied and Biopatch. Post procedure assessment: normal and unchanged  Patient tolerated the procedure well with no immediate complications.

## 2020-01-18 NOTE — ED Notes (Signed)
Patient transported to CT 

## 2020-01-19 ENCOUNTER — Inpatient Hospital Stay: Payer: Self-pay

## 2020-01-19 ENCOUNTER — Encounter: Payer: Self-pay | Admitting: *Deleted

## 2020-01-19 DIAGNOSIS — R739 Hyperglycemia, unspecified: Secondary | ICD-10-CM

## 2020-01-19 LAB — LACTIC ACID, PLASMA
Lactic Acid, Venous: 2.8 mmol/L (ref 0.5–1.9)
Lactic Acid, Venous: 2.1 mmol/L (ref 0.5–1.9)

## 2020-01-19 LAB — BLOOD GAS, ARTERIAL
Acid-base deficit: 6.7 mmol/L — ABNORMAL HIGH (ref 0.0–2.0)
Acid-base deficit: 6 mmol/L — ABNORMAL HIGH (ref 0.0–2.0)
Bicarbonate: 16.9 mmol/L — ABNORMAL LOW (ref 20.0–28.0)
Bicarbonate: 17.4 mmol/L — ABNORMAL LOW (ref 20.0–28.0)
FIO2: 60
FIO2: 60
O2 Saturation: 95.4 %
O2 Saturation: 97.9 %
Patient temperature: 100
Patient temperature: 99
pCO2 arterial: 30.6 mmHg — ABNORMAL LOW (ref 32.0–48.0)
pCO2 arterial: 29.4 mmHg — ABNORMAL LOW (ref 32.0–48.0)
pH, Arterial: 7.364 (ref 7.350–7.450)
pH, Arterial: 7.39 (ref 7.350–7.450)
pO2, Arterial: 80.8 mmHg — ABNORMAL LOW (ref 83.0–108.0)
pO2, Arterial: 98.3 mmHg (ref 83.0–108.0)

## 2020-01-19 LAB — PHOSPHORUS
Phosphorus: 1.6 mg/dL — ABNORMAL LOW (ref 2.5–4.6)
Phosphorus: 4.3 mg/dL (ref 2.5–4.6)

## 2020-01-19 LAB — CBC
HCT: 38.1 % (ref 36.0–46.0)
Hemoglobin: 12.5 g/dL (ref 12.0–15.0)
MCH: 27.7 pg (ref 26.0–34.0)
MCHC: 32.8 g/dL (ref 30.0–36.0)
MCV: 84.5 fL (ref 80.0–100.0)
Platelets: 217 10*3/uL (ref 150–400)
RBC: 4.51 MIL/uL (ref 3.87–5.11)
RDW: 13.1 % (ref 11.5–15.5)
WBC: 2.5 10*3/uL — ABNORMAL LOW (ref 4.0–10.5)
nRBC: 0 % (ref 0.0–0.2)

## 2020-01-19 LAB — BASIC METABOLIC PANEL
Anion gap: 9 (ref 5–15)
Anion gap: 8 (ref 5–15)
BUN: 18 mg/dL (ref 6–20)
BUN: 16 mg/dL (ref 6–20)
CO2: 18 mmol/L — ABNORMAL LOW (ref 22–32)
CO2: 20 mmol/L — ABNORMAL LOW (ref 22–32)
Calcium: 7.3 mg/dL — ABNORMAL LOW (ref 8.9–10.3)
Calcium: 7 mg/dL — ABNORMAL LOW (ref 8.9–10.3)
Chloride: 115 mmol/L — ABNORMAL HIGH (ref 98–111)
Chloride: 112 mmol/L — ABNORMAL HIGH (ref 98–111)
Creatinine, Ser: 0.96 mg/dL (ref 0.44–1.00)
Creatinine, Ser: 1.03 mg/dL — ABNORMAL HIGH (ref 0.44–1.00)
GFR calc Af Amer: >60 mL/min (ref >60)
GFR calc Af Amer: >60 mL/min (ref >60)
GFR calc non Af Amer: >60 mL/min (ref >60)
GFR calc non Af Amer: 59 mL/min — ABNORMAL LOW (ref >60)
Glucose, Bld: 181 mg/dL — ABNORMAL HIGH (ref 70–99)
Glucose, Bld: 162 mg/dL — ABNORMAL HIGH (ref 70–99)
Potassium: 3.6 mmol/L (ref 3.5–5.1)
Potassium: 4.1 mmol/L (ref 3.5–5.1)
Sodium: 142 mmol/L (ref 135–145)
Sodium: 140 mmol/L (ref 135–145)

## 2020-01-19 LAB — GLUCOSE, CAPILLARY
Glucose-Capillary: 159 mg/dL — ABNORMAL HIGH (ref 70–99)
Glucose-Capillary: 155 mg/dL — ABNORMAL HIGH (ref 70–99)
Glucose-Capillary: 134 mg/dL — ABNORMAL HIGH (ref 70–99)
Glucose-Capillary: 162 mg/dL — ABNORMAL HIGH (ref 70–99)
Glucose-Capillary: 144 mg/dL — ABNORMAL HIGH (ref 70–99)
Glucose-Capillary: 166 mg/dL — ABNORMAL HIGH (ref 70–99)

## 2020-01-19 LAB — PREALBUMIN: Prealbumin: 9.7 mg/dL — ABNORMAL LOW (ref 18–38)

## 2020-01-19 LAB — SURGICAL PATHOLOGY

## 2020-01-19 LAB — HEPATIC FUNCTION PANEL
ALT: 13 U/L (ref 0–44)
AST: 28 U/L (ref 15–41)
Albumin: 2.2 g/dL — ABNORMAL LOW (ref 3.5–5.0)
Alkaline Phosphatase: 24 U/L — ABNORMAL LOW (ref 38–126)
Bilirubin, Direct: 0.2 mg/dL (ref 0.0–0.2)
Indirect Bilirubin: 0.6 mg/dL (ref 0.3–0.9)
Total Bilirubin: 0.8 mg/dL (ref 0.3–1.2)
Total Protein: 3.9 g/dL — ABNORMAL LOW (ref 6.5–8.1)

## 2020-01-19 LAB — MAGNESIUM
Magnesium: 1.5 mg/dL — ABNORMAL LOW (ref 1.7–2.4)
Magnesium: 2.1 mg/dL (ref 1.7–2.4)

## 2020-01-19 LAB — TRIGLYCERIDES: Triglycerides: 27 mg/dL (ref <150)

## 2020-01-19 MED ORDER — METHADONE HCL 10 MG/ML IJ SOLN
7.5000 mg | Freq: Four times a day (QID) | INTRAMUSCULAR | Status: DC
  Administered 2020-01-19 – 2020-01-25 (×24): 7.5 mg via INTRAVENOUS
  Filled 2020-01-19 (×29): qty 0.75

## 2020-01-19 MED ORDER — POTASSIUM PHOSPHATES 15 MMOLE/5ML IV SOLN
30.0000 mmol | Freq: Once | INTRAVENOUS | Status: AC
  Administered 2020-01-19: 30 mmol via INTRAVENOUS
  Filled 2020-01-19: qty 10

## 2020-01-19 MED ORDER — NOREPINEPHRINE 16 MG/250ML-% IV SOLN
0.0000 ug/min | INTRAVENOUS | Status: DC
  Administered 2020-01-19: 20 ug/min via INTRAVENOUS
  Administered 2020-01-20: 2 ug/min via INTRAVENOUS
  Filled 2020-01-19 (×4): qty 250

## 2020-01-19 MED ORDER — METHADONE HCL 10 MG/ML IJ SOLN
7.5000 mg | Freq: Four times a day (QID) | INTRAMUSCULAR | Status: DC
  Filled 2020-01-19: qty 0.75

## 2020-01-19 MED ORDER — SODIUM CHLORIDE 0.9 % IV BOLUS
500.0000 mL | Freq: Once | INTRAVENOUS | Status: AC
  Administered 2020-01-19: 500 mL via INTRAVENOUS

## 2020-01-19 MED ORDER — MAGNESIUM SULFATE 2 GM/50ML IV SOLN
2.0000 g | Freq: Once | INTRAVENOUS | Status: AC
  Administered 2020-01-19: 2 g via INTRAVENOUS
  Filled 2020-01-19: qty 50

## 2020-01-19 MED ORDER — TRAVASOL 10 % IV SOLN
INTRAVENOUS | Status: AC
  Filled 2020-01-19: qty 468

## 2020-01-19 MED ORDER — SODIUM CHLORIDE 0.9 % IV BOLUS
1000.0000 mL | Freq: Once | INTRAVENOUS | Status: AC
  Administered 2020-01-19: 1000 mL via INTRAVENOUS

## 2020-01-19 MED ORDER — DEXMEDETOMIDINE HCL IN NACL 200 MCG/50ML IV SOLN
0.4000 ug/kg/h | INTRAVENOUS | Status: DC
  Administered 2020-01-19: 0.5 ug/kg/h via INTRAVENOUS
  Administered 2020-01-19: 0.4 ug/kg/h via INTRAVENOUS
  Administered 2020-01-19: 0.5 ug/kg/h via INTRAVENOUS
  Administered 2020-01-20 (×2): 0.6 ug/kg/h via INTRAVENOUS
  Administered 2020-01-20: 1 ug/kg/h via INTRAVENOUS
  Administered 2020-01-20: 0.5 ug/kg/h via INTRAVENOUS
  Administered 2020-01-20: 1 ug/kg/h via INTRAVENOUS
  Administered 2020-01-21 (×4): 0.6 ug/kg/h via INTRAVENOUS
  Administered 2020-01-21: 0.7 ug/kg/h via INTRAVENOUS
  Administered 2020-01-22: 0.8 ug/kg/h via INTRAVENOUS
  Administered 2020-01-22: 0.7 ug/kg/h via INTRAVENOUS
  Administered 2020-01-22: 0.8 ug/kg/h via INTRAVENOUS
  Administered 2020-01-22: 0.6 ug/kg/h via INTRAVENOUS
  Administered 2020-01-23: 1.2 ug/kg/h via INTRAVENOUS
  Administered 2020-01-23: 0.8 ug/kg/h via INTRAVENOUS
  Administered 2020-01-23: 1.2 ug/kg/h via INTRAVENOUS
  Administered 2020-01-23: 0.7 ug/kg/h via INTRAVENOUS
  Administered 2020-01-23: 0.8 ug/kg/h via INTRAVENOUS
  Administered 2020-01-23: 0.7 ug/kg/h via INTRAVENOUS
  Administered 2020-01-23: 0.9 ug/kg/h via INTRAVENOUS
  Administered 2020-01-24 (×5): 1.2 ug/kg/h via INTRAVENOUS
  Filled 2020-01-19 (×30): qty 50

## 2020-01-19 MED ORDER — PROPOFOL 1000 MG/100ML IV EMUL
0.0000 ug/kg/min | INTRAVENOUS | Status: DC
  Administered 2020-01-19: 15 ug/kg/min via INTRAVENOUS
  Filled 2020-01-19: qty 100

## 2020-01-19 MED ORDER — LACTATED RINGERS IV SOLN: INTRAVENOUS | Status: AC

## 2020-01-19 NOTE — Consult Note (Signed)
[place] Nurse ostomy consult note Stoma type/location: LLQ colostomy Stomal assessment/size: 1 3/4" edematous, pale pink, nonproductive  Stomal sweat in pouch Peristomal assessment: pouch intact Treatment options for stomal/peristomal skin: none today Output none Ostomy pouching: 2pc. 2 3/4" pouch set  Education provided: daughter at bedside.  She is emotional seeing the stoma.  Explained the appearance of the stoma is normal and we are waiting on stool production.  The MD has asked for possible irrigation.  I have ordered supplies and will have them at beside.   LAwson # [id number] irrigation kit [place] [phone] irrigation sleeve Kellie Simmering #2 2 3/4" barrier  Kellie Simmering # [phone] 2 3/4" pouch Enrolled patient in [place] program: No WOC team will follow.  Domenic Moras MSN, RN, FNP-BC [facility] Wound, Ostomy, Continence Nurse Pager [phone]

## 2020-01-19 NOTE — Progress Notes (Signed)
NAME:  Darlene Castillo, MRN:  [id number], DOB:  1961-07-26, LOS: 1 ADMISSION DATE:  01/18/2020, CONSULTATION DATE:  01/18/20 REFERRING MD:  Erroll Luna CHIEF COMPLAINT:  VDRF, septic shock  Brief History   Peritonitis 2/2 sigmoid colon perforation s/p ex-lap with resection and end colostomy  History of present illness   Darlene Castillo is a 59 year old woman who presented overnight with abdominal pain, constipation, one episode of vomiting and was found to have a perforated sigmoid colon and peritonitis.  She went to the OR for ex lap and return to the ICU intubated.  Past Medical History  Chronic opiate use Tobacco abuse DM HTN  Significant Hospital Events   5/5 ex lap with partial colectomy and end colostomy  Consults:  [facility] PCCM  Procedures:  5/5 ex lap with partial colectomy and end colostomy 5/5 CVC 5/5 A-line  Significant Diagnostic Tests:  CT abdomen pelvis with contrast 01/17/2020-scattered groundglass opacities in the lower lungs bilaterally, no focal opacities.  Abdominal free air.  Multiple dilated loops of bowel with stool.  Large R renal cyst.  Micro Data:  Covid negative  Antimicrobials:  Zosyn 5/5 >>  Interim history/subjective:  Received fluid boluses and titrated off propofol for hypotension overnight. UOP improved with boluses. She is awake but reports uncontrolled abdominal pain this morning. She denies pain otherwise.   Objective   Blood pressure (!) 84/48, pulse (!) 124, temperature 98.7 F (37.1 C), temperature source Axillary, resp. rate (!) 26, weight 67 kg, last menstrual period 05/23/2013, SpO2 96 %.    Vent Mode: PRVC FiO2 (%):  [60 %-100 %] 60 % Set Rate:  [16 bmp-26 bmp] 26 bmp Vt Set:  [370 mL-450 mL] 370 mL PEEP:  [5 cmH20-12 cmH20] 10 cmH20 Plateau Pressure:  [25 cmH20-29 cmH20] 26 cmH20   Intake/Output Summary (Last 24 hours) at 01/19/2020 8546 Last data filed at 01/19/2020 0700 Gross per 24 hour  Intake 8000.9 ml  Output 1586 ml  Net 6414.9  ml   Filed Weights   01/18/20 1300  Weight: 67 kg    Examination: General: ill appearing woman laying in bed in NAD HENT: [place]/AT, eyes anicteric, ETT & NGT Lungs: breathing comfortably on the vent, not breathing over. CTAB. Reduced basilar breath sounds Cardiovascular: tachycardic, regular rhythm, no murmurs Abdomen: distended, TTP. Wound examined with surgery- open [place] tissue- no bleeding or drainage. Tissue pink. Ostomy pink, minimal blood tinged serous drainage in bag. Neuro: RASS +1. Moving all extremities spontaneously, nodding to answer questions GU: Foley in place draining yellow urine    Resolved Hospital Problem list     Assessment & Plan:    Acute hypoxic respiratory failure requiring mechanical ventilation; going into ARDS. Current P:F 163 on PEEP 12, 60%. -Con't LTVV, 4-8 cc/kg ideal body weight with goal plateau less than 30 & driving pressure less than 15.  Titrate PEEP and FiO2 per ARDS protocol.  Permissive hypercapnia okay as long as she is tolerating from a pH standpoint. -Sedation as required to tolerate mechanical ventilation.  Will need assistance with pain control given her history of chronic opiate use.  Fentanyl, methadone IV. Adding precedex today. May need ketamine if she remains refractory. -VAP prevention protocol -Daily SAT and SBT when appropriate  Septic shock due to peritonitis due to sigmoid colon perforation -Continue Zosyn; empiric antifungals is felt unnecessary in those with community-acquired infections with perforated viscus -Continue vasopressors as required to maintain MAP greater than 65 -Appreciate surgery's assistance; consulting wound care today. Possible  wound vac in the future. PICC today for TPN. -Holding enteral feeds until cleared by surgery -At risk for progressive multiorgan failure; strict I/O  Chronic methadone use -Discussed with pharmacy; will increase methadone to 7.5mg  Q6h -fentanyl PRN + gtt for pain control -adding  precedex for synergy -unfortunately unable to add NSAIDs given high risk of AKI already  Hypokalemia- improved -Recheck BMP this morning  Hypophosphatemia -repleted overnight -recheck this morning  Hypomagnesemia -repleted overnight -recheck this AM  Leukopenia due to sepsis -con't to monitor  Lactic acidosis due to septic shock -Continue to trend -Maintain MAP greater than 65 -Continue broad-spectrum antibiotics -Ongoing volume resuscitation; increasing LR. May require additional boluses.  Prediabetes; A1c 6.3. Hyperglycemia controlled on SSI.  -Accu-Cheks every 4 hours with sliding scale insulin PRN. -Goal BG 140-180 while admitted to the ICU.  Will add insulin infusion if not meeting goals. -likely will require additional coverage once TPN starts  History of tobacco abuse -Will counsel on the importance of quitting when appropriate -Will monitor for signs of obstruction on MV and need for bronchodilators.   Best practice:  Diet: NPO Pain/Anxiety/Delirium protocol (if indicated): yes VAP protocol (if indicated): yes DVT prophylaxis: SCDs GI prophylaxis: pepcid Glucose control: SSI Mobility: bedrest Code Status: full Family Communication: will update family at bedside today Disposition: ICU  Labs   CBC: Recent Labs  Lab 01/18/20 0525 01/18/20 0525 01/18/20 1101 01/18/20 1141 01/18/20 1340 01/18/20 2040 01/19/20 0158  WBC 8.8  --   --   --  1.3* 2.3* 2.5*  HGB 12.6   < > 13.9 9.9* 12.7 13.7 12.5  HCT 41.1   < > 41.0 29.0* 40.4 42.3 38.1  MCV 88.6  --   --   --  90.4 86.0 84.5  PLT 280  --   --   --  210 243 217   < > = values in this interval not displayed.    Basic Metabolic Panel: Recent Labs  Lab 01/18/20 0525 01/18/20 0525 01/18/20 1101 01/18/20 1141 01/18/20 1340 01/18/20 2040 01/19/20 0158 01/19/20 0159  NA 141   < > 142 144 142 142 142  --   K 2.5*   < > 2.9* 2.6* 3.0* 4.0 3.6  --   CL 101  --   --   --  112* 118* 115*  --   CO2 26   --   --   --  20* 16* 18*  --   GLUCOSE 257*  --   --   --  141* 195* 181*  --   BUN 17  --   --   --  13 16 18   --   CREATININE 0.89  --   --   --  0.78 0.83 0.96  --   CALCIUM 9.0  --   --   --  7.1* 7.3* 7.3*  --   MG 2.2  --   --   --   --   --   --  1.5*  PHOS  --   --   --   --   --   --  1.6*  --    < > = values in this interval not displayed.   GFR: Estimated Creatinine Clearance: 55.3 mL/min (by C-G formula based on SCr of 0.96 mg/dL). Recent Labs  Lab 01/18/20 0525 01/18/20 1225 01/18/20 1340 01/18/20 1630 01/18/20 2040 01/19/20 0158  WBC 8.8  --  1.3*  --  2.3* 2.5*  LATICACIDVEN  --  7.3*  --  6.6*  --   --     Liver Function Tests: Recent Labs  Lab 01/18/20 0525 01/18/20 1340 01/19/20 0158  AST 26 28 28   ALT 14 12 13   ALKPHOS 66 26* 24*  BILITOT 0.4 0.8 0.8  PROT 7.9 4.1* 3.9*  ALBUMIN 4.5 2.8* 2.2*   Recent Labs  Lab 01/18/20 0525  LIPASE 19   No results for input(s): AMMONIA in the last 168 hours.  ABG    Component Value Date/Time   PHART 7.390 01/19/2020 0425   PCO2ART 29.4 (L) 01/19/2020 0425   PO2ART 98.3 01/19/2020 0425   HCO3 17.4 (L) 01/19/2020 0425   TCO2 19 (L) 01/18/2020 1141   ACIDBASEDEF 6.0 (H) 01/19/2020 0425   O2SAT 97.9 01/19/2020 0425     Coagulation Profile: No results for input(s): INR, PROTIME in the last 168 hours.  Cardiac Enzymes: No results for input(s): CKTOTAL, CKMB, CKMBINDEX, TROPONINI in the last 168 hours.  HbA1C: Hgb A1c MFr Bld  Date/Time Value Ref Range Status  01/18/2020 05:25 AM 6.3 (H) 4.8 - 5.6 % Final    Comment:    (NOTE) Pre diabetes:          5.7%-6.4% Diabetes:              >6.4% Glycemic control for   <7.0% adults with diabetes     CBG: Recent Labs  Lab 01/18/20 1110 01/18/20 1149 01/18/20 1631 01/18/20 2321 01/19/20 0354  GLUCAP 205* 153* 176* 139* 155*   This patient is critically ill with multiple organ system failure which requires frequent high complexity decision making,  assessment, support, evaluation, and titration of therapies. This was completed through the application of advanced monitoring technologies and extensive interpretation of multiple databases. During this encounter critical care time was devoted to patient care services described in this note for 45 minutes.   03/19/20, DO 01/19/20 7:50 AM [facility]

## 2020-01-19 NOTE — Progress Notes (Signed)
PHARMACY - TOTAL PARENTERAL NUTRITION CONSULT NOTE   Indication: Prolonged ileus  Patient Measurements: Weight: 67 kg (147 lb 11.3 oz)   Body mass index is 27.91 kg/m. Usual Weight: 67 kg  Assessment: 59 yo F s/p Sigmoid colon perforation secondary to chronic constipation and ulceration with feculent peritonitis and gross contamination of abdominal cavity with stool balls  -S/p Exploratory laparotomy with partial colectomy and end colostomy 5/5 Dr. Luisa Hart 5/6 to place PICC and start TPN for nutrition support; expect prolonged ileus  Glucose / Insulin: prediabetes, A1c 6.3, got dex 10 mg 5/5@ 1150 am, CBGs 134-176, serum glucose 141-257 Electrolytes: phos 1.6 @ 2am, phos 4.3 @ 835 am during Kphos infusion of 86mM so not accurate value. Mag 1.6 @ 2am and 2.1 @835  am after 2 gm mag bolus; K 4.1 after 10 runs K and partial dose of 30 Kphos; CoCa 8.44 Renal: Scr WNL, good UOP LFTs / TGs: LFTs WNL, Trig 47 & 26, on propofol Prealbumin / albumin: prealbumin 9.7 5/6 reflects inflammation of post-op stress and critical illness, admit alb 4.5 WNL, now 2.2 post-op, BMI 27.9, well-nourished PTA, poor PO intake for 5 days PTA Intake / Output; MIVF: LR at 75 ml/hr, levophed at 3 mcg/min; fent at 400 mcg/hr, propofol at 10 mcg/kg/min; precedex at 0.4 mcg/kg/hr GI Imaging: Surgeries / Procedures:   -S/p Exploratory laparotomy with partial colectomy and end colostomy 5/5 Dr. access: PICC ordered 5/6 for TPN TPN start date: 01/19/2020  Nutritional Goals (per RD recommendation on 01/19/2020): kCal: 1584, Protein: 114-130, Fluid: >= 1.5 L/day  Goal TPN rate is 75 mL/hr (provides 117 g of protein and ~1570 kcals per day) without lipids Current Nutrition:  NPO Propofol at 4 ml/hr providing 105 kcal/24 hrs  Plan:  Start TPN at 30 mL/hr at 1800 Hold lipid emulsion for first 7 days for critically ill patients per ASPEN guidelines (Start date 01/25/20) Electrolytes in TPN: 43mEq/L of Na,  77mEq/L of K, 102mEq/L of Ca, 36mEq/L of Mg, and 86mmol/L of Phos. Cl:Ac ratio 1:1 Add standard MVI and trace elements to TPN Continue Sensitive q4h SSI and adjust as needed  Reduce MIVF to 45 mL/hr at 1800 Once rate >= 42 ml/hr can add pepcid to TPN and stop pepcid boluses Monitor TPN labs on Mon/Thurs, full TPN labs in AM  12m, Pharm.D 01/19/2020 11:19 AM

## 2020-01-19 NOTE — Progress Notes (Signed)
Results for Robarge, SHAREEN CAPWELL (MRN [id number]) as of 01/19/2020 01:34  Ref. Range 01/19/2020 01:15  FIO2 Unknown 60.00  pH, Arterial Latest Ref Range: 7.350 - 7.450  7.364  pCO2 arterial Latest Ref Range: 32.0 - 48.0 mmHg 30.6 (L)  pO2, Arterial Latest Ref Range: 83.0 - 108.0 mmHg 80.8 (L)  Acid-base deficit Latest Ref Range: 0.0 - 2.0 mmol/L 6.7 (H)  Bicarbonate Latest Ref Range: 20.0 - 28.0 mmol/L 16.9 (L)  O2 Saturation Latest Units: % 95.4  Patient temperature Unknown 100.0  A-Line Drawl/VENT Settings:PRVC-.370/R-26/+10 PEEP/60% FI02

## 2020-01-19 NOTE — Progress Notes (Signed)
eLink Physician-Brief Progress Note Patient Name: Darlene Castillo DOB: 1961-04-02 MRN: [id number]   Date of Service  01/19/2020  HPI/Events of Note  Agitation - Sedation not adequate with Fentanyl IV infusion at ceiling dose of 200 mcg/hour.   eICU Interventions  Will increase ceiling dose on Fentanyl IV infusion to 400 mcg/hour.      Intervention Category Major Interventions: Delirium, psychosis, severe agitation - evaluation and management  Khalani Novoa Eugene 01/19/2020, 5:48 AM

## 2020-01-19 NOTE — Progress Notes (Signed)
Notified elink re patient tachycardia and oliguria. Fluid bolus of 500 ordered and abg.

## 2020-01-19 NOTE — Progress Notes (Addendum)
Pharmacy Note  59 y/o F with bowel perforation from chronic constipation s/p ex lap with partial colectomy and end colostomy. Patient has a history of methadone use for opioid use disorder at a dose of 27 mg daily.  Patient returned to ICU from OR intubated with propofol and PRN fentanyl for sedation along with prn hydromorphone to maintain RASS goal. Will continue methadone in an attempt to avoid excessive use of opioids for sedation and pain control. Will start with methadone 5 mg iv q 8 hours (15 mg daily ~ half of daily oral maintenance dose) and increase dose as needed to achieve stated goal of limited opioid use. Discussed plan with surgery and critical care team. Would recommend close monitoring of QTc upon initiation and with dosage changes.   01/19/20 2:28 PM  - transitioned to Precedex now at 0.5 mcg/kg/hr - fentanyl as high as 350 mcg/hr - hypotension requiring NE up to 20 mcg/min and currently weaning - QTc 390 on tele  Increased methadone dose to 7.5 mg iv q 6 hours today. Goal is to be somewhat conservative with dosage adjustments to prevent having excessive accumulation but aggressive enough to achieve pain and sedation goals and limited other opioid use. CMET in AM to assess liver function for methadone dosing. Will f/u in AM.   Luisa Hart, PharmD, BCPS

## 2020-01-19 NOTE — Progress Notes (Signed)
Initial Nutrition Assessment  DOCUMENTATION CODES:   Not applicable  INTERVENTION:  TPN per pharmacist Will monitor for initiation of tube feedings per surgery recommendations  Monitor magnesium, potassium, and phosphorus daily for at least 3 days, MD to replete as needed, as pt is at risk for refeeding syndrome given poor po intake 5 days PTA per patient report  NUTRITION DIAGNOSIS:   Inadequate oral intake related to inability to eat as evidenced by NPO status.  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 CT revealed colonic perforation in sigmoid/rectosigmoid.  Patient admitted on 5/5 for sigmoid colon perforation secondary to chronic constipation and ulceration with feculent peritonitis and gross contamination of abdominal cavity with stool balls.  5/5 - ex-lap with partial colectomy and end colostomy  Patient alert on vent, having a lot of abdominal pain this morning.  Per notes: -continue vasopressors to maintain MAP > 65 -PICC today for TPN -Holding enteral feedings until cleared by surgery -hypokalemia improved  Current wt 147.4 lbs Weight history reviewed, on 10/17/19 pt weighed 136.62 lbs, on 09/27/19 pt weighed 137.72 lbs.   BP: 88/57 (a-line) MAP: 65 (a-line)  I/O: +7414 ml since admit UOP: 1536 ml x 24 hrs  Patient is currently intubated on ventilator support MV: 9.5 L/min Temp (24hrs), Avg:98.6 F (37 C), Min:97.6 F (36.4 C), Max:100.6 F (38.1 C)  Propofol: 4.02 ml/hr providing 106  kcal Medications reviewed and include: SSI, Methadone Drips: Pepcid Lactated ringers Zosyn Potassium phosphate 30 mmol in D5 Precedex 0.4 mcg Fentanyl 50 mcg Levo 7 mcg Vasopressin off Labs: CBGs 194,174,081 Lab Results  Component Value Date   HGBA1C 6.3 (H) 01/18/2020   NUTRITION - FOCUSED PHYSICAL EXAM: Deferred   Diet Order:   Diet Order            Diet NPO time specified  Diet effective now              EDUCATION NEEDS:   Not appropriate for education at this time  Skin:  Skin Assessment: Skin Integrity Issues: Skin Integrity Issues:: Incisions Incisions: closed; abdomen  Last BM:  PTA  Height:   Ht Readings from Last 1 Encounters:  10/17/19 5\' 1"  (1.549 m)    Weight:   Wt Readings from Last 1 Encounters:  01/18/20 67 kg    BMI:  Body mass index is 27.91 kg/m.  Estimated Nutritional Needs:   Kcal:  1584  Protein:  114-130  Fluid:  >/= 1.5 L/day    03/19/20, RD, LDN Clinical Nutrition After Hours/Weekend Pager # in Amion

## 2020-01-19 NOTE — Progress Notes (Signed)
Peripherally Inserted Central Catheter Placement  The IV Nurse has discussed with the patient and/or persons authorized to consent for the patient, the purpose of this procedure and the potential benefits and risks involved with this procedure.  The benefits include less needle sticks, lab draws from the catheter, and the patient may be discharged home with the catheter. Risks include, but not limited to, infection, bleeding, blood clot (thrombus formation), and puncture of an artery; nerve damage and irregular heartbeat and possibility to perform a PICC exchange if needed/ordered by physician.  Alternatives to this procedure were also discussed.  Bard Power PICC patient education guide, fact sheet on infection prevention and patient information card has been provided to patient /or left at bedside  Consent obtained via telephone with husband [facility].    PICC Placement Documentation  PICC Triple Lumen 01/19/20 PICC Left Brachial 42 cm 2 cm (Active)  Indication for Insertion or Continuance of Line Administration of hyperosmolar/irritating solutions (i.e. TPN, Vancomycin, etc.) 01/19/20 1200  Exposed Catheter (cm) 2 cm 01/19/20 1200  Site Assessment Clean;Dry;Intact 01/19/20 1200  Lumen #1 Status Flushed;Saline locked;Blood return noted 01/19/20 1200  Lumen #2 Status Flushed;Saline locked;Blood return noted 01/19/20 1200  Lumen #3 Status Flushed;Saline locked;Blood return noted 01/19/20 1200  Dressing Type Transparent;Securing device 01/19/20 1200  Dressing Status Clean;Dry;Intact;Antimicrobial disc in place 01/19/20 1200  Dressing Change Due 01/26/20 01/19/20 1200       Franne Grip Renee 01/19/2020, 12:03 PM

## 2020-01-19 NOTE — Progress Notes (Signed)
Notified elink re patient increased temp, tachycardia and abg results.  Awaiting new orders

## 2020-01-19 NOTE — TOC Initial Note (Signed)
Transition of Care [facility]) - Initial/Assessment Note    Patient Details  Name: SEMIAH KONCZAL MRN: [id number] Date of Birth: 1961-08-17  Transition of [facility] CM/SW Contact:    Golda Acre, RN Phone Number: 01/19/2020, 10:48 AM  Clinical Narrative:                 From home has pcp should eventually return home/s/p partial colectomy and colostomy, ventilator, a.lines, iv pressors and iv sedation.        Patient Goals and CMS Choice        Expected Discharge Plan and Services                                                Prior Living Arrangements/Services                       Activities of Daily Living Home Assistive Devices/Equipment: Blood pressure cuff ADL Screening (condition at time of admission) Patient's cognitive ability adequate to safely complete daily activities?: Yes Is the patient deaf or have difficulty hearing?: No Does the patient have difficulty seeing, even when wearing glasses/contacts?: No Does the patient have difficulty concentrating, remembering, or making decisions?: No Patient able to express need for assistance with ADLs?: Yes Does the patient have difficulty dressing or bathing?: No Independently performs ADLs?: Yes (appropriate for developmental age) Does the patient have difficulty walking or climbing stairs?: No Weakness of Legs: None Weakness of Arms/Hands: None  Permission Sought/Granted                  Emotional Assessment              Admission diagnosis:  Pneumoperitoneum [K66.8] Lower abdominal pain [R10.30] Perforation of sigmoid colon (HCC) [K63.1] Patient Active Problem List   Diagnosis Date Noted  . Pneumoperitoneum 01/18/2020   PCP:  Verlon Au, MD Pharmacy:   [facility] [phone] - [place], [place] - [address] AT [facility] [address] [place] [address] Phone: [phone] Fax: [phone]     Social  Determinants of Health (SDOH) Interventions    Readmission Risk Interventions No flowsheet data found.

## 2020-01-19 NOTE — Progress Notes (Signed)
eLink Physician-Brief Progress Note Patient Name: Darlene Castillo DOB: 03/18/1961 MRN: [id number]   Date of Service  01/19/2020  HPI/Events of Note  Oliguria - BP = 64/48 with MAP = 34. CVP = 12 and Hgb = 13.7. Lactic Acid = 6.6   eICU Interventions  Will order: 1. Bolus with 0.9 NaCl 500 mL IV over 30 minutes now. 2. Please titrate the Norepinephrine IV infusion to MAP >= 65.      Intervention Category Major Interventions: Other:  [name] 01/19/2020, 12:59 AM

## 2020-01-19 NOTE — Progress Notes (Signed)
eLink Physician-Brief Progress Note Patient Name: Darlene Castillo DOB: December 03, 1960 MRN: [id number]   Date of Service  01/19/2020  HPI/Events of Note  Hypotension with increased vasopressor requirements and sinus tachycardia with ventricular rate = 125. K+ = 3.6, PO4--- = 1.6 , Mg++ = 1.5 and Creatinine = 0.96.  eICU Interventions  Will order: 1. Bolus with 0.9 NaCl 1 liter IV over 1 hour now. 2. Titrate Norepinephrine IV infusion to support hemodynamics. 3. Start Vasopressin as ordered.  4. Titrate Fentanyl IV infusion up for sedation.  5. Wean Propofol IV infusion as tolerated.  6. Replace K+, PO4--- and Mg++.     Intervention Category Major Interventions: Hypotension - evaluation and management  Hollye Pritt Eugene 01/19/2020, 3:52 AM

## 2020-01-19 NOTE — Progress Notes (Addendum)
[facility] Progress Note  1 Day Post-Op  Subjective: CC-  Alert on the vent, having a lot of abdominal pain. On pressor support. Good UOP over night.  Objective: Vital signs in last 24 hours: Temp:  [97.6 F (36.4 C)-100.6 F (38.1 C)] 98.7 F (37.1 C) (05/06 0400) Pulse Rate:  [72-132] 124 (05/06 0700) Resp:  [19-27] 26 (05/06 0700) BP: (61-123)/(34-96) 84/48 (05/06 0200) SpO2:  [87 %-100 %] 96 % (05/06 0729) Arterial Line BP: (79-145)/(48-75) 115/56 (05/06 0700) FiO2 (%):  [60 %-100 %] 60 % (05/06 0729) Weight:  [67 kg] 67 kg (05/05 1300) Last BM Date: (UTA- pt intubated and sedated)  Intake/Output from previous day: 05/05 0701 - 05/06 0700 In: 9000.9 [I.V.:4848.9; IV Piggyback:4152] Out: 7829 [Urine:1536; Blood:50] Intake/Output this shift: No intake/output data recorded.  PE: Gen:  Alert, NAD HEENT: ETT and NGT in place Card:  Tachycardic, no M/G/R heard, 2+ DP pulses Pulm:  CTAB, no W/R/R, mechanically ventilated GU: foley with yellow urine Abd: distended, hypoactive BS, open midline incision pink without erythema or drainage, ostomy viable with no stool or air in bag     Lab Results:  Recent Labs    01/18/20 2040 01/19/20 0158  WBC 2.3* 2.5*  HGB 13.7 12.5  HCT 42.3 38.1  PLT 243 217   BMET Recent Labs    01/18/20 2040 01/19/20 0158  NA 142 142  K 4.0 3.6  CL 118* 115*  CO2 16* 18*  GLUCOSE 195* 181*  BUN 16 18  CREATININE 0.83 0.96  CALCIUM 7.3* 7.3*   PT/INR No results for input(s): LABPROT, INR in the last 72 hours. CMP     Component Value Date/Time   NA 142 01/19/2020 0158   K 3.6 01/19/2020 0158   CL 115 (H) 01/19/2020 0158   CO2 18 (L) 01/19/2020 0158   GLUCOSE 181 (H) 01/19/2020 0158   BUN 18 01/19/2020 0158   CREATININE 0.96 01/19/2020 0158   CALCIUM 7.3 (L) 01/19/2020 0158   PROT 3.9 (L) 01/19/2020 0158   ALBUMIN 2.2 (L) 01/19/2020 0158   AST 28 01/19/2020 0158   ALT 13 01/19/2020 0158   ALKPHOS 24 (L)  01/19/2020 0158   BILITOT 0.8 01/19/2020 0158   GFRNONAA >60 01/19/2020 0158   GFRAA >60 01/19/2020 0158   Lipase     Component Value Date/Time   LIPASE 19 01/18/2020 0525       Studies/Results: CT Abdomen Pelvis W Contrast  Result Date: 01/18/2020 CLINICAL DATA:  Abdominal pain, constipation, emesis after eating crab legs, last BM 5 days prior EXAM: CT ABDOMEN AND PELVIS WITH CONTRAST TECHNIQUE: Multidetector CT imaging of the abdomen and pelvis was performed using the standard protocol following bolus administration of intravenous contrast. CONTRAST:  157mL OMNIPAQUE IOHEXOL 300 MG/ML  SOLN COMPARISON:  CT abdomen and pelvis 07/10/1999 (report only) FINDINGS: Lower chest: There are diffuse atelectatic changes in the lung bases with some superimposed areas of or ground-glass opacity and possible consolidation. Cardiac size at the upper limits of normal. No pericardial effusion. Focal nodular thickening in the soft tissues of the right breast measuring up to 1.9 cm (2/9). Hepatobiliary: No focal liver lesion. Smooth liver surface contour. Normal liver attenuation. Mild gallbladder wall thickening. No visible calcified gallstones or biliary ductal dilatation. Small amount of fluid and gas surrounding the liver and extending into the gallbladder fossa, favor redistributed rather than primary. Pancreas: Mild pancreatic atrophy. No ductal dilatation or peripancreatic inflammation. Spleen: Normal in size without focal  abnormality. Small amount of perisplenic fluid, likely redistributed. Adrenals/Urinary Tract: Normal adrenal glands. 6 cm fluid attenuation cyst arising from the upper pole right kidney. No concerning features. No worrisome renal lesions or masses. Kidneys enhance and excrete symmetrically. No urolithiasis or hydronephrosis. Urinary bladder is largely decompressed at the time of exam and therefore poorly evaluated by CT imaging. Mild bladder thickening may be reactive. Stomach/Bowel: Small  hiatal hernia. Stomach and duodenal sweep are unremarkable. There is diffuse mild edematous thickening of the small bowel with faint adjacent hazy stranding of the mesentery. A normal appendix is visualized. More pronounced pancolonic edematous mural thickening is present with a large colonic stool burden throughout the colon including more inspissated material towards the distal colon. There appears to be frank discontinuity along the left lateral wall of the distal sigmoid with spillage of some of this inspissated feculent material into the low pelvis and rectovaginal pouch with surrounding free fluid and large volume of extraluminal gas with more intense mural thickening towards the rectosigmoid. Vascular/Lymphatic: Extensive atherosclerotic plaque of the abdominal aorta and iliac arteries without aneurysm or ectasia. Minimal plaque within the branch vessels. Reactive adenopathy in the mesentery. No pathologically enlarged nodes. Reproductive: Anteverted uterus. No concerning adnexal lesions. Feculent material in the rectovaginal pouch and left adnexa. Other: Suspected spillage of inspissated feculent material and fluid into the abdomen and pelvis with more redistributed inflammatory fluid and free air in the upper abdomen. Musculoskeletal: No acute osseous abnormality or suspicious osseous lesion. IMPRESSION: 1. Suspected frank discontinuity along the left lateral wall of the distal sigmoid with spillage of some of this inspissated feculent material and fluid into the low pelvis and rectovaginal pouch with surrounding free fluid and large volume of extraluminal gas with more intense mural thickening towards the rectosigmoid. 2. Diffuse large and small bowel thickening and perienteric stranding, more pronounced through the colon and most focally towards the rectosigmoid. Unclear how much may reflect a primary inflammatory process such as an enterocolitis versus reactive change. 3. Mild gallbladder wall  thickening. Likely redistributed fluid and air in the gallbladder fossa. Favor reactive change given the presence of more significant findings in the abdomen and pelvis though should correlate with clinical exam findings. 4. Focal nodular thickening in the soft tissues of the right breast measuring up to 1.9 cm. Recommend correlation with mammography. 5. Mild bladder wall thickening, favor reactive. 6. Small hiatal hernia. 7. Questionable airspace disease upon atelectatic changes in lung bases. Could reflect infection including atypical viral etiologies. 8. Aortic Atherosclerosis (ICD10-I70.0). Electronically Signed   By: Kreg Shropshire M.D.   On: 01/18/2020 07:00   DG CHEST PORT 1 VIEW  Result Date: 01/18/2020 CLINICAL DATA:  Hypoxia.  Postoperative sigmoid perforation EXAM: PORTABLE CHEST 1 VIEW COMPARISON:  None. FINDINGS: Endotracheal tube tip is 8 mm above the carina. Nasogastric tube tip and side port in stomach. Central catheter tip is at the cavoatrial junction. No pneumothorax. There is bibasilar atelectasis. Heart size and pulmonary vascularity are normal. No adenopathy. No bone lesions. IMPRESSION: Tube and catheter positions as described without pneumothorax. Note that the endotracheal tube tip is near the carina; advise withdrawing endotracheal tube approximately 2.5 cm. Bibasilar atelectasis. Developing pneumonia or possibly aspiration in the bases cannot be excluded. Heart size normal. Electronically Signed   By: Bretta Bang III M.D.   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 Luisa Hart - POD#1 - surgical path pending - BID wet to dry dressing changes to midline abdominal wound, may transition to vac in the future - WOC RN consult for new colostomy - expect prolonged ileus so we will go ahead and start TPN for nutrition, continue NPO/NGT to LIWS - continue IV zosyn, will discuss with MD if we need to start antifungal - appreciate CCM assistance  ID - zosyn 5/5>>day#2 FEN - IVF, NPO/NGT to LIWS, start TPN VTE - SCDs, lovenox Foley - continue for strict I&O Follow up - TBD   LOS: 1 day    Franne Forts, [facility] 01/19/2020, 7:51 AM Please see Amion for pager number during day hours 7:00am-4:30pm

## 2020-01-20 DIAGNOSIS — D649 Anemia, unspecified: Secondary | ICD-10-CM

## 2020-01-20 LAB — CBC
HCT: 29.7 % — ABNORMAL LOW (ref 36.0–46.0)
Hemoglobin: 9.9 g/dL — ABNORMAL LOW (ref 12.0–15.0)
MCH: 28 pg (ref 26.0–34.0)
MCHC: 33.3 g/dL (ref 30.0–36.0)
MCV: 84.1 fL (ref 80.0–100.0)
Platelets: 142 10*3/uL — ABNORMAL LOW (ref 150–400)
RBC: 3.53 MIL/uL — ABNORMAL LOW (ref 3.87–5.11)
RDW: 13.6 % (ref 11.5–15.5)
WBC: 7.6 10*3/uL (ref 4.0–10.5)
nRBC: 0 % (ref 0.0–0.2)

## 2020-01-20 LAB — TRIGLYCERIDES: Triglycerides: 331 mg/dL — ABNORMAL HIGH (ref <150)

## 2020-01-20 LAB — BLOOD GAS, ARTERIAL
Acid-base deficit: 3.6 mmol/L — ABNORMAL HIGH (ref 0.0–2.0)
Bicarbonate: 19.6 mmol/L — ABNORMAL LOW (ref 20.0–28.0)
FIO2: 50
MECHVT: 370 mL
O2 Saturation: 98.1 %
PEEP: 8 cmH2O
Patient temperature: 98.6
RATE: 26 resp/min
pCO2 arterial: 30 mmHg — ABNORMAL LOW (ref 32.0–48.0)
pH, Arterial: 7.431 (ref 7.350–7.450)
pO2, Arterial: 109 mmHg — ABNORMAL HIGH (ref 83.0–108.0)

## 2020-01-20 LAB — COMPREHENSIVE METABOLIC PANEL
ALT: 13 U/L (ref 0–44)
AST: 33 U/L (ref 15–41)
Albumin: 2.2 g/dL — ABNORMAL LOW (ref 3.5–5.0)
Alkaline Phosphatase: 31 U/L — ABNORMAL LOW (ref 38–126)
Anion gap: 7 (ref 5–15)
BUN: 15 mg/dL (ref 6–20)
CO2: 22 mmol/L (ref 22–32)
Calcium: 7.4 mg/dL — ABNORMAL LOW (ref 8.9–10.3)
Chloride: 113 mmol/L — ABNORMAL HIGH (ref 98–111)
Creatinine, Ser: 0.87 mg/dL (ref 0.44–1.00)
GFR calc Af Amer: >60 mL/min (ref >60)
GFR calc non Af Amer: >60 mL/min (ref >60)
Glucose, Bld: 206 mg/dL — ABNORMAL HIGH (ref 70–99)
Potassium: 4.2 mmol/L (ref 3.5–5.1)
Sodium: 142 mmol/L (ref 135–145)
Total Bilirubin: 0.6 mg/dL (ref 0.3–1.2)
Total Protein: 4.5 g/dL — ABNORMAL LOW (ref 6.5–8.1)

## 2020-01-20 LAB — PHOSPHORUS: Phosphorus: 2.4 mg/dL — ABNORMAL LOW (ref 2.5–4.6)

## 2020-01-20 LAB — DIFFERENTIAL
Abs Immature Granulocytes: 0.04 10*3/uL (ref 0.00–0.07)
Basophils Absolute: 0.1 10*3/uL (ref 0.0–0.1)
Basophils Relative: 1 %
Eosinophils Absolute: 0 10*3/uL (ref 0.0–0.5)
Eosinophils Relative: 0 %
Immature Granulocytes: 1 %
Lymphocytes Relative: 5 %
Lymphs Abs: 0.4 10*3/uL — ABNORMAL LOW (ref 0.7–4.0)
Monocytes Absolute: 0.1 10*3/uL (ref 0.1–1.0)
Monocytes Relative: 2 %
Neutro Abs: 7 10*3/uL (ref 1.7–7.7)
Neutrophils Relative %: 91 %

## 2020-01-20 LAB — GLUCOSE, CAPILLARY
Glucose-Capillary: 111 mg/dL — ABNORMAL HIGH (ref 70–99)
Glucose-Capillary: 128 mg/dL — ABNORMAL HIGH (ref 70–99)
Glucose-Capillary: 129 mg/dL — ABNORMAL HIGH (ref 70–99)
Glucose-Capillary: 151 mg/dL — ABNORMAL HIGH (ref 70–99)
Glucose-Capillary: 177 mg/dL — ABNORMAL HIGH (ref 70–99)
Glucose-Capillary: 180 mg/dL — ABNORMAL HIGH (ref 70–99)
Glucose-Capillary: 89 mg/dL (ref 70–99)

## 2020-01-20 LAB — MAGNESIUM: Magnesium: 2.2 mg/dL (ref 1.7–2.4)

## 2020-01-20 MED ORDER — LACTATED RINGERS IV SOLN: INTRAVENOUS | Status: AC

## 2020-01-20 MED ORDER — TRAVASOL 10 % IV SOLN
INTRAVENOUS | Status: AC
  Filled 2020-01-20: qty 702

## 2020-01-20 MED ORDER — INSULIN ASPART 100 UNIT/ML ~~LOC~~ SOLN
2.0000 [IU] | SUBCUTANEOUS | Status: DC
  Administered 2020-01-20: 2 [IU] via SUBCUTANEOUS
  Administered 2020-01-20: 4 [IU] via SUBCUTANEOUS
  Administered 2020-01-20 – 2020-01-22 (×4): 2 [IU] via SUBCUTANEOUS
  Administered 2020-01-22: 4 [IU] via SUBCUTANEOUS
  Administered 2020-01-22 (×3): 2 [IU] via SUBCUTANEOUS
  Administered 2020-01-23 (×4): 4 [IU] via SUBCUTANEOUS

## 2020-01-20 MED ORDER — SODIUM PHOSPHATES 45 MMOLE/15ML IV SOLN
10.0000 mmol | Freq: Once | INTRAVENOUS | Status: AC
  Administered 2020-01-20: 10 mmol via INTRAVENOUS
  Filled 2020-01-20: qty 3.33

## 2020-01-20 NOTE — Progress Notes (Signed)
PHARMACY - TOTAL PARENTERAL NUTRITION CONSULT NOTE   Indication: Prolonged ileus  Patient Measurements: Height: 5\' 1"  (154.9 cm) Weight: 67 kg (147 lb 11.3 oz) IBW/kg (Calculated) : 47.8   Body mass index is 27.91 kg/m. Usual Weight: 67 kg  Assessment: 59 yo F s/p Sigmoid colon perforation secondary to chronic constipation and ulceration with feculent peritonitis and gross contamination of abdominal cavity with stool balls  -S/p Exploratory laparotomy with partial colectomy and end colostomy 5/5 Dr. 46 5/6 PICC placed and started TPN for nutrition support; expect prolonged ileus  Glucose / Insulin: prediabetes, A1c 6.3, got dex 10 mg 5/5@ 1150 am, CBGs 134-180, serum glucose 206 (10 units SSI/24hr) Electrolytes: phos 2.4 s/p Kphos 32mM yesterday, Mag 2.2 s/p 2 gm mag bolus; K 4.2 after multiple K runs since admit; CoCa 8.84. Cl elevated 113. Renal: Scr WNL, 850cc UOP over past 12hr LFTs / TGs: LFTs WNL, Trig increased from normal to 331: propofol dc'd this AM and no lipids in TPN Prealbumin / albumin: prealbumin 9.7 5/6 reflects inflammation of post-op stress and critical illness, admit alb 4.5 WNL, now 2.2 post-op, BMI 27.9, well-nourished PTA, poor PO intake for 5 days PTA Intake / Output; MIVF: LR at 45 ml/hr, levophed at 6 mcg/min; fent at 250 mcg/hr; precedex at 1 mcg/kg/hr GI Imaging: Surgeries / Procedures:   -S/p Exploratory laparotomy with partial colectomy and end colostomy 5/5 Dr. 31m access: PICC 5/6 for TPN TPN start date: 01/19/2020  Nutritional Goals (per RD recommendation on 01/19/2020): kCal: 1584, Protein: 114-130, Fluid: >= 1.5 L/day  Goal TPN rate is 75 mL/hr (provides 117 g of protein and ~1570 kcals per day) without lipids Current Nutrition:  TPN at 69ml/hr  Plan:  Sodium phosphate 31m IV x 1 now Increase TPN to 45 mL/hr at 1800 - slowly due to refeeding risk Hold lipid emulsion for first 7 days for critically ill patients per ASPEN  guidelines (Start date 01/25/20) Electrolytes in TPN: 80mEq/L of Na, 40mEq/L of K, 73mEq/L of Ca, 61mEq/L of Mg, and 52mmol/L of Phos. Cl:Ac ratio 1:2 Add standard MVI and trace elements to TPN Continue q4h SSI, adjusted to standard scale per CCM  Reduce MIVF to 30 mL/hr at 1800 Since rate will be >= 42 ml/hr after 18:00, can add pepcid to TPN and stop pepcid boluses tonight Monitor TPN labs on Mon/Thurs, check CMET, Mg and Phos in AM  12m, PharmD, BCPS Pharmacy: [phone] 01/20/2020 7:51 AM

## 2020-01-20 NOTE — Progress Notes (Signed)
NAME:  Darlene Castillo, MRN:  [id number], DOB:  02-Jul-1961, LOS: 2 ADMISSION DATE:  01/18/2020, CONSULTATION DATE:  01/18/20 REFERRING MD:  Darlene Castillo CHIEF COMPLAINT:  VDRF, septic shock  Brief History   Peritonitis 2/2 sigmoid colon perforation s/p ex-lap with resection and end colostomy  History of present illness   Darlene Castillo is a 59 year old woman who presented overnight with abdominal pain, constipation, one episode of vomiting and was found to have a perforated sigmoid colon and peritonitis.  She went to the OR for ex lap and return to the ICU intubated.  Past Medical History  Chronic opiate use Tobacco abuse DM HTN  Significant Hospital Events   5/5 ex lap with partial colectomy and end colostomy  Consults:  CCS PCCM  Procedures:  5/5 ex lap with partial colectomy and end colostomy 5/5 CVC 5/5 A-line  Significant Diagnostic Tests:  CT abdomen pelvis with contrast 01/17/2020-scattered groundglass opacities in the lower lungs bilaterally, no focal opacities.  Abdominal free air.  Multiple dilated loops of bowel with stool.  Large R renal cyst.  Micro Data:  Covid negative  Antimicrobials:  Zosyn 5/5 >>  Interim history/subjective:  Stable overnight. Propofol decreased due to hypertriglyceridemia. This morning she complains of pain in her abdomen but says it is tolerable currently.   Objective   Blood pressure (!) 106/59, pulse 95, temperature 99.2 F (37.3 C), temperature source Axillary, resp. rate (!) 26, height 5\' 1"  (1.549 m), weight 67 kg, last menstrual period 05/23/2013, SpO2 99 %.    Vent Mode: PRVC FiO2 (%):  [50 %-60 %] 50 % Set Rate:  [26 bmp] 26 bmp Vt Set:  [370 mL] 370 mL PEEP:  [10 cmH20] 10 cmH20 Plateau Pressure:  [24 cmH20-26 cmH20] 26 cmH20   Intake/Output Summary (Last 24 hours) at 01/20/2020 03/21/2020 Last data filed at 01/20/2020 03/21/2020 Gross per 24 hour  Intake 2349.88 ml  Output 2245 ml  Net 104.88 ml   Filed Weights   01/18/20 1300  Weight:  67 kg    Examination: General: ill appearing woman laying in bed sleeping comfortably, intubated and lightly sedated HENT: Darlene Castillo/AT, eyes anicteric, ETT and NGT in place Lungs: Breathing synchronously with the vent, no tracheal secretions.  Clear to auscultation bilaterally. Cardiovascular: Regular rate and rhythm, no murmurs Abdomen: Abdomen remains distended, tender to light palpation.  Midline surgical bandage intact, soiled with serous drainage.  Minimal ostomy output, ostomy pink and edematous Extremities: Left radial A-line with good distal perfusion.  No clubbing, cyanosis.  Minimal pedal edema. Neuro: New, moving all extremities on command, nodding to answer questions. GU: Foley in place draining clear yellow urine Derm: No rashes   Resolved Hospital Problem list   Leukopenia 2/2 sepsis Lactic acidosis  Assessment & Plan:    Acute hypoxic respiratory failure requiring mechanical ventilation ARDS- improving. Current P:F 218 on PEEP 8, FiO2 50%. -Ventilation, 4 to 8 cc/kg ideal body weight with goal plateau less than 30 and driving pressure less than 15.  Titrate PEEP and FiO2 per ARDS protocol.  Permissive hypercapnia. -SAT and SBT when appropriate.  Would like to try today hopefully. -Sedation is required to tolerate mechanical ventilation.  Pain control will remain a challenge given her chronic opiate use prior to admission and significant constipation causing abdominal distention. -VAP prevention protocol  Septic shock due to peritonitis due to sigmoid colon perforation from chronic constipation; sedation likely contributing -Continue Zosyn; empiric antifungals is felt unnecessary in those with community-acquired infections with  perforated viscus -Continue vasopressors as required to maintain MAP greater than 65 -Appreciate surgery's assistance. -Appreciate wound care's assistance -Continue TPN -Continue to monitor ostomy and NG output.  Holding enteral feeds until cleared  by surgery.  Chronic methadone use -Continue methadone 7.5 mg Q6h as needed.  Will assess today with sedation weaned if this needs to be increased. -Fentanyl as needed -Continue Precedex for sedation.  Likely will also need low-dose propofol. -Unfortunately unable to add NSAIDs given high risk of AKI and lack of enteral access.  Hypokalemia- resolved -Continue to monitor  Hypophosphatemia -repleted overnight -Continue to monitor  Hypomagnesemia resolved -Continue to monitor  Prediabetes; A1c 6.3. Hyperglycemia.  -Accu-Cheks every 4 hours with sliding scale insulin PRN-increased to normal scale today. -Goal BG 140-180 while admitted to the ICU.    History of tobacco abuse -Will counsel on the importance of quitting when appropriate -Will monitor for signs of obstruction on MV and need for bronchodilators.  Acute anemia- likely due to operative blood loss and volume resuscitation -Continue to monitor  Best practice:  Diet: NPO Pain/Anxiety/Delirium protocol (if indicated): yes VAP protocol (if indicated): yes DVT prophylaxis: SCDs GI prophylaxis: pepcid Glucose control: SSI Mobility: bedrest Code Status: full Family Communication: Family (daughter, husband) have been updated daily at bedside Disposition: ICU  Labs   CBC: Recent Labs  Lab 01/18/20 0525 01/18/20 1101 01/18/20 1141 01/18/20 1340 01/18/20 2040 01/19/20 0158 01/20/20 0500  WBC 8.8  --   --  1.3* 2.3* 2.5* 7.6  NEUTROABS  --   --   --   --   --   --  7.0  HGB 12.6   < > 9.9* 12.7 13.7 12.5 9.9*  HCT 41.1   < > 29.0* 40.4 42.3 38.1 29.7*  MCV 88.6  --   --  90.4 86.0 84.5 84.1  PLT 280  --   --  210 243 217 142*   < > = values in this interval not displayed.    Basic Metabolic Panel: Recent Labs  Lab 01/18/20 0525 01/18/20 1101 01/18/20 1340 01/18/20 2040 01/19/20 0158 01/19/20 0159 01/19/20 0835 01/20/20 0500  NA 141   < > 142 142 142  --  140 142  K 2.5*   < > 3.0* 4.0 3.6  --  4.1  4.2  CL 101  --  112* 118* 115*  --  112* 113*  CO2 26  --  20* 16* 18*  --  20* 22  GLUCOSE 257*  --  141* 195* 181*  --  162* 206*  BUN 17  --  13 16 18   --  16 15  CREATININE 0.89  --  0.78 0.83 0.96  --  1.03* 0.87  CALCIUM 9.0  --  7.1* 7.3* 7.3*  --  7.0* 7.4*  MG 2.2  --   --   --   --  1.5* 2.1 2.2  PHOS  --   --   --   --  1.6*  --  4.3 2.4*   < > = values in this interval not displayed.   GFR: Estimated Creatinine Clearance: 61 mL/min (by C-G formula based on SCr of 0.87 mg/dL). Recent Labs  Lab 01/18/20 0525 01/18/20 1225 01/18/20 1340 01/18/20 1630 01/18/20 2040 01/19/20 0158 01/19/20 0835 01/19/20 1300 01/20/20 0500  WBC   < >  --  1.3*  --  2.3* 2.5*  --   --  7.6  LATICACIDVEN  --  7.3*  --  6.6*  --   --  2.8* 2.1*  --    < > = values in this interval not displayed.    Liver Function Tests: Recent Labs  Lab 01/18/20 0525 01/18/20 1340 01/19/20 0158 01/20/20 0500  AST 26 28 28  33  ALT 14 12 13 13   ALKPHOS 66 26* 24* 31*  BILITOT 0.4 0.8 0.8 0.6  PROT 7.9 4.1* 3.9* 4.5*  ALBUMIN 4.5 2.8* 2.2* 2.2*   Recent Labs  Lab 01/18/20 0525  LIPASE 19   No results for input(s): AMMONIA in the last 168 hours.  ABG    Component Value Date/Time   PHART 7.390 01/19/2020 0425   PCO2ART 29.4 (L) 01/19/2020 0425   PO2ART 98.3 01/19/2020 0425   HCO3 17.4 (L) 01/19/2020 0425   TCO2 19 (L) 01/18/2020 1141   ACIDBASEDEF 6.0 (H) 01/19/2020 0425   O2SAT 97.9 01/19/2020 0425     Coagulation Profile: No results for input(s): INR, PROTIME in the last 168 hours.  Cardiac Enzymes: No results for input(s): CKTOTAL, CKMB, CKMBINDEX, TROPONINI in the last 168 hours.  HbA1C: Hgb A1c MFr Bld  Date/Time Value Ref Range Status  01/18/2020 05:25 AM 6.3 (H) 4.8 - 5.6 % Final    Comment:    (NOTE) Pre diabetes:          5.7%-6.4% Diabetes:              >6.4% Glycemic control for   <7.0% adults with diabetes     CBG: Recent Labs  Lab 01/19/20 1221  01/19/20 1609 01/19/20 2051 01/20/20 0006 01/20/20 0348  GLUCAP 162* 144* 166* 177* 180*   This patient is critically ill with multiple organ system failure which requires frequent high complexity decision making, assessment, support, evaluation, and titration of therapies. This was completed through the application of advanced monitoring technologies and extensive interpretation of multiple databases. During this encounter critical care time was devoted to patient care services described in this note for 39 minutes.   Julian Hy, DO 01/20/20 8:15 AM [facility]

## 2020-01-20 NOTE — Progress Notes (Signed)
2 Days Post-Op   Subjective/Chief Complaint: sedated on vent  On vent sedated    Objective: Vital signs in last 24 hours: Temp:  [98.4 F (36.9 C)-99.4 F (37.4 C)] 99.4 F (37.4 C) (05/07 0800) Pulse Rate:  [92-131] 95 (05/07 0645) Resp:  [20-26] 26 (05/07 0645) BP: (71-106)/(46-59) 106/59 (05/07 0400) SpO2:  [96 %-99 %] 99 % (05/07 0744) Arterial Line BP: (89-130)/(49-67) 108/65 (05/07 0645) FiO2 (%):  [50 %-60 %] 50 % (05/07 0744) Last BM Date: (UTA)  Intake/Output from previous day: 05/06 0701 - 05/07 0700 In: 2349.9 [I.V.:1993; IV Piggyback:356.9] Out: 2245 [Urine:2050; Emesis/NG output:150; Stool:45] Intake/Output this shift: No intake/output data recorded.   HEENT: ETT and NGT in place Card:  Tachycardic, no M/G/R heard, 2+ DP pulses Pulm:  CTAB, no W/R/R, mechanically ventilated GU: foley with yellow urine Abd: distended, hypoactive BS, open midline incision pink without erythema or drainage, ostomy viable with no stool or air in bag Lab Results:  Recent Labs    01/19/20 0158 01/20/20 0500  WBC 2.5* 7.6  HGB 12.5 9.9*  HCT 38.1 29.7*  PLT 217 142*   BMET Recent Labs    01/19/20 0835 01/20/20 0500  NA 140 142  K 4.1 4.2  CL 112* 113*  CO2 20* 22  GLUCOSE 162* 206*  BUN 16 15  CREATININE 1.03* 0.87  CALCIUM 7.0* 7.4*   PT/INR No results for input(s): LABPROT, INR in the last 72 hours. ABG Recent Labs    01/19/20 0425 01/20/20 0729  PHART 7.390 7.431  HCO3 17.4* 19.6*    Studies/Results: DG CHEST PORT 1 VIEW  Result Date: 01/18/2020 CLINICAL DATA:  Hypoxia.  Postoperative sigmoid perforation EXAM: PORTABLE CHEST 1 VIEW COMPARISON:  None. FINDINGS: Endotracheal tube tip is 8 mm above the carina. Nasogastric tube tip and side port in stomach. Central catheter tip is at the cavoatrial junction. No pneumothorax. There is bibasilar atelectasis. Heart size and pulmonary vascularity are normal. No adenopathy. No bone lesions. IMPRESSION: Tube and  catheter positions as described without pneumothorax. Note that the endotracheal tube tip is near the carina; advise withdrawing endotracheal tube approximately 2.5 cm. Bibasilar atelectasis. Developing pneumonia or possibly aspiration in the bases cannot be excluded. Heart size normal. Electronically Signed   By: Bretta Bang III M.D.   On: 01/18/2020 14:33   [place] EKG SITE RITE  Result Date: 01/19/2020 If Site Rite image not attached, placement could not be confirmed due to current cardiac rhythm.   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 Luisa Hart - POD#1 - surgical path pending - BID wet to dry dressing changes to midline abdominal wound, may  transition to vac in the future - WOC RN consult for new colostomy - expect prolonged ileus so we will go ahead and start TPN for nutrition, continue NPO/NGT to LIWS - continue IV zosyn, will discuss with MD if we need to start antifungal - appreciate CCM assistance  ID - zosyn 5/5>>day#3 FEN - IVF, NPO/NGT to LIWS, start TPN VTE - SCDs, lovenox Foley -  continue for strict I&O Follow up - TBD  LOS: 2 days    Marcello Moores A Draiden Mirsky 01/20/2020

## 2020-01-20 NOTE — Consult Note (Signed)
WOC Nurse ostomy follow up Stoma type/location: LLQ colostomy Stomal assessment/size:  1 3/4" less edematous, pale pink.  Ongoing ileus. No stool or gas noted in pouch.  Hypoactive bowel sounds.  Peristomal assessment: pouch intact Treatment options for stomal/peristomal skin: none Output  Ostomy pouching: 2pc. 2 3/4" pouch Education provided: daughter at bedside.  INformed her I was assessing stoma. It was pink and moist but no output.  Irrigation supplies have been delivered to room. Patient is more arousable today and pain is managed.  Enrolled patient in [place] Secure Start Discharge program: No WOC team will follow.  Maple Hudson MSN, RN, FNP-BC CWON Wound, Ostomy, Continence Nurse Pager [phone]

## 2020-01-20 NOTE — TOC Progression Note (Signed)
Transition of Care [facility]) - Progression Note    Patient Details  Name: Darlene Castillo MRN: [id number] Date of Birth: 10/22/1960  Transition of [facility]) CM/SW Contact  Golda Acre, RN Phone Number: 01/20/2020, 7:46 AM  Clinical Narrative:    Remains on the vent, iv sedation, iv pressors, iv abx, iv tpn.   Expected Discharge Plan: Home/Self Care Barriers to Discharge: Continued Medical Work up  Expected Discharge Plan and Services Expected Discharge Plan: Home/Self Care       Living arrangements for the past 2 months: Single Family Home                                       Social Determinants of Health (SDOH) Interventions    Readmission Risk Interventions No flowsheet data found.

## 2020-01-20 NOTE — Progress Notes (Signed)
AM labs showed Triglycerides 331 (previously 27). Propofol was already stopped by this RN as part of AM weaning protocol; E-link RN notified. E-link also notified about phos 2.4; Dr. Arsenio Loader did not order any replacement d/t TPN containing K phos. This RN will continue to monitor pt.

## 2020-01-20 NOTE — Progress Notes (Signed)
eLink Physician-Brief Progress Note Patient Name: Darlene Castillo DOB: 07-12-1961 MRN: [id number]   Date of Service  01/20/2020  HPI/Events of Note  Multiple issues: 1. Triglycerides = 331 and 2. PO4--- = 2.4 - Patient has TPN running with Potassium Phosphate in it.   eICU Interventions  Will order: 1. D/C Propofol IV infusion.  2. Titrate Precedex IV infusion as needed.      Intervention Category Major Interventions: Other:;Electrolyte abnormality - evaluation and management  Abbigayle Toole Eugene 01/20/2020, 6:09 AM

## 2020-01-21 ENCOUNTER — Inpatient Hospital Stay (HOSPITAL_COMMUNITY): Payer: BLUE CROSS/BLUE SHIELD

## 2020-01-21 DIAGNOSIS — J95821 Acute postprocedural respiratory failure: Secondary | ICD-10-CM

## 2020-01-21 DIAGNOSIS — K631 Perforation of intestine (nontraumatic): Principal | ICD-10-CM

## 2020-01-21 DIAGNOSIS — Z978 Presence of other specified devices: Secondary | ICD-10-CM

## 2020-01-21 DIAGNOSIS — J9601 Acute respiratory failure with hypoxia: Secondary | ICD-10-CM

## 2020-01-21 LAB — GLUCOSE, CAPILLARY
Glucose-Capillary: 108 mg/dL — ABNORMAL HIGH (ref 70–99)
Glucose-Capillary: 122 mg/dL — ABNORMAL HIGH (ref 70–99)
Glucose-Capillary: 126 mg/dL — ABNORMAL HIGH (ref 70–99)
Glucose-Capillary: 140 mg/dL — ABNORMAL HIGH (ref 70–99)
Glucose-Capillary: 89 mg/dL (ref 70–99)
Glucose-Capillary: 97 mg/dL (ref 70–99)

## 2020-01-21 LAB — TRIGLYCERIDES: Triglycerides: 434 mg/dL — ABNORMAL HIGH (ref <150)

## 2020-01-21 LAB — BLOOD GAS, ARTERIAL
Acid-Base Excess: 1.5 mmol/L (ref 0.0–2.0)
Bicarbonate: 25.1 mmol/L (ref 20.0–28.0)
Drawn by: 270211
FIO2: 40
MECHVT: 330 mL
O2 Saturation: 92.3 %
Patient temperature: 98.6
RATE: 26 resp/min
pCO2 arterial: 38.3 mmHg (ref 32.0–48.0)
pH, Arterial: 7.433 (ref 7.350–7.450)
pO2, Arterial: 67.1 mmHg — ABNORMAL LOW (ref 83.0–108.0)

## 2020-01-21 LAB — COMPREHENSIVE METABOLIC PANEL
ALT: 16 U/L (ref 0–44)
AST: 54 U/L — ABNORMAL HIGH (ref 15–41)
Albumin: 2.2 g/dL — ABNORMAL LOW (ref 3.5–5.0)
Alkaline Phosphatase: 47 U/L (ref 38–126)
Anion gap: 6 (ref 5–15)
BUN: 16 mg/dL (ref 6–20)
CO2: 24 mmol/L (ref 22–32)
Calcium: 8 mg/dL — ABNORMAL LOW (ref 8.9–10.3)
Chloride: 110 mmol/L (ref 98–111)
Creatinine, Ser: 0.75 mg/dL (ref 0.44–1.00)
GFR calc Af Amer: >60 mL/min (ref >60)
GFR calc non Af Amer: >60 mL/min (ref >60)
Glucose, Bld: 127 mg/dL — ABNORMAL HIGH (ref 70–99)
Potassium: 3.9 mmol/L (ref 3.5–5.1)
Sodium: 140 mmol/L (ref 135–145)
Total Bilirubin: 0.7 mg/dL (ref 0.3–1.2)
Total Protein: 5 g/dL — ABNORMAL LOW (ref 6.5–8.1)

## 2020-01-21 LAB — PHOSPHORUS: Phosphorus: 2.2 mg/dL — ABNORMAL LOW (ref 2.5–4.6)

## 2020-01-21 LAB — MAGNESIUM: Magnesium: 2 mg/dL (ref 1.7–2.4)

## 2020-01-21 MED ORDER — SODIUM PHOSPHATES 45 MMOLE/15ML IV SOLN
10.0000 mmol | Freq: Once | INTRAVENOUS | Status: AC
  Administered 2020-01-21: 10 mmol via INTRAVENOUS
  Filled 2020-01-21: qty 3.33

## 2020-01-21 MED ORDER — TRAVASOL 10 % IV SOLN
INTRAVENOUS | Status: AC
  Filled 2020-01-21: qty 936

## 2020-01-21 MED ORDER — ALBUTEROL SULFATE (2.5 MG/3ML) 0.083% IN NEBU
2.5000 mg | INHALATION_SOLUTION | Freq: Four times a day (QID) | RESPIRATORY_TRACT | Status: DC
  Administered 2020-01-21 – 2020-02-12 (×89): 2.5 mg via RESPIRATORY_TRACT
  Filled 2020-01-21 (×89): qty 3

## 2020-01-21 MED ORDER — LACTATED RINGERS IV SOLN: INTRAVENOUS | Status: AC

## 2020-01-21 NOTE — Progress Notes (Signed)
Pharmacy Antibiotic Note  Darlene Castillo is a 59 y.o. female admitted on 01/18/2020 with sigmoid colon perforation.  Pharmacy has been consulted for zosyn dosing. 01/21/2020  D#4 Zosyn, WBC WNL, AF, SCr WNL  Plan: Zosyn 3.375g IV q8h (4 hour infusion).  Height: 5\' 1"  (154.9 cm) Weight: 67 kg (147 lb 11.3 oz) IBW/kg (Calculated) : 47.8  Temp (24hrs), Avg:97.9 F (36.6 C), Min:97 F (36.1 C), Max:98.7 F (37.1 C)  Recent Labs  Lab 01/18/20 0525 01/18/20 0525 01/18/20 1225 01/18/20 1340 01/18/20 1340 01/18/20 1630 01/18/20 2040 01/19/20 0158 01/19/20 0835 01/19/20 1300 01/20/20 0500 01/21/20 0430  WBC 8.8  --   --  1.3*  --   --  2.3* 2.5*  --   --  7.6  --   CREATININE 0.89   < >  --  0.78   < >  --  0.83 0.96 1.03*  --  0.87 0.75  LATICACIDVEN  --   --  7.3*  --   --  6.6*  --   --  2.8* 2.1*  --   --    < > = values in this interval not displayed.    Estimated Creatinine Clearance: 66.3 mL/min (by C-G formula based on SCr of 0.75 mg/dL).    Allergies  Allergen Reactions  . Ibuprofen Itching    Antimicrobials this admission: 5/5 zosyn>> Dose adjustments this admission:  Microbiology results: 5/5 flu/covid: neg 5/5 MRSA neg 5/5 HIV NR  Thank you for allowing pharmacy to be a part of this patient's care.  03/22/20, Pharm.D 01/21/2020 8:19 AM

## 2020-01-21 NOTE — Progress Notes (Signed)
NAME:  Darlene Castillo, MRN:  [id number], DOB:  04/02/1961, LOS: 3 ADMISSION DATE:  01/18/2020, CONSULTATION DATE:  01/18/20 REFERRING MD:  Erroll Luna CHIEF COMPLAINT:  VDRF, septic shock  Brief History   Peritonitis 2/2 sigmoid colon perforation s/p ex-lap 5/5 with resection and end colostomy.  History of present illness   Darlene Castillo is a 84 yobf smoker  who presented early am 5/5  with abdominal pain, constipation, one episode of vomiting and was found to have a perforated sigmoid colon and peritonitis.  She went to the OR for ex lap and return to the ICU intubated.  Past Medical History  Chronic opiate use Tobacco abuse DM HTN  Significant Hospital Events   5/5 ex lap with partial colectomy and end colostomy  Consults:  [facility] PCCM  Procedures:  5/5 CVC 5/5 A-line 5/5 ET  Significant Diagnostic Tests:  CT abdomen pelvis with contrast 01/17/2020-scattered groundglass opacities in the lower lungs bilaterally, no focal opacities.  Abdominal free air.  Multiple dilated loops of bowel with stool.  Large R renal cyst. Path 5/5 >>>   Micro Data:  Covid negative  Antimicrobials:  Zosyn 5/5 >>   Scheduled Meds: . chlorhexidine gluconate (MEDLINE KIT)  15 mL Mouth Rinse BID  . Chlorhexidine Gluconate Cloth  6 each Topical Daily  . enoxaparin (LOVENOX) injection  40 mg Subcutaneous Q24H  . insulin aspart  2-6 Units Subcutaneous Q4H  . ipratropium-albuterol  3 mL Nebulization Q4H  . mouth rinse  15 mL Mouth Rinse 10 times per day  . methadone  7.5 mg Intravenous Q6H  . nicotine  7 mg Transdermal Daily  . sodium chloride flush  10-40 mL Intracatheter Q12H   Continuous Infusions: . dexmedetomidine (PRECEDEX) IV infusion 0.6 mcg/kg/hr (01/21/20 0627)  . fentaNYL infusion INTRAVENOUS 75 mcg/hr (01/21/20 0400)  . lactated ringers 30 mL/hr at 01/21/20 0400  . lactated ringers    . piperacillin-tazobactam (ZOSYN)  IV 3.375 g (01/21/20 0549)  . sodium phosphate  Dextrose 5% IVPB 10  mmol (01/21/20 0709)  . TPN ADULT (ION) 45 mL/hr at 01/21/20 0400  . TPN ADULT (ION)     PRN Meds:.fentaNYL (SUBLIMAZE) injection, olopatadine, sodium chloride flush  Interim history/subjective:  Still lots of pain  And abd quite distended/ tense   Objective   Blood pressure 104/72, pulse (!) 108, temperature 97.6 F (36.4 C), temperature source Axillary, resp. rate (!) 23, height _0  (1.549 m), weight 67 kg, last menstrual period 05/23/2013, SpO2 98 %.    Vent Mode: PRVC FiO2 (%):  [40 %] 40 % Set Rate:  [15 bmp] 15 bmp Vt Set:  [370 mL] 370 mL PEEP:  [8 cmH20] 8 cmH20 Pressure Support:  [12 cmH20] 12 cmH20 Plateau Pressure:  [23 cmH20-26 cmH20] 26 cmH20   Intake/Output Summary (Last 24 hours) at 01/21/2020 0857 Last data filed at 01/21/2020 0616 Gross per 24 hour  Intake 2416.49 ml  Output 2010 ml  Net 406.49 ml   Filed Weights   01/18/20 1300  Weight: 67 kg       Examination: Tmax 99 with high RR on PSV Pt alert, approp nad @ 30 degrees hob No jvd Oropharynx  ET  Neck supple Lungs with min cattered exp > insp rhonchi bilaterally RRR no s3 or or sign murmur Abd distened  decreased bs, minimal bowel sounds/  NG with 50 cc last shift Extr warm with no edema or clubbing noted/  PAS both legs Neuro  Appears alert/  anxious, moves all 4 f/c         Resolved Hospital Problem list   Leukopenia 2/2 sepsis Lactic acidosis  Assessment & Plan:    Acute hypoxic respiratory failure requiring mechanical ventilation with very poor Cabd clinically  ARDS-   -Sedation is required to tolerate mechanical ventilation.  Pain control will remain a challenge given her chronic opiate use prior to admission and significant constipation causing abdominal distention. -VAP prevention protocol - need to see if does ok on plain wall T-bar before considering extubation  >>> cxr am 5/9   Septic shock due to peritonitis due to sigmoid colon perforation from chronic constipation; sedation  likely contributing/ sepsis clinically  resolved -Continue Zosyn;  -Appreciate surgery's assistance. -Appreciate wound care's assistance -Continue TPN -Continue to monitor ostomy and NG output.  - no need for pressors/ check  cvp if low bp or uop to guide volume rx    Chronic methadone use -Continue methadone 7.5 mg Q6h as needed.  Will assess today with sedation weaned if this needs to be increased. -Fentanyl as needed -Continue Precedex for sedation.       Hypokalemia- resolved -Continue to monitor  Hypophosphatemia -Continue to monitor  Hypomagnesemia resolved -Continue to monitor  Prediabetes; A1c 6.3. Hyperglycemia.  -Accu-Cheks every 4 hours with sliding scale insulin PRN-increased to normal scale 5/7 -Goal BG 140-180 while admitted to the ICU.    History of tobacco abuse  - d/c atrovent given apparent ileus am 5/8   Acute anemia- likely due to operative blood loss and volume resuscitation   Lab Results  Component Value Date   HGB 9.9 (L) 01/20/2020   HGB 12.5 01/19/2020   HGB 13.7 01/18/2020     Best practice:  Diet: NPO Pain/Anxiety/Delirium protocol (if indicated): yes VAP protocol (if indicated): yes DVT prophylaxis: SCDs and lovenox GI prophylaxis: pepcid Glucose control: SSI Mobility: bedrest Code Status: full Family Communication: Family at bedside am 5/8   Disposition: ICU  Labs   CBC: Recent Labs  Lab 01/18/20 0525 01/18/20 1101 01/18/20 1141 01/18/20 1340 01/18/20 2040 01/19/20 0158 01/20/20 0500  WBC 8.8  --   --  1.3* 2.3* 2.5* 7.6  NEUTROABS  --   --   --   --   --   --  7.0  HGB 12.6   < > 9.9* 12.7 13.7 12.5 9.9*  HCT 41.1   < > 29.0* 40.4 42.3 38.1 29.7*  MCV 88.6  --   --  90.4 86.0 84.5 84.1  PLT 280  --   --  210 243 217 142*   < > = values in this interval not displayed.    Basic Metabolic Panel: Recent Labs  Lab 01/18/20 0525 01/18/20 1101 01/18/20 2040 01/19/20 0158 01/19/20 0159 01/19/20 0835 01/20/20 0500  01/21/20 0430  NA 141   < > 142 142  --  140 142 140  K 2.5*   < > 4.0 3.6  --  4.1 4.2 3.9  CL 101   < > 118* 115*  --  112* 113* 110  CO2 26   < > 16* 18*  --  20* 22 24  GLUCOSE 257*   < > 195* 181*  --  162* 206* 127*  BUN 17   < > 16 18  --  _0 CREATININE 0.89   < > 0.83 0.96  --  1.03* 0.87 0.75  CALCIUM 9.0   < > 7.3* 7.3*  --  7.0* 7.4* 8.0*  MG 2.2  --   --   --  1.5* 2.1 2.2 2.0  PHOS  --   --   --  1.6*  --  4.3 2.4* 2.2*   < > = values in this interval not displayed.   GFR: Estimated Creatinine Clearance: 66.3 mL/min (by C-G formula based on SCr of 0.75 mg/dL). Recent Labs  Lab 01/18/20 0525 01/18/20 1225 01/18/20 1340 01/18/20 1630 01/18/20 2040 01/19/20 0158 01/19/20 0835 01/19/20 1300 01/20/20 0500  WBC   < >  --  1.3*  --  2.3* 2.5*  --   --  7.6  LATICACIDVEN  --  7.3*  --  6.6*  --   --  2.8* 2.1*  --    < > = values in this interval not displayed.    Liver Function Tests: Recent Labs  Lab 01/18/20 0525 01/18/20 1340 01/19/20 0158 01/20/20 0500 01/21/20 0430  AST _0 33 54*  ALT _1 ALKPHOS 66 26* 24* 31* 47  BILITOT 0.4 0.8 0.8 0.6 0.7  PROT 7.9 4.1* 3.9* 4.5* 5.0*  ALBUMIN 4.5 2.8* 2.2* 2.2* 2.2*   Recent Labs  Lab 01/18/20 0525  LIPASE 19   No results for input(s): AMMONIA in the last 168 hours.  ABG    Component Value Date/Time   PHART 7.431 01/20/2020 0729   PCO2ART 30.0 (L) 01/20/2020 0729   PO2ART 109 (H) 01/20/2020 0729   HCO3 19.6 (L) 01/20/2020 0729   TCO2 19 (L) 01/18/2020 1141   ACIDBASEDEF 3.6 (H) 01/20/2020 0729   O2SAT 98.1 01/20/2020 0729     Coagulation Profile: No results for input(s): INR, PROTIME in the last 168 hours.  Cardiac Enzymes: No results for input(s): CKTOTAL, CKMB, CKMBINDEX, TROPONINI in the last 168 hours.  HbA1C: Hgb A1c MFr Bld  Date/Time Value Ref Range Status  01/18/2020 05:25 AM 6.3 (H) 4.8 - 5.6 % Final    Comment:    (NOTE) Pre diabetes:           5.7%-6.4% Diabetes:              >6.4% Glycemic control for   <7.0% adults with diabetes     CBG: Recent Labs  Lab 01/20/20 1620 01/20/20 1928 01/20/20 2341 01/21/20 0348 01/21/20 0757  GLUCAP 129* 89 111* 122* 89     The patient is critically ill with multiple organ systems failure and requires high complexity decision making for assessment and support, frequent evaluation and titration of therapies, application of advanced monitoring technologies and extensive interpretation of multiple databases. Critical Care Time devoted to patient care services described in this note is 40 minutes.    Christinia Gully, MD Pulmonary and [place] [phone] After 6:00 PM or weekends, use Beeper [phone]  After 7:00 pm call Elink  [phone]

## 2020-01-21 NOTE — Progress Notes (Signed)
3 Days Post-Op   Subjective/Chief Complaint: Awake and alert. On vent. Complains of abd pain   Objective: Vital signs in last 24 hours: Temp:  [97 F (36.1 C)-99.4 F (37.4 C)] 97.6 F (36.4 C) (05/08 0400) Pulse Rate:  [87-123] 108 (05/08 0618) Resp:  [18-33] 23 (05/08 0618) BP: (82-148)/(39-101) 104/72 (05/08 0618) SpO2:  [90 %-100 %] 98 % (05/08 0738) Arterial Line BP: (124)/(60) 124/60 (05/07 0800) FiO2 (%):  [40 %-50 %] 40 % (05/08 0738) Last BM Date: (UTA)  Intake/Output from previous day: 05/07 0701 - 05/08 0700 In: 2416.5 [I.V.:1987.2; IV Piggyback:429.3] Out: 2010 [Urine:1950; Emesis/NG output:50; Stool:10] Intake/Output this shift: No intake/output data recorded.  General appearance: alert and cooperative Resp: clear to auscultation bilaterally and on vent Cardio: regular rate and rhythm GI: soft, moderate tenderness. ostomy pink with no output. wound clean and intact fascia  Lab Results:  Recent Labs    01/19/20 0158 01/20/20 0500  WBC 2.5* 7.6  HGB 12.5 9.9*  HCT 38.1 29.7*  PLT 217 142*   BMET Recent Labs    01/20/20 0500 01/21/20 0430  NA 142 140  K 4.2 3.9  CL 113* 110  CO2 22 24  GLUCOSE 206* 127*  BUN 15 16  CREATININE 0.87 0.75  CALCIUM 7.4* 8.0*   PT/INR No results for input(s): LABPROT, INR in the last 72 hours. ABG Recent Labs    01/19/20 0425 01/20/20 0729  PHART 7.390 7.431  HCO3 17.4* 19.6*    Studies/Results: [place] EKG SITE RITE  Result Date: 01/19/2020 If Site Rite image not attached, placement could not be confirmed due to current cardiac rhythm.   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 Luisa Hart - POD#4 - surgical path pending - BID wet to dry dressing changes to midline abdominal wound, may transition to vac in the future - WOC RN consult for new colostomy - expect prolonged ileus so we will go ahead and start TPN for nutrition, continue NPO/NGT to LIWS - continue IV zosyn, will discuss with MD if we need to start antifungal - appreciate CCM assistance  ID -zosyn 5/5>>day#4 FEN -IVF, NPO/NGT to LIWS, start TPN VTE -SCDs, lovenox Foley -continue for strict I&O Follow up -TBD  LOS: 3 days    Darlene Castillo 01/21/2020

## 2020-01-21 NOTE — Progress Notes (Signed)
PHARMACY - TOTAL PARENTERAL NUTRITION CONSULT NOTE   Indication: Prolonged ileus  Patient Measurements: Height: 5\' 1"  (154.9 cm) Weight: 67 kg (147 lb 11.3 oz) IBW/kg (Calculated) : 47.8   Body mass index is 27.91 kg/m. Usual Weight: 67 kg  Assessment: 59 yo F s/p Sigmoid colon perforation secondary to chronic constipation and ulceration with feculent peritonitis and gross contamination of abdominal cavity with stool balls  -S/p Exploratory laparotomy with partial colectomy and end colostomy 5/5 Dr. 46 5/6 PICC placed and started TPN for nutrition support; expect prolonged ileus  Glucose / Insulin: prediabetes, A1c 6.3, got dex 10 mg 5/5@ 1150 am, CBGs 98-151, serum glucose 127 (10 units SSI/24hr) Electrolytes: phos 2.2 s/p Naphos 51mM yesterday, Mag 2, bolus; K 3.9; CoCa 9.4. Renal: Scr WNL, 1950cc UOP over past 24hr LFTs / TGs: AST sl elevated at 54;, Trig 27> 331> 434, propofol dc'd 5/7 and no lipids in TPN Prealbumin / albumin: prealbumin 9.7 5/6 reflects inflammation of post-op stress and critical illness, admit alb 4.5 WNL, now 2.2 post-op, BMI 27.9, well-nourished PTA, poor PO intake for 5 days PTA Intake / Output; MIVF: LR at 30 ml/hr, fent at 75 mcg/hr; precedex at 0.6 mcg/kg/hr GI Imaging: Surgeries / Procedures:   -S/p Exploratory laparotomy with partial colectomy and end colostomy 5/5 Dr. 7/7 access: PICC 5/6 for TPN TPN start date: 01/19/2020  Nutritional Goals (per RD recommendation on 01/19/2020): kCal: 1584, Protein: 114-130, Fluid: >= 1.5 L/day  Goal TPN rate is 75 mL/hr (provides 117 g of protein and ~1570 kcals per day) without lipids Current Nutrition:  TPN at 84ml/hr  Plan:  Sodium phosphate 48m IV x 1 now ordered by MD Increase TPN to 60 mL/hr at 1800 - slowly due to refeeding risk Hold lipid emulsion for first 7 days for critically ill patients per ASPEN guidelines (Start date 01/25/20) Electrolytes in TPN: 78mEq/L of Na, 60mEq/L of K,  27mEq/L of Ca, 39mEq/L of Mg, and 21mmol/L of Phos(max) . Cl:Ac ratio 1:1 Add standard MVI and trace elements to TPN Continue q4h SSI, adjusted to standard scale per CCM  Reduce MIVF to 15 mL/hr at 1800 Continue pepcid 40 mg/day in TPN Monitor TPN labs on Mon/Thurs, check CMET, Mg and Phos in AM  32m, Pharm.D 01/21/2020 8:28 AM Pharmacy: 03/22/2020 01/21/2020 8:26 AM

## 2020-01-22 LAB — PHOSPHORUS: Phosphorus: 4.3 mg/dL (ref 2.5–4.6)

## 2020-01-22 LAB — CBC
HCT: 26.4 % — ABNORMAL LOW (ref 36.0–46.0)
Hemoglobin: 8.9 g/dL — ABNORMAL LOW (ref 12.0–15.0)
MCH: 27.9 pg (ref 26.0–34.0)
MCHC: 33.7 g/dL (ref 30.0–36.0)
MCV: 82.8 fL (ref 80.0–100.0)
Platelets: 148 10*3/uL — ABNORMAL LOW (ref 150–400)
RBC: 3.19 MIL/uL — ABNORMAL LOW (ref 3.87–5.11)
RDW: 13.5 % (ref 11.5–15.5)
WBC: 9.3 10*3/uL (ref 4.0–10.5)
nRBC: 0.2 % (ref 0.0–0.2)

## 2020-01-22 LAB — COMPREHENSIVE METABOLIC PANEL
ALT: 19 U/L (ref 0–44)
AST: 59 U/L — ABNORMAL HIGH (ref 15–41)
Albumin: 1.9 g/dL — ABNORMAL LOW (ref 3.5–5.0)
Alkaline Phosphatase: 69 U/L (ref 38–126)
Anion gap: 10 (ref 5–15)
BUN: 17 mg/dL (ref 6–20)
CO2: 24 mmol/L (ref 22–32)
Calcium: 8 mg/dL — ABNORMAL LOW (ref 8.9–10.3)
Chloride: 104 mmol/L (ref 98–111)
Creatinine, Ser: 0.72 mg/dL (ref 0.44–1.00)
GFR calc Af Amer: >60 mL/min (ref >60)
GFR calc non Af Amer: >60 mL/min (ref >60)
Glucose, Bld: 128 mg/dL — ABNORMAL HIGH (ref 70–99)
Potassium: 3.7 mmol/L (ref 3.5–5.1)
Sodium: 138 mmol/L (ref 135–145)
Total Bilirubin: 0.5 mg/dL (ref 0.3–1.2)
Total Protein: 5.2 g/dL — ABNORMAL LOW (ref 6.5–8.1)

## 2020-01-22 LAB — GLUCOSE, CAPILLARY
Glucose-Capillary: >600 mg/dL (ref 70–99)
Glucose-Capillary: 120 mg/dL — ABNORMAL HIGH (ref 70–99)
Glucose-Capillary: 179 mg/dL — ABNORMAL HIGH (ref 70–99)
Glucose-Capillary: 139 mg/dL — ABNORMAL HIGH (ref 70–99)
Glucose-Capillary: 127 mg/dL — ABNORMAL HIGH (ref 70–99)

## 2020-01-22 LAB — MAGNESIUM: Magnesium: 1.7 mg/dL (ref 1.7–2.4)

## 2020-01-22 LAB — TRIGLYCERIDES: Triglycerides: 270 mg/dL — ABNORMAL HIGH (ref <150)

## 2020-01-22 MED ORDER — MAGNESIUM SULFATE 2 GM/50ML IV SOLN
2.0000 g | Freq: Once | INTRAVENOUS | Status: AC
  Administered 2020-01-22: 2 g via INTRAVENOUS
  Filled 2020-01-22: qty 50

## 2020-01-22 MED ORDER — TRAVASOL 10 % IV SOLN
INTRAVENOUS | Status: AC
  Filled 2020-01-22: qty 1170

## 2020-01-22 MED ORDER — POTASSIUM CHLORIDE 10 MEQ/50ML IV SOLN
10.0000 meq | INTRAVENOUS | Status: AC
  Administered 2020-01-22 (×2): 10 meq via INTRAVENOUS
  Filled 2020-01-22: qty 50

## 2020-01-22 NOTE — Progress Notes (Signed)
NAME:  Darlene Castillo, MRN:  [id number], DOB:  Sep 20, 1960, LOS: 4 ADMISSION DATE:  01/18/2020, CONSULTATION DATE:  01/18/20 REFERRING MD:  Erroll Luna CHIEF COMPLAINT:  VDRF, septic shock  Brief History   Peritonitis 2/2 sigmoid colon perforation s/p ex-lap 5/5 with resection and end colostomy.  History of present illness   Darlene Castillo is a 8 yobf smoker  who presented early am 5/5  with abdominal pain, constipation, one episode of vomiting and was found to have a perforated sigmoid colon and peritonitis.  She went to the OR for ex lap and return to the ICU intubated.  Past Medical History  Chronic opiate use Tobacco abuse DM HTN  Significant Hospital Events   5/5 ex lap with partial colectomy and end colostomy for perf:  neg path    Consults:  CCS PCCM  Procedures:  5/5 CVL RIJ 5/5 A-line 5/7  5/5 ET 5/6 PIC L brachial   Significant Diagnostic Tests:  CT abdomen pelvis with contrast 01/17/2020-scattered groundglass opacities in the lower lungs bilaterally, no focal opacities.  Abdominal free air.  Multiple dilated loops of bowel with stool.  Large R renal cyst. Path 5/5 >>> - Perforation with acute inflammation and acute serositis. No evidence of malignancy.    Micro Data:  Covid negative  Antimicrobials:  Zosyn 5/5  >>   Scheduled Meds: . albuterol  2.5 mg Nebulization Q6H  . chlorhexidine gluconate (MEDLINE KIT)  15 mL Mouth Rinse BID  . Chlorhexidine Gluconate Cloth  6 each Topical Daily  . enoxaparin (LOVENOX) injection  40 mg Subcutaneous Q24H  . insulin aspart  2-6 Units Subcutaneous Q4H  . mouth rinse  15 mL Mouth Rinse 10 times per day  . methadone  7.5 mg Intravenous Q6H  . nicotine  7 mg Transdermal Daily  . sodium chloride flush  10-40 mL Intracatheter Q12H   Continuous Infusions: . dexmedetomidine (PRECEDEX) IV infusion 0.7 mcg/kg/hr (01/22/20 0800)  . fentaNYL infusion INTRAVENOUS 125 mcg/hr (01/22/20 0951)  . lactated ringers 15 mL/hr at 01/22/20  0600  . piperacillin-tazobactam (ZOSYN)  IV 3.375 g (01/22/20 0609)  . potassium chloride    . TPN ADULT (ION) 60 mL/hr at 01/22/20 0600  . TPN ADULT (ION)     PRN Meds:.fentaNYL (SUBLIMAZE) injection, olopatadine, sodium chloride flush    Interim history/subjective:  Still needing fentanyl/precedex drips very anxious with weaning attempts  Objective   Blood pressure 123/77, pulse (!) 108, temperature 99.2 F (37.3 C), temperature source Axillary, resp. rate (!) 23, height 5' 1"  (1.549 m), weight 67 kg, last menstrual period 05/23/2013, SpO2 97 %.    Vent Mode: PRVC FiO2 (%):  [40 %] 40 % Set Rate:  [26 bmp] 26 bmp Vt Set:  [330 mL] 330 mL PEEP:  [5 cmH20-8 cmH20] 5 cmH20 Pressure Support:  [5 cmH20] 5 cmH20 Plateau Pressure:  [19 cmH20-25 cmH20] 19 cmH20   Intake/Output Summary (Last 24 hours) at 01/22/2020 0958 Last data filed at 01/22/2020 0600 Gross per 24 hour  Intake 2311.52 ml  Output 2900 ml  Net -588.48 ml   Filed Weights   01/18/20 1300  Weight: 67 kg       Examination: Tmax 100.1  Pt alert, approp nad @ 30 degrees hob No jvd Oropharynx with ET  Neck supple Lungs with distant bs bilaterally RRR no s3 or or sign murmur Abd quite distended, mod tense, no excursion on insp Extr warm with no edema or clubbing noted Neuro  Sensorium alert,  no apparent motor deficits     I personally reviewed images and agree with radiology impression as follows:  pCXR:  5/8 Interval increase in ill-defined opacity within the mid to lower left lung which may reflect pneumonia, edema and/or atelectasis.  Minimal atelectasis within the right lung base.  Resolved Hospital Problem list   Leukopenia 2/2 sepsis Lactic acidosis  Assessment & Plan:    Acute hypoxic respiratory failure requiring mechanical ventilation with very poor Cabd   -Sedation is required to tolerate mechanical ventilation.  Pain control will remain a challenge given her chronic opiate use prior to  admission and significant constipation causing abdominal distention. -VAP prevention protocol - need to see if does ok on plain wall T-bar before considering extubation but clearly not ready yet    Septic shock due to peritonitis due to sigmoid colon perforation from chronic constipation; sedation likely contributing/ sepsis clinically  resolved  >>>  Zosyn empirical rx post perf per flowsheet above >>>   recheck pct 5/10 given ? Of VAP to make sure still trending down.  Possible VAP LLL (vs aspiration vs layering effusion posteriorly >>  Zosyn best rx / consider ct chest and abd if clinically deteriorating but hold off for now       Chronic methadone use -Continue methadone 7.5 mg Q6h as needed.  -Fentanyl drip -Continue Precedex for sedation.       Hypokalemia- resolved -Continue to monitor  Hypophosphatemia -Continue to monitor  Hypomagnesemia resolved -Continue to monitor  Prediabetes; A1c 6.3. Hyperglycemia.  -Accu-Cheks every 4 hours with sliding scale insulin PRN-increased to normal scale 5/7 -Goal BG 140-180 while admitted to the ICU.    History of tobacco abuse  - d/c'd  atrovent given apparent ileus am 5/8   Acute anemia- likely due to operative blood loss and volume resuscitation   Lab Results  Component Value Date   HGB 8.9 (L) 01/22/2020   HGB 9.9 (L) 01/20/2020   HGB 12.5 01/19/2020  >>>  threshold to tx at < 7 gm     Best practice:  Diet: NPO Pain/Anxiety/Delirium protocol (if indicated): yes VAP protocol (if indicated): yes DVT prophylaxis: SCDs and lovenox GI prophylaxis: pepcid Glucose control: SSI Mobility: bedrest Code Status: full Family Communication: Family at bedside am 5/8   Disposition: ICU  Labs   CBC: Recent Labs  Lab 01/18/20 1340 01/18/20 2040 01/19/20 0158 01/20/20 0500 01/22/20 0445  WBC 1.3* 2.3* 2.5* 7.6 9.3  NEUTROABS  --   --   --  7.0  --   HGB 12.7 13.7 12.5 9.9* 8.9*  HCT 40.4 42.3 38.1 29.7* 26.4*  MCV  90.4 86.0 84.5 84.1 82.8  PLT 210 243 217 142* 148*    Basic Metabolic Panel: Recent Labs  Lab 01/18/20 0525 01/19/20 0158 01/19/20 0159 01/19/20 0835 01/20/20 0500 01/21/20 0430 01/22/20 0445  NA  --  142  --  140 142 140 138  K  --  3.6  --  4.1 4.2 3.9 3.7  CL  --  115*  --  112* 113* 110 104  CO2  --  18*  --  20* 22 24 24   GLUCOSE  --  181*  --  162* 206* 127* 128*  BUN  --  18  --  16 15 16 17   CREATININE  --  0.96  --  1.03* 0.87 0.75 0.72  CALCIUM  --  7.3*  --  7.0* 7.4* 8.0* 8.0*  MG   < >  --  1.5* 2.1 2.2 2.0 1.7  PHOS  --  1.6*  --  4.3 2.4* 2.2* 4.3   < > = values in this interval not displayed.   GFR: Estimated Creatinine Clearance: 66.3 mL/min (by C-G formula based on SCr of 0.72 mg/dL). Recent Labs  Lab 01/18/20 1225 01/18/20 1340 01/18/20 1630 01/18/20 2040 01/19/20 0158 01/19/20 0835 01/19/20 1300 01/20/20 0500 01/22/20 0445  WBC  --    < >  --  2.3* 2.5*  --   --  7.6 9.3  LATICACIDVEN 7.3*  --  6.6*  --   --  2.8* 2.1*  --   --    < > = values in this interval not displayed.    Liver Function Tests: Recent Labs  Lab 01/18/20 1340 01/19/20 0158 01/20/20 0500 01/21/20 0430 01/22/20 0445  AST 28 28 33 54* 59*  ALT 12 13 13 16 19   ALKPHOS 26* 24* 31* 47 69  BILITOT 0.8 0.8 0.6 0.7 0.5  PROT 4.1* 3.9* 4.5* 5.0* 5.2*  ALBUMIN 2.8* 2.2* 2.2* 2.2* 1.9*   Recent Labs  Lab 01/18/20 0525  LIPASE 19   No results for input(s): AMMONIA in the last 168 hours.  ABG    Component Value Date/Time   PHART 7.433 01/21/2020 1300   PCO2ART 38.3 01/21/2020 1300   PO2ART 67.1 (L) 01/21/2020 1300   HCO3 25.1 01/21/2020 1300   TCO2 19 (L) 01/18/2020 1141   ACIDBASEDEF 3.6 (H) 01/20/2020 0729   O2SAT 92.3 01/21/2020 1300     Coagulation Profile: No results for input(s): INR, PROTIME in the last 168 hours.  Cardiac Enzymes: No results for input(s): CKTOTAL, CKMB, CKMBINDEX, TROPONINI in the last 168 hours.  HbA1C: Hgb A1c MFr Bld    Date/Time Value Ref Range Status  01/18/2020 05:25 AM 6.3 (H) 4.8 - 5.6 % Final    Comment:    (NOTE) Pre diabetes:          5.7%-6.4% Diabetes:              >6.4% Glycemic control for   <7.0% adults with diabetes     CBG: Recent Labs  Lab 01/21/20 1532 01/21/20 1928 01/21/20 2347 01/22/20 0348 01/22/20 0438  GLUCAP 97 108* 140* >600* 120*      The patient is critically ill with multiple organ systems failure and requires high complexity decision making for assessment and support, frequent evaluation and titration of therapies, application of advanced monitoring technologies and extensive interpretation of multiple databases. Critical Care Time devoted to patient care services described in this note is 36 minutes.     Christinia Gully, MD Pulmonary and [place] [phone] After 6:00 PM or weekends, use [facility] [phone]  After 7:00 pm call Elink  [phone]

## 2020-01-22 NOTE — Progress Notes (Signed)
4 Days Post-Op   Subjective/Chief Complaint: Awake and alert on vent. Reports some abd pain   Objective: Vital signs in last 24 hours: Temp:  [99 F (37.2 C)-100.1 F (37.8 C)] 100.1 F (37.8 C) (05/09 0344) Pulse Rate:  [84-109] 108 (05/09 0420) Resp:  [19-32] 23 (05/09 0600) BP: (97-141)/(59-125) 123/77 (05/09 0600) SpO2:  [96 %-100 %] 99 % (05/09 0600) FiO2 (%):  [40 %] 40 % (05/09 0600) Last BM Date: (UTA)  Intake/Output from previous day: 05/08 0701 - 05/09 0700 In: 2741.8 [I.V.:2339; IV Piggyback:402.8] Out: 3100 [Urine:3050; Emesis/NG output:50] Intake/Output this shift: No intake/output data recorded.  General appearance: alert and cooperative Resp: clear to auscultation bilaterally Cardio: regular rate and rhythm GI: soft, moderate tenderness with some distension. wound clean. ostomy producing a little  Lab Results:  Recent Labs    01/20/20 0500 01/22/20 0445  WBC 7.6 9.3  HGB 9.9* 8.9*  HCT 29.7* 26.4*  PLT 142* 148*   BMET Recent Labs    01/21/20 0430 01/22/20 0445  NA 140 138  K 3.9 3.7  CL 110 104  CO2 24 24  GLUCOSE 127* 128*  BUN 16 17  CREATININE 0.75 0.72  CALCIUM 8.0* 8.0*   PT/INR No results for input(s): LABPROT, INR in the last 72 hours. ABG Recent Labs    01/20/20 0729 01/21/20 1300  PHART 7.431 7.433  HCO3 19.6* 25.1    Studies/Results: DG Chest Port 1 View  Result Date: 01/21/2020 CLINICAL DATA:  Acute respiratory failure with hypoxemia. EXAM: PORTABLE CHEST 1 VIEW COMPARISON:  Chest radiograph 01/18/2019 FINDINGS: ET tube terminates 1.5 cm above the level of carina. A new left-sided PICC terminates within the right atrium. A right IJ approach central venous catheter terminates within the right atrium. An enteric tube passes below the level of the left hemidiaphragm, coils within the stomach and terminates within the mid to distal stomach. Heart size within normal limits. Interval increase in ill-defined opacity within the  mid to the lower left lung which may reflect pneumonia, edema and/or atelectasis. Minimal atelectasis within the right lung base. No evidence of pneumothorax. No acute bony abnormality. IMPRESSION: Support apparatus as described. Please note the enteric tube coils within the stomach. Interval increase in ill-defined opacity within the mid to lower left lung which may reflect pneumonia, edema and/or atelectasis. Minimal atelectasis within the right lung base. Electronically Signed   By: Jackey Loge DO   On: 01/21/2020 11:17    Anti-infectives: Anti-infectives (From admission, onward)   Start     Dose/Rate Route Frequency Ordered Stop   01/18/20 1400  piperacillin-tazobactam (ZOSYN) IVPB 3.375 g     3.375 g 12.5 mL/hr over 240 Minutes Intravenous Every 8 hours 01/18/20 1311     01/18/20 0715  piperacillin-tazobactam (ZOSYN) IVPB 3.375 g     3.375 g 100 mL/hr over 30 Minutes Intravenous  Once 01/18/20 0707 01/18/20 0837      Assessment/Plan: s/p Procedure(s): EXPLORATORY LAPAROTOMY SIGMOID RESECTION AND COLOSTOMY (N/A) Continue ng and bowel rest  Continue IV abx TPN for nutrition support Wean pressors and vent per CCM HTN Prediabetic- A1c6.3, SSI Tobacco abuse 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 Luisa Hart - POD#5 - surgical path pending -  BID wet to dry dressing changes to midline abdominal wound, may transition to vac in the future - WOC RN consult for new colostomy - expect prolonged ileus so we will go ahead and start TPN for nutrition, continue NPO/NGT to LIWS - continue IV zosyn, will discuss with MD if we need to start  antifungal - appreciate CCM assistance  ID -zosyn 5/5>>day#5 FEN -IVF, NPO/NGT to LIWS, start TPN VTE -SCDs, lovenox Foley -continue for strict I&O Follow up -TBD  LOS: 4 days    Darlene Castillo 01/22/2020

## 2020-01-22 NOTE — Progress Notes (Addendum)
PHARMACY - TOTAL PARENTERAL NUTRITION CONSULT NOTE   Indication: Prolonged ileus  Patient Measurements: Height: 5\' 1"  (154.9 cm) Weight: 67 kg (147 lb 11.3 oz) IBW/kg (Calculated) : 47.8   Body mass index is 27.91 kg/m. Usual Weight: 67 kg  Assessment: 59 yo F s/p Sigmoid colon perforation secondary to chronic constipation and ulceration with feculent peritonitis and gross contamination of abdominal cavity with stool balls  -S/p Exploratory laparotomy with partial colectomy and end colostomy 5/5 Dr. 46 5/6 PICC placed and started TPN for nutrition support; expect prolonged ileus  Glucose / Insulin: prediabetes, A1c 6.3, CBGs 89-140, serum glucose 128 (4 units SSI/24hr) Electrolytes: phos 4.3 s/p Naphos 68mM yesterday & max in TPN x 1 day, Mag 1.7 (goal >=2) ,  K 3.7 (goal >=4); CoCa 9.68 Renal: Scr WNL, 1950cc UOP over past 24hr LFTs / TGs: AST sl elevated at 59;, Trig 27> 331> 434>270, propofol dc'd 5/7 and no lipids in TPN Prealbumin / albumin: prealbumin 9.7 5/6 reflects inflammation of post-op stress and critical illness, admit alb 4.5 WNL, now 1.9 post-op, BMI 27.9, well-nourished PTA, poor PO intake for 5 days PTA Intake / Output; MIVF: LR at 15 ml/hr, fent at 125 mcg/hr; precedex at 0.7 mcg/kg/hr GI Imaging: Surgeries / Procedures:   -S/p Exploratory laparotomy with partial colectomy and end colostomy 5/5 Dr. 7/7 access: PICC 5/6 for TPN TPN start date: 01/19/2020  Nutritional Goals (per RD recommendation on 01/19/2020): kCal: 1584, Protein: 114-130, Fluid: >= 1.5 L/day  Goal TPN rate is 75 mL/hr (provides 117 g of protein and ~1570 kcals per day) without lipids Current Nutrition:  TPN at 60 ml/hr  Plan:  Mag 2 gm followed by 2 runs of KCL Increase TPN to goal rate of 75 ml/h at 1800 to provide 100% goals Hold lipid emulsion for first 7 days for critically ill patients per ASPEN guidelines (Start date 01/25/20) Electrolytes in TPN: 47mEq/L of Na, 58mEq/L  of K, 42mEq/L of Ca, 70mEq/L of Mg, and 71mmol/L of Phos . Cl:Ac ratio 1:1 Add standard MVI and trace elements to TPN Continue q4h SSI, adjusted to standard scale per CCM  MIVF DC LR at 1800 when TPN up to 75 ml/hr or per MD Continue pepcid 40 mg/day in TPN Monitor TPN labs on Mon/Thurs  12m, Pharm.D 01/22/2020 8:22 AM Pharmacy: 03/23/2020 01/22/2020 8:22 AM

## 2020-01-23 ENCOUNTER — Inpatient Hospital Stay (HOSPITAL_COMMUNITY): Payer: BLUE CROSS/BLUE SHIELD

## 2020-01-23 DIAGNOSIS — J9601 Acute respiratory failure with hypoxia: Secondary | ICD-10-CM

## 2020-01-23 DIAGNOSIS — K633 Ulcer of intestine: Secondary | ICD-10-CM

## 2020-01-23 DIAGNOSIS — F1120 Opioid dependence, uncomplicated: Secondary | ICD-10-CM

## 2020-01-23 DIAGNOSIS — G8929 Other chronic pain: Secondary | ICD-10-CM

## 2020-01-23 DIAGNOSIS — G9340 Encephalopathy, unspecified: Secondary | ICD-10-CM

## 2020-01-23 DIAGNOSIS — F1111 Opioid abuse, in remission: Secondary | ICD-10-CM

## 2020-01-23 DIAGNOSIS — K5909 Other constipation: Secondary | ICD-10-CM

## 2020-01-23 LAB — COMPREHENSIVE METABOLIC PANEL
ALT: 18 U/L (ref 0–44)
AST: 43 U/L — ABNORMAL HIGH (ref 15–41)
Albumin: 1.8 g/dL — ABNORMAL LOW (ref 3.5–5.0)
Alkaline Phosphatase: 81 U/L (ref 38–126)
Anion gap: 9 (ref 5–15)
BUN: 18 mg/dL (ref 6–20)
CO2: 24 mmol/L (ref 22–32)
Calcium: 8 mg/dL — ABNORMAL LOW (ref 8.9–10.3)
Chloride: 106 mmol/L (ref 98–111)
Creatinine, Ser: 0.64 mg/dL (ref 0.44–1.00)
GFR calc Af Amer: >60 mL/min (ref >60)
GFR calc non Af Amer: >60 mL/min (ref >60)
Glucose, Bld: 166 mg/dL — ABNORMAL HIGH (ref 70–99)
Potassium: 4.2 mmol/L (ref 3.5–5.1)
Sodium: 139 mmol/L (ref 135–145)
Total Bilirubin: 0.3 mg/dL (ref 0.3–1.2)
Total Protein: 4.9 g/dL — ABNORMAL LOW (ref 6.5–8.1)

## 2020-01-23 LAB — DIFFERENTIAL
Abs Immature Granulocytes: 0.23 10*3/uL — ABNORMAL HIGH (ref 0.00–0.07)
Basophils Absolute: 0 10*3/uL (ref 0.0–0.1)
Basophils Relative: 0 %
Eosinophils Absolute: 0.1 10*3/uL (ref 0.0–0.5)
Eosinophils Relative: 1 %
Immature Granulocytes: 2 %
Lymphocytes Relative: 8 %
Lymphs Abs: 0.9 10*3/uL (ref 0.7–4.0)
Monocytes Absolute: 0.9 10*3/uL (ref 0.1–1.0)
Monocytes Relative: 8 %
Neutro Abs: 9.1 10*3/uL — ABNORMAL HIGH (ref 1.7–7.7)
Neutrophils Relative %: 81 %

## 2020-01-23 LAB — CBC
HCT: 26.8 % — ABNORMAL LOW (ref 36.0–46.0)
Hemoglobin: 9 g/dL — ABNORMAL LOW (ref 12.0–15.0)
MCH: 27.6 pg (ref 26.0–34.0)
MCHC: 33.6 g/dL (ref 30.0–36.0)
MCV: 82.2 fL (ref 80.0–100.0)
Platelets: 181 10*3/uL (ref 150–400)
RBC: 3.26 MIL/uL — ABNORMAL LOW (ref 3.87–5.11)
RDW: 13.4 % (ref 11.5–15.5)
WBC: 11.3 10*3/uL — ABNORMAL HIGH (ref 4.0–10.5)
nRBC: 0.2 % (ref 0.0–0.2)

## 2020-01-23 LAB — GLUCOSE, CAPILLARY
Glucose-Capillary: 133 mg/dL — ABNORMAL HIGH (ref 70–99)
Glucose-Capillary: 153 mg/dL — ABNORMAL HIGH (ref 70–99)
Glucose-Capillary: 162 mg/dL — ABNORMAL HIGH (ref 70–99)
Glucose-Capillary: 168 mg/dL — ABNORMAL HIGH (ref 70–99)
Glucose-Capillary: 168 mg/dL — ABNORMAL HIGH (ref 70–99)
Glucose-Capillary: 173 mg/dL — ABNORMAL HIGH (ref 70–99)
Glucose-Capillary: 187 mg/dL — ABNORMAL HIGH (ref 70–99)
Glucose-Capillary: 218 mg/dL — ABNORMAL HIGH (ref 70–99)

## 2020-01-23 LAB — MAGNESIUM: Magnesium: 1.8 mg/dL (ref 1.7–2.4)

## 2020-01-23 LAB — PROCALCITONIN: Procalcitonin: 14.12 ng/mL

## 2020-01-23 LAB — PREALBUMIN: Prealbumin: <5 mg/dL — ABNORMAL LOW (ref 18–38)

## 2020-01-23 LAB — PHOSPHORUS: Phosphorus: 4.4 mg/dL (ref 2.5–4.6)

## 2020-01-23 LAB — TRIGLYCERIDES: Triglycerides: 210 mg/dL — ABNORMAL HIGH (ref <150)

## 2020-01-23 MED ORDER — FENTANYL CITRATE (PF) 100 MCG/2ML IJ SOLN
50.0000 ug | INTRAMUSCULAR | Status: DC | PRN
  Administered 2020-01-23: 100 ug via INTRAVENOUS
  Filled 2020-01-23: qty 4

## 2020-01-23 MED ORDER — VITAL HIGH PROTEIN PO LIQD
1000.0000 mL | ORAL | Status: DC
  Administered 2020-01-23 – 2020-01-24 (×2): 1000 mL

## 2020-01-23 MED ORDER — MIDAZOLAM HCL 2 MG/2ML IJ SOLN
1.0000 mg | INTRAMUSCULAR | Status: DC | PRN
  Administered 2020-01-23 (×3): 2 mg via INTRAVENOUS
  Administered 2020-01-24: 4 mg via INTRAVENOUS
  Filled 2020-01-23 (×2): qty 2
  Filled 2020-01-23: qty 4
  Filled 2020-01-23: qty 2

## 2020-01-23 MED ORDER — MIDAZOLAM HCL 2 MG/2ML IJ SOLN
2.0000 mg | Freq: Once | INTRAMUSCULAR | Status: AC
  Administered 2020-01-23: 2 mg via INTRAVENOUS
  Filled 2020-01-23: qty 2

## 2020-01-23 MED ORDER — TRAVASOL 10 % IV SOLN
INTRAVENOUS | Status: AC
  Filled 2020-01-23: qty 1170

## 2020-01-23 MED ORDER — INSULIN ASPART 100 UNIT/ML ~~LOC~~ SOLN
3.0000 [IU] | SUBCUTANEOUS | Status: DC
  Administered 2020-01-23: 9 [IU] via SUBCUTANEOUS
  Administered 2020-01-23 (×3): 6 [IU] via SUBCUTANEOUS
  Administered 2020-01-24: 9 [IU] via SUBCUTANEOUS
  Administered 2020-01-24: 3 [IU] via SUBCUTANEOUS
  Administered 2020-01-24 (×2): 9 [IU] via SUBCUTANEOUS
  Administered 2020-01-24: 6 [IU] via SUBCUTANEOUS
  Administered 2020-01-25: 3 [IU] via SUBCUTANEOUS
  Administered 2020-01-25 (×2): 6 [IU] via SUBCUTANEOUS

## 2020-01-23 MED ORDER — ACETAMINOPHEN 160 MG/5ML PO SOLN
650.0000 mg | Freq: Four times a day (QID) | ORAL | Status: DC | PRN
  Administered 2020-01-23 – 2020-02-02 (×11): 650 mg
  Filled 2020-01-23 (×12): qty 20.3

## 2020-01-23 MED ORDER — LACTATED RINGERS IV BOLUS
500.0000 mL | Freq: Once | INTRAVENOUS | Status: AC
  Administered 2020-01-23: 500 mL via INTRAVENOUS

## 2020-01-23 MED ORDER — MAGNESIUM SULFATE 2 GM/50ML IV SOLN
2.0000 g | Freq: Once | INTRAVENOUS | Status: AC
  Administered 2020-01-23: 2 g via INTRAVENOUS
  Filled 2020-01-23: qty 50

## 2020-01-23 NOTE — Progress Notes (Signed)
elink notified because patient started to have a panic attack during bath despite multiple fentanyl bolus's and increase in precedex and fentanyl infusions.  Pt HR, RR and BP high. One time dose of versed ordered. Will administer and continue to monitor.

## 2020-01-23 NOTE — Progress Notes (Signed)
Pt. Had been off of fentanyl drip since 1215 today. RN gave 1 dose of versed during the day for anxiety and it subsided. At 1800 pt. Become uncontrollable with anxiety the vent kept alarming and pt. Was attempting to pull the tube out. RN gave 4 mg versed prn and 4 mg fentanyl prn pt. Still remained anxious. RN spoke with CCM and restarted her fentanyl drip. Pt. Shakes her head to pain. I think most of this is driven from anxiety. This tends to be much better after her methadone.

## 2020-01-23 NOTE — TOC Progression Note (Signed)
Transition of Care [facility]) - Progression Note    Patient Details  Name: RHEALYN CULLEN MRN: [id number] Date of Birth: [birth year]  Transition of Care [facility]) CM/SW Contact  Golda Acre, RN Phone Number: 01/23/2020, 8:19 AM  Clinical Narrative:    Peritonitis 2/2 sigmoid colon perforation s/p ex-lap 5/5 with resection and end colostomy Remains Vent dependant at 50% fi02, Iv sedation, Iv abx iv tpn,   Expected Discharge Plan: Home/Self Care Barriers to Discharge: Continued Medical Work up  Expected Discharge Plan and Services Expected Discharge Plan: Home/Self Care       Living arrangements for the past 2 months: Single Family Home                                       Social Determinants of Health (SDOH) Interventions    Readmission Risk Interventions No flowsheet data found.

## 2020-01-23 NOTE — Progress Notes (Addendum)
NAME:  Darlene Castillo, MRN:  [id number], DOB:  1960/09/17, LOS: 5 ADMISSION DATE:  01/18/2020, CONSULTATION DATE:  01/18/20 REFERRING MD:  Harriette Bouillon CHIEF COMPLAINT:  VDRF, septic shock  Brief History   Peritonitis 2/2 sigmoid colon perforation s/p ex-lap 5/5 with resection and end colostomy.  History of present illness   Darlene Castillo is a 18 yobf smoker  who presented early am 5/5  with abdominal pain, constipation, one episode of vomiting and was found to have a perforated sigmoid colon and peritonitis.  She went to the OR for ex lap and return to the ICU intubated.  Past Medical History  Chronic opiate use Tobacco abuse DM HTN  Significant Hospital Events   5/5 ex lap with partial colectomy and end colostomy for perf:  neg path    Consults:  CCS PCCM  Procedures:  5/5 CVL RIJ 5/5 A-line 5/7  5/5 ET 5/6 PIC L brachial   Significant Diagnostic Tests:  CT abdomen pelvis with contrast 01/17/2020-scattered groundglass opacities in the lower lungs bilaterally, no focal opacities.  Abdominal free air.  Multiple dilated loops of bowel with stool.  Large R renal cyst. Path 5/5 >>> - Perforation with acute inflammation and acute serositis. No evidence of malignancy.    Micro Data:  Covid negative  Antimicrobials:  Zosyn 5/5  >>  Interim history/subjective:  Remains on fentanyl and precedex.  Panic attack this AM requiring versed.  Objective   Blood pressure (!) 165/97, pulse (!) 128, temperature 98.4 F (36.9 C), temperature source Axillary, resp. rate (!) 34, height 5\' 1"  (1.549 m), weight 67 kg, last menstrual period 05/23/2013, SpO2 99 %.    Vent Mode: PRVC FiO2 (%):  [40 %-50 %] 40 % Set Rate:  [26 bmp] 26 bmp Vt Set:  [330 mL] 330 mL PEEP:  [5 cmH20] 5 cmH20 Plateau Pressure:  [15 cmH20-21 cmH20] 15 cmH20   Intake/Output Summary (Last 24 hours) at 01/23/2020 0750 Last data filed at 01/23/2020 0600 Gross per 24 hour  Intake 2849.12 ml  Output 3335 ml  Net -485.88 ml    Filed Weights   01/18/20 1300  Weight: 67 kg       Examination: General: Adult female, resting in bed, in NAD. Neuro: Sedated, follows commands. HEENT: [place]/AT. Sclerae anicteric. ETT in place. Cardiovascular: RRR, no M/R/G.  Lungs: Respirations even and unlabored.  CTA bilaterally, No W/R/R.  Abdomen: Colostomy with small liquid brown mucus output.  BS x 4, soft, NT/ND.  Musculoskeletal: No gross deformities, no edema.  Skin: Intact, warm, no rashes.   Assessment & Plan:   Acute hypoxic respiratory failure requiring mechanical ventilation. Possible VAP. - SBT this AM. - Hopeful for extubation but pain control will remain an issue (she has chronic opiate use prior to admission). - Continue empiric zosyn. - Bronchial hygiene. - Follow CXR.  Septic shock due to peritonitis 2/2 sigmoid colon perforation from chronic constipation - s/p ex lap with sigmoid resection and colostomy. - Per CCS. - Continue empiric zosyn.  Malnutrition. - Continue TPN.  Chronic methadone use - Continue methadone 7.5 mg Q6h as needed.  - Wean fentanyl / precedex as able.   Rest per primary team.   Best practice:  Diet: NPO.  TPN for now. Pain/Anxiety/Delirium protocol (if indicated): Fentanyl gtt / Precedex gtt, wean to off.  RASS goal 0. VAP protocol (if indicated): In place. DVT prophylaxis: SCDs and lovenox. GI prophylaxis: pepcid. Glucose control: SSI. Mobility: bedrest. Code Status: full. Family Communication:  Per primary. Disposition: ICU.   Darlene Castillo, [place] [facility] 01/23/2020, 7:57 AM

## 2020-01-23 NOTE — Progress Notes (Addendum)
Nutrition Follow up  DOCUMENTATION CODES:   Not applicable  INTERVENTION:   TPN to meet 100% of needs until enteral tolerance is established.   Tube feeding:  -Vital High Protein @ 20 ml/hr via NGT -Increase per toleration to goal rate of 60 ml/hr (1440 ml)  At goal TF provides: 1440 kcals, 126 grams protein, 1204 ml free water.   NUTRITION DIAGNOSIS:   Inadequate oral intake related to inability to eat as evidenced by NPO status.  Ongoing  GOAL:   Provide needs based on ASPEN/SCCM guidelines  Addressed via TPN, EN  MONITOR:   Weight trends, Labs, Vent status, I & O's, Skin, Diet advancement  REASON FOR ASSESSMENT:   Consult, Ventilator New TPN/TNA  ASSESSMENT:   59 year old female with past medical history of tobacco abuse, prior history of heroin use on methadone for over the past 15 years, diet controlled DM presented with acute worsening abdominal pain, constipation, nausea with multiple episodes of emesis and poor po intake over the last 5 days. CT revealed colonic perforation in sigmoid/rectosigmoid.  5/5 - ex-lap with partial colectomy and end colostomy 5/6- TPN start  Pain control/anxiety likely precludes extubation. Off pressors. Awaiting bowel return. Receiving TPN @ 75 ml/hr to provide 1570 kcal and 117 grams protein. Meets 100% of nutrition requirement. Needs adjusted for current vent setting/temp. Okay to start trickle feeds per CCS.   Admission weight: 67 kg Current weight not obtained   Patient remains intubated on ventilator support MV: 8.3 L/min Temp (24hrs), Avg:98.7 F (37.1 C), Min:98.3 F (36.8 C), Max:99.1 F (37.3 C)   I/O: +6,861 ml since admit  UOP: 3,310 ml x 24 hrs   Drips: precedex  Medications: SS novolog Labs: CBG 127-173  Diet Order:   Diet Order            Diet NPO time specified  Diet effective now              EDUCATION NEEDS:   Not appropriate for education at this time  Skin:  Skin Assessment: Skin  Integrity Issues: Skin Integrity Issues:: Incisions Incisions: closed abdomen  Last BM:  PTA  Height:   Ht Readings from Last 1 Encounters:  01/21/20 5\' 1"  (1.549 m)    Weight:   Wt Readings from Last 1 Encounters:  01/18/20 67 kg    BMI:  Body mass index is 27.91 kg/m.  Estimated Nutritional Needs:   Kcal:  1379 kcal  Protein:  114-130  Fluid:  >/= 1.3 L/day  03/19/20 RD, LDN Clinical Nutrition Pager listed in AMION

## 2020-01-23 NOTE — Consult Note (Addendum)
WOC Nurse ostomy follow up Pt is intubated and not awake, no family members at the bedside.  Stoma type/location:  Stoma is red and viable, above skin level, 1 3/4 inches and oval Peristomal assessment: intact skin surrounding Inserted lubricated finger into stoma and there is no stool or flatus Ostomy pouching: 2pc.  Enrolled patient in [facility] Discharge program: Not yet Applied barrier ring and 2 piece pouch.  Extra supplies left at the bedside. WOC team will begin teaching sessions when stable and out of ICU.   Post-note 0930: Ostomy irrigation performed per surgical team request.  Inserted irrigation cone into colostomy stoma and instilled 500 cc water; obtained 300cc back with small chunks of brown formed stool.    Pt medicated for pain prior to the procedure and applied one piece black foam to cont suction. Full thickness post-op midline wound is beefy red with yellow adipose tissue in layers.[id number]. Applied barrier ring at lower edge of abd skin fold to attempt to maintain a seal.  Pt tolerated with mod amt discomfort.  WOC will plan to change Q M/W/F. Cammie Mcgee MSN, RN, CWOCN, [place], CNS [phone]

## 2020-01-23 NOTE — Progress Notes (Addendum)
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 01/18/2020 Active Problems:   Methadone dependence (HCC)   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   Pneumoperitoneum   Postoperative acute respiratory failure (HCC)   Endotracheally intubated   Acute respiratory failure with hypoxemia (HCC)   Encephalopathy acute   Other chronic pain   Constipation, chronic   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   5 Days Post-Op  01/18/2020  Preoperative diagnosis: Sigmoid colon perforation secondary to chronic constipation and ulceration with feculent peritonitis and Darlene Castillo of abdominal cavity with stool balls septic shock  Postoperative diagnosis: Same with significant fecal Castillo class IV wound with free  stool balls floating throughout the abdominal cavity causing severe widespread peritonitis with septic shock  Procedure: Exploratory laparotomy with partial colectomy and end colostomy  Surgeon: Darlene Bouillon, MD  Assistant: Darlene Castillo  FINAL MICROSCOPIC DIAGNOSIS:    COLON, PERFORATED SIGMOID, RESECTION:   - Perforation with acute inflammation and acute serositis.  - No evidence of malignancy.    Assessment/Plan: s/p Procedure(s): EXPLORATORY LAPAROTOMY SIGMOID RESECTION AND COLOSTOMY (N/A)  Continue ng and bowel rest  Continue IV abx TPN for  nutrition support Wean pressors and vent per CCM HTN Prediabetic- A1c6.3, SSI Tobacco abuse Prior h/o heroin abuse at least 15 years ago, on methadone Chronic constipation Malnutrition - prealbumin 9.7 (5/5), starting TPN VDRF  Septic shock Sigmoid colon perforation secondary to chronic constipation and ulceration with feculent peritonitis and Darlene Castillo Castillo of abdominal cavity with stool balls -S/pExploratory laparotomy with partial colectomy and end colostomy5/5 Dr. Luisa Castillo - - surgical path benign - switch to wound vac q MWF - d/w ICU & WOCN nurses - WOC RN consult for new colostomy - seen today - try irrigation via colostomy - expect prolonged ileus so continue TPN for nutrition - try trophic tube feeds if fails, go back to NGT to LIWS - continue IV zosyn x 10 days minimum.   - appreciate CCM assistance  ID -zosyn 5/5>>10 day course FEN -IVF, NPO/NGT to LIWS, start TPN VTE -SCDs, lovenox Foley -continue for strict I&O Follow up -TBD  [facility] Stay = 5 days)  45 minutes spent in review, evaluation, examination, counseling, and coordination of care.  More than 50% of that time was spent in counseling.  I updated the patient's status to the nurse.  Recommendations were made.  Questions were answered.  She expressed understanding & appreciation.   01/23/2020    Subjective: (Chief complaint)  Weaning on ventilator.  Passing gas but no stool.  Seen by ostomy nurses   Objective:  Vital signs:  Vitals:   01/23/20 0637 01/23/20 0646 01/23/20 0700 01/23/20 0800  BP: (!) 202/117 (!) 144/79 (!) 165/97 (!) 88/32  Pulse: (!) 119 (!) 118 (!) 128 99  Resp: (!) 33 (!) 33 (!) 34 (!) 23  Temp:      TempSrc:      SpO2: 93% 96% 99% 99%  Weight:      Height:        Last BM Date: (PTA)  Intake/Output   Yesterday:  05/09 0701 - 05/10 0700 In: 2849.1 [I.V.:2590.5; IV Piggyback:258.6] Out: 3335 [Urine:3310; Stool:25] This shift:  Total I/O In: 279.1  [I.V.:254; IV Piggyback:25] Out: -   Bowel function:  Flatus: YES  BM:  No  Drain: (No drain)   Physical Exam:  General: Pt intubated but awakens to ostomy exam.  Less so to voice.  Following commands.Eyes: PERRL, normal EOM.  Sclera clear.  No icterus Neuro: CN II-XII intact w/o focal sensory/motor deficits. Lymph: No head/neck/groin lymphadenopathy Psych:  No delerium/psychosis/paranoia.   HENT: Normocephalic, Mucus membranes moist.  No thrush Neck: Supple, No tracheal deviation.  No obvious thyromegaly Chest: No pain to chest wall compression.  Good respiratory excursion.  No audible wheezing CV:  Pulses intact.  Regular rhythm.  No major extremity edema MS: Normal AROM mjr joints.  No obvious deformity  Abdomen: Soft.  Moderately distended.  Mildly tender at incisions only.  Colostomy pink and edematous.  Moderate gas but no stool.  Ostomy intubated with large volume of soft stool about 8 cm more proximally.  Midline wound clean with minimal granulation tissue.  No dehiscence.  No evidence of peritonitis.  No incarcerated hernias.  Ext:   No deformity.  2+ edema.  No cyanosis Skin: No petechiae / purpurea.  No major sores.  Warm and dry    Results:   SURGICAL PATHOLOGY  CASE: [id number]  PATIENT: Darlene Castillo  Surgical Pathology Report      Clinical History: Pneumoperitoneum (crm)      FINAL MICROSCOPIC DIAGNOSIS:   A. COLON, PERFORATED SIGMOID, RESECTION:  - Perforation with acute inflammation and acute serositis.  - No evidence of malignancy.    Darlene Castillo DESCRIPTION:  The specimen is received fresh labeled perforated sigmoid colon, and  consists of 2 unoriented portions of bowel. The first segment displays  1 open and 1 stapled margin, and measures 3.5 cm in length. The second  segment displays 2 stapled resection margins, measures 7.0 cm in length.  The serosal surfaces are tan-red to green, focally dusky, with attached  adipose tissue. Identified on  the larger segment of bowel is a 2.0 cm  full-thickness defect, measuring 1.0 cm from the closest margin. The  surrounding tissue is tan-green, dull, and softened. Lumen is filled  with a small amount of tan-yellow fecal material. Mucosa on each  segment of bowel is tan-pink with normal folding, and no mucosal lesions  are grossly identified. Wall ranges from 0.2 to 0.4 cm in thickness.  No enlarged lymph nodes are grossly identified. Representative sections  are submitted in 9 cassettes.  1 and 2 = stapled margin from the shorter segment of bowel  3 = representative mucosa  4 and 5, 6 and 7 = opposing stapled margins from longer segment of bowel  8 and 9 = sections of perforation Lovey Newcomer 01/19/2020)   Final Diagnosis performed by Jimmy Picket, MD.  Electronically signed  01/19/2020  Technical and / or Professional components performed at [facility], [address], [place], [place] [address].  Immunohistochemistry Technical component (if applicable) was performed  at [facility]. [address],  [place], [place] [address].  IMMUNOHISTOCHEMISTRY DISCLAIMER (if applicable):  Some of these immunohistochemical stains may have been developed and the  performance characteristics determine by [facility]. Some  may not have been cleared or  approved by the U.S. Food and Drug  Administration. The FDA has determined that such clearance or approval  is not necessary. This test is used for clinical purposes. It should not  be regarded as investigational or for research. This laboratory is  certified under the Clinical Laboratory Improvement Amendments of 1988  (CLIA-88) as qualified to perform high complexity clinical laboratory  testing. The controls stained appropriately.    Cultures: Recent Results (from the past 720 hour(s))  Respiratory Panel by RT PCR (Flu A&B, Covid) - Nasopharyngeal Swab     Status: None   Collection  Time: 01/18/20  8:12 AM   Specimen: Nasopharyngeal Swab  Result Value Ref Range Status   SARS Coronavirus 2 by RT PCR NEGATIVE NEGATIVE Final    Comment: (NOTE) SARS-CoV-2 target nucleic acids are NOT DETECTED. The SARS-CoV-2 RNA is generally detectable in upper respiratoy specimens during the acute phase of infection. The lowest concentration of SARS-CoV-2 viral copies this assay can detect is 131 copies/mL. A negative result does not preclude SARS-Cov-2 infection and should not be used as the sole basis for treatment or other patient management decisions. A negative result may occur with  improper specimen collection/handling, submission of specimen other than nasopharyngeal swab, presence of viral mutation(s) within the areas targeted by this assay, and inadequate number of viral copies (<131 copies/mL). A negative result must be combined with clinical observations, patient history, and epidemiological information. The expected result is Negative. Fact Sheet for Patients:  https://www.moore.com/ Fact Sheet for Healthcare Providers:  https://www.young.biz/ This test is not yet ap proved or cleared by the [place] FDA and  has been authorized for detection and/or diagnosis of SARS-CoV-2 by FDA under an Emergency Use Authorization (EUA). This EUA will remain  in effect (meaning this test can be used) for the duration of the COVID-19 declaration under Section 564(b)(1) of the Act, 21 U.S.C. section 360bbb-3(b)(1), unless the authorization is terminated or revoked sooner.    Influenza A by PCR NEGATIVE NEGATIVE Final   Influenza B by PCR NEGATIVE NEGATIVE Final    Comment: (NOTE) The Xpert Xpress SARS-CoV-2/FLU/RSV assay is intended as an aid in  the diagnosis of influenza from Nasopharyngeal swab specimens and  should not be used as a sole basis for treatment. Nasal washings and  aspirates are unacceptable for Xpert Xpress  SARS-CoV-2/FLU/RSV  testing. Fact Sheet for Patients: https://www.moore.com/ Fact Sheet for Healthcare Providers: https://www.young.biz/ This test is not yet approved or cleared by the [place] FDA and  has been authorized for detection and/or diagnosis of SARS-CoV-2 by  FDA under an Emergency Use Authorization (EUA). This EUA will remain  in effect (meaning this test can be used) for the duration of the  Covid-19 declaration under Section 564(b)(1) of the Act, 21  U.S.C. section 360bbb-3(b)(1), unless the authorization is  terminated or revoked. Performed at [facility], [address], [place], [place] [address]   MRSA PCR Screening     Status: None   Collection Time: 01/18/20  9:13 PM   Specimen: Nasal Mucosa; Nasopharyngeal  Result Value Ref Range Status   MRSA by PCR NEGATIVE NEGATIVE Final    Comment:        The GeneXpert MRSA Assay (FDA approved for NASAL specimens only), is one component of a comprehensive MRSA colonization surveillance program. It is not intended to diagnose MRSA infection nor to guide or monitor treatment for MRSA infections. Performed at [facility], [address] [place], [place]  [address]     Labs: Results for orders placed or performed during the hospital encounter of 01/18/20 (from the past 48 hour(s))  Glucose, capillary     Status: Abnormal   Collection Time: 01/21/20 11:31 AM  Result Value Ref Range   Glucose-Capillary 126 (H) 70 - 99 mg/dL    Comment: Glucose reference range applies only to samples taken after fasting for at least 8 hours.  Blood gas, arterial     Status: Abnormal   Collection Time: 01/21/20  1:00 PM  Result Value Ref Range   FIO2 40.00    Delivery systems VENTILATOR    Mode PRESSURE REGULATED VOLUME CONTROL    VT 330 mL   LHR 26 resp/min   pH, Arterial 7.433 7.350 - 7.450   pCO2 arterial 38.3 32.0 - 48.0 mmHg   pO2, Arterial  67.1 (L) 83.0 - 108.0 mmHg   Bicarbonate 25.1 20.0 - 28.0 mmol/L   Acid-Base Excess 1.5 0.0 - 2.0 mmol/L   O2 Saturation 92.3 %   Patient temperature 98.6    Collection site RIGHT RADIAL    Drawn by [id number]    Sample type ARTERIAL    Allens test (Boettger/fail) Hulick Hieronymus    Comment: Performed at [facility], [place] [address], [place], [place] [address]  Glucose, capillary     Status: None   Collection Time: 01/21/20  3:32 PM  Result Value Ref Range   Glucose-Capillary 97 70 - 99 mg/dL    Comment: Glucose reference range applies only to samples taken after fasting for at least 8 hours.  Glucose, capillary     Status: Abnormal   Collection Time: 01/21/20  7:28 PM  Result Value Ref Range   Glucose-Capillary 108 (H) 70 - 99 mg/dL    Comment: Glucose reference range applies only to samples taken after fasting for at least 8 hours.   Comment 1 Notify RN    Comment 2 Document in Chart   Glucose, capillary     Status: Abnormal   Collection Time: 01/21/20 11:47 PM  Result Value Ref Range   Glucose-Capillary 140 (H) 70 - 99 mg/dL    Comment: Glucose reference range applies only to samples taken after fasting for at least 8 hours.   Comment 1 Notify RN    Comment 2 Document in Chart   Glucose, capillary     Status: Abnormal   Collection Time: 01/22/20  3:48 AM  Result Value Ref Range   Glucose-Capillary >600 (HH) 70 - 99 mg/dL    Comment: Glucose reference range applies only to samples taken after fasting for at least 8 hours.  Glucose, capillary     Status: Abnormal   Collection Time: 01/22/20  4:38 AM  Result Value Ref Range   Glucose-Capillary 120 (H) 70 - 99 mg/dL    Comment: Glucose reference range applies only to samples taken after fasting for at least 8 hours.  Triglycerides     Status: Abnormal   Collection Time: 01/22/20  4:45 AM  Result Value Ref Range   Triglycerides 270 (H) <150 mg/dL    Comment: Performed at [facility], [place] [address], [place], [place] [address]  Comprehensive metabolic panel     Status: Abnormal   Collection Time: 01/22/20  4:45 AM  Result Value Ref Range   Sodium 138 135 - 145 mmol/L   Potassium 3.7 3.5 - 5.1 mmol/L   Chloride 104 98 - 111 mmol/L   CO2 24 22 -  32 mmol/L   Glucose, Bld 128 (H) 70 - 99 mg/dL    Comment: Glucose reference range applies only to samples taken after fasting for at least 8 hours.   BUN 17 6 - 20 mg/dL   Creatinine, Ser 4.54 0.44 - 1.00 mg/dL   Calcium 8.0 (L) 8.9 - 10.3 mg/dL   Total Protein 5.2 (L) 6.5 - 8.1 g/dL   Albumin 1.9 (L) 3.5 - 5.0 g/dL   AST 59 (H) 15 - 41 U/L   ALT 19 0 - 44 U/L   Alkaline Phosphatase 69 38 - 126 U/L   Total Bilirubin 0.5 0.3 - 1.2 mg/dL   GFR calc non Af Amer >60 >60 mL/min   GFR calc Af Amer >60 >60 mL/min   Anion gap 10 5 - 15    Comment: Performed at [facility], [address] [place], [place] [address]  Magnesium     Status: None   Collection Time: 01/22/20  4:45 AM  Result Value Ref Range   Magnesium 1.7 1.7 - 2.4 mg/dL    Comment: Performed at [facility], [address] [place], [place] [address]  Phosphorus     Status: None   Collection Time: 01/22/20  4:45 AM  Result Value Ref Range   Phosphorus 4.3 2.5 - 4.6 mg/dL    Comment: Performed at [facility], [address] [place], [place] [address]  CBC     Status: Abnormal   Collection Time: 01/22/20  4:45 AM  Result Value Ref Range   WBC 9.3 4.0 - 10.5 K/uL   RBC 3.19 (L) 3.87 - 5.11 MIL/uL   Hemoglobin 8.9 (L) 12.0 - 15.0 g/dL   HCT 13.0 (L) 86.5 - 78.4 %   MCV 82.8 80.0 - 100.0 fL   MCH 27.9 26.0 - 34.0 pg   MCHC 33.7 30.0 - 36.0 g/dL   RDW 69.6 29.5 - 28.4 %   Platelets 148 (L) 150 - 400 K/uL   nRBC 0.2 0.0 - 0.2 %    Comment: Performed at [facility], [address] [place], [place] [address]  Glucose, capillary     Status: Abnormal   Collection Time: 01/22/20 11:24 AM  Result  Value Ref Range   Glucose-Capillary 179 (H) 70 - 99 mg/dL    Comment: Glucose reference range applies only to samples taken after fasting for at least 8 hours.  Glucose, capillary     Status: Abnormal   Collection Time: 01/22/20  3:43 PM  Result Value Ref Range   Glucose-Capillary 139 (H) 70 - 99 mg/dL    Comment: Glucose reference range applies only to samples taken after fasting for at least 8 hours.  Glucose, capillary     Status: Abnormal   Collection Time: 01/22/20  7:55 PM  Result Value Ref Range   Glucose-Capillary 127 (H) 70 - 99 mg/dL    Comment: Glucose reference range applies only to samples taken after fasting for at least 8 hours.  Glucose, capillary     Status: Abnormal   Collection Time: 01/23/20 12:19 AM  Result Value Ref Range   Glucose-Capillary 173 (H) 70 - 99 mg/dL    Comment: Glucose reference range applies only to samples taken after fasting for at least 8 hours.  Glucose, capillary     Status: Abnormal   Collection Time: 01/23/20  3:20 AM  Result Value Ref Range   Glucose-Capillary 153 (H) 70 - 99 mg/dL  Comment: Glucose reference range applies only to samples taken after fasting for at least 8 hours.  Comprehensive metabolic panel     Status: Abnormal   Collection Time: 01/23/20  3:45 AM  Result Value Ref Range   Sodium 139 135 - 145 mmol/L   Potassium 4.2 3.5 - 5.1 mmol/L   Chloride 106 98 - 111 mmol/L   CO2 24 22 - 32 mmol/L   Glucose, Bld 166 (H) 70 - 99 mg/dL    Comment: Glucose reference range applies only to samples taken after fasting for at least 8 hours.   BUN 18 6 - 20 mg/dL   Creatinine, Ser 7.82 0.44 - 1.00 mg/dL   Calcium 8.0 (L) 8.9 - 10.3 mg/dL   Total Protein 4.9 (L) 6.5 - 8.1 g/dL   Albumin 1.8 (L) 3.5 - 5.0 g/dL   AST 43 (H) 15 - 41 U/L   ALT 18 0 - 44 U/L   Alkaline Phosphatase 81 38 - 126 U/L   Total Bilirubin 0.3 0.3 - 1.2 mg/dL   GFR calc non Af Amer >60 >60 mL/min   GFR calc Af Amer >60 >60 mL/min   Anion gap 9 5 - 15     Comment: Performed at [facility], [address], [place], [place] [address]  Magnesium     Status: None   Collection Time: 01/23/20  3:45 AM  Result Value Ref Range   Magnesium 1.8 1.7 - 2.4 mg/dL    Comment: Performed at [facility], [address], [place], [place] [address]  Phosphorus     Status: None   Collection Time: 01/23/20  3:45 AM  Result Value Ref Range   Phosphorus 4.4 2.5 - 4.6 mg/dL    Comment: Performed at [facility], [address] [place], [place] [address]  CBC     Status: Abnormal   Collection Time: 01/23/20  3:45 AM  Result Value Ref Range   WBC 11.3 (H) 4.0 - 10.5 K/uL   RBC 3.26 (L) 3.87 - 5.11 MIL/uL   Hemoglobin 9.0 (L) 12.0 - 15.0 g/dL   HCT 62.9 (L) 52.8 - 41.3 %   MCV 82.2 80.0 - 100.0 fL   MCH 27.6 26.0 - 34.0 pg   MCHC 33.6 30.0 - 36.0 g/dL   RDW 24.4 01.0 - 27.2 %   Platelets 181 150 - 400 K/uL   nRBC 0.2 0.0 - 0.2 %    Comment: Performed at [facility], [address] [place], [place] [address]  Differential     Status: Abnormal   Collection Time: 01/23/20  3:45 AM  Result Value Ref Range   Neutrophils Relative % 81 %   Neutro Abs 9.1 (H) 1.7 - 7.7 K/uL   Lymphocytes Relative 8 %   Lymphs Abs 0.9 0.7 - 4.0 K/uL   Monocytes Relative 8 %   Monocytes Absolute 0.9 0.1 - 1.0 K/uL   Eosinophils Relative 1 %   Eosinophils Absolute 0.1 0.0 - 0.5 K/uL   Basophils Relative 0 %   Basophils Absolute 0.0 0.0 - 0.1 K/uL   Immature Granulocytes 2 %   Abs Immature Granulocytes 0.23 (H) 0.00 - 0.07 K/uL    Comment: Performed at [facility], [address], [place], [place] [address]  Triglycerides     Status: Abnormal   Collection Time: 01/23/20  3:45 AM  Result Value Ref Range   Triglycerides 210 (H) <150 mg/dL  Comment: Performed at [facility], [address] [place], [place] [address]  Prealbumin     Status: Abnormal    Collection Time: 01/23/20  3:45 AM  Result Value Ref Range   Prealbumin <5 (L) 18 - 38 mg/dL    Comment: Performed at [facility], [address], [place], [place] [address]  Procalcitonin - Baseline     Status: None   Collection Time: 01/23/20  3:45 AM  Result Value Ref Range   Procalcitonin 14.12 ng/mL    Comment:        Interpretation: PCT >= 10 ng/mL: Important systemic inflammatory response, almost exclusively due to severe bacterial sepsis or septic shock. (NOTE)       Sepsis PCT Algorithm           Lower Respiratory Tract                                      Infection PCT Algorithm    ----------------------------     ----------------------------         PCT < 0.25 ng/mL                PCT < 0.10 ng/mL         Strongly encourage             Strongly discourage   discontinuation of antibiotics    initiation of antibiotics    ----------------------------     -----------------------------       PCT 0.25 - 0.50 ng/mL            PCT 0.10 - 0.25 ng/mL               OR       >80% decrease in PCT            Discourage initiation of                                            antibiotics      Encourage discontinuation           of antibiotics    ----------------------------     -----------------------------         PCT >= 0.50 ng/mL              PCT 0.26 - 0.50 ng/mL                AND       <80% decrease in PCT             Encourage initiation of                                             antibiotics       Encourage continuation           of antibiotics    ----------------------------     -----------------------------        PCT >= 0.50 ng/mL                  PCT > 0.50 ng/mL               AND  increase in PCT                  Strongly encourage                                      initiation of antibiotics    Strongly encourage escalation           of antibiotics                                     -----------------------------                                            PCT <= 0.25 ng/mL                                                 OR                                        > 80% decrease in PCT                                     Discontinue / Do not initiate                                             antibiotics Performed at [facility], [address] [place], [place] [id number]   Glucose, capillary     Status: Abnormal   Collection Time: 01/23/20  7:55 AM  Result Value Ref Range   Glucose-Capillary 162 (H) 70 - 99 mg/dL    Comment: Glucose reference range applies only to samples taken after fasting for at least 8 hours.   Comment 1 Notify RN    Comment 2 Document in Chart     Imaging / Studies: DG Chest Port 1 View  Result Date: 01/23/2020 CLINICAL DATA:  Acute respiratory failure with hypoxemia. EXAM: PORTABLE CHEST 1 VIEW COMPARISON:  One-view chest x-ray 01/21/2020. FINDINGS: Heart size is exaggerated by low lung volumes. Endotracheal tube remains low, 1.5 cm above the carina. Left-sided PICC line is stable. Right IJ line was removed. Diffuse interstitial edema remains. Asymmetric left basilar airspace disease is noted. IMPRESSION: 1. Stable appearance of diffuse interstitial edema. 2. Left basilar airspace disease likely reflects atelectasis. Infection is not excluded. Electronically Signed   By: Marin Robertshristopher  Mattern M.D.   On: 01/23/2020 08:04   DG Chest Port 1 View  Result Date: 01/21/2020 CLINICAL DATA:  Acute respiratory failure with hypoxemia. EXAM: PORTABLE CHEST 1 VIEW COMPARISON:  Chest radiograph 01/18/2019 FINDINGS: ET tube terminates 1.5 cm above the level of carina. A new left-sided PICC terminates within the right atrium. A right IJ approach central venous catheter terminates within the right atrium. An enteric tube passes below the level of the left  hemidiaphragm, coils within the stomach and terminates within the mid to distal stomach. Heart size within normal limits. Interval increase in  ill-defined opacity within the mid to the lower left lung which may reflect pneumonia, edema and/or atelectasis. Minimal atelectasis within the right lung base. No evidence of pneumothorax. No acute bony abnormality. IMPRESSION: Support apparatus as described. Please note the enteric tube coils within the stomach. Interval increase in ill-defined opacity within the mid to lower left lung which may reflect pneumonia, edema and/or atelectasis. Minimal atelectasis within the right lung base. Electronically Signed   By: Jackey Loge DO   On: 01/21/2020 11:17    Medications / Allergies: per chart  Antibiotics: Anti-infectives (From admission, onward)   Start     Dose/Rate Route Frequency Ordered Stop   01/18/20 1400  piperacillin-tazobactam (ZOSYN) IVPB 3.375 g     3.375 g 12.5 mL/hr over 240 Minutes Intravenous Every 8 hours 01/18/20 1311     01/18/20 0715  piperacillin-tazobactam (ZOSYN) IVPB 3.375 g     3.375 g 100 mL/hr over 30 Minutes Intravenous  Once 01/18/20 0707 01/18/20 1610        Note: Portions of this report may have been transcribed using voice recognition software. Every effort was made to ensure accuracy; however, inadvertent computerized transcription errors may be present.   Any transcriptional errors that result from this process are unintentional.    Ardeth Sportsman, MD, FACS, MASCRS Gastrointestinal and Minimally Invasive Surgery  [facility] [address], [address] [place], [place] [address] [phone] Fax [phone] Main/Paging  CONTACT INFORMATION: Weekday (9AM-5PM) concerns: Call [facility] main office at [phone] Weeknight (5PM-9AM) or Weekend/Holiday concerns: Check [email or web address] for General Surgery [facility] coverage (Please, do not use SecureChat as it is not reliable communication to surgeons for patient care)      01/23/2020  8:40 AM

## 2020-01-23 NOTE — Progress Notes (Signed)
PHARMACY - TOTAL PARENTERAL NUTRITION CONSULT NOTE   Indication: Prolonged ileus  Patient Measurements: Height: 5\' 1"  (154.9 cm) Weight: 67 kg (147 lb 11.3 oz) IBW/kg (Calculated) : 47.8   Body mass index is 27.91 kg/m. Usual Weight: 67 kg  Assessment: 59 yo F s/p Sigmoid colon perforation secondary to chronic constipation and ulceration with feculent peritonitis and gross contamination of abdominal cavity with stool balls  -S/p Exploratory laparotomy with partial colectomy and end colostomy 5/5 Dr. 46 5/6 PICC placed and started TPN for nutrition support; expect prolonged ileus  Glucose / Insulin: prediabetes, A1c 6.3, CBGs 127-179, serum glucose 166 (18 units SSI/24hr) Electrolytes: phos 4.4 with max in TPN x 2 days, Mag 1.8 (goal >=2) ,  K 4.2 s/p 2 Luisa Hart runs yesterday (goal >=4); CoCa 9.76 Renal: Scr WNL, 3310cc UOP over past 24hr LFTs / TGs: AST sl elevated at 43;, Trig 27> 331> 434>270>210, propofol dc'd 5/7 and no lipids in TPN Prealbumin / albumin: prealbumin 9.7 5/6 > now < 5 reflects inflammation of post-op stress and critical illness, admit alb 4.5 WNL, BMI 27.9, well-nourished PTA, poor PO intake for 5 days PTA Intake / Output; MIVF: IVF dc'd, fent at 150 mcg/hr; precedex at 0.8 mcg/kg/hr GI Imaging: Surgeries / Procedures:   -S/p Exploratory laparotomy with partial colectomy and end colostomy 5/5 Dr. 7/7 access: PICC 5/6 for TPN TPN start date: 01/19/2020  Nutritional Goals (per RD recommendation on 01/19/2020): kCal: 1584, Protein: 114-130, Fluid: >= 1.5 L/day  Goal TPN rate is 75 mL/hr (provides 117 g of protein and ~1570 kcals per day) without lipids Current Nutrition:  TPN at 75 ml/hr  Plan:  Mg Sulfate 2 gm IV now Continue TPN at goal rate of 75 ml/h  Hold lipid emulsion for first 7 days for critically ill patients per ASPEN guidelines (Start date 01/25/20) Electrolytes in TPN: 24mEq/L of Na, 54mEq/L of K, 57mEq/L of Ca, 51mEq/L of Mg, and  51mmol/L of Phos . Cl:Ac ratio 1:1 Add standard MVI and trace elements to TPN Continue q4h SSI, adjusted to standard scale per CCM  Continue pepcid 40 mg/day in TPN Monitor TPN labs on Mon/Thurs, check CMET/Mg/Phos in AM  12m, PharmD, BCPS Pharmacy: [phone] 01/23/2020 7:08 AM

## 2020-01-23 NOTE — Progress Notes (Signed)
eLink Physician-Brief Progress Note Patient Name: Darlene Castillo DOB: 1961/03/13 MRN: [id number]   Date of Service  01/23/2020  HPI/Events of Note  Notified that patient appeared to have a panic attack while being bathe. BP 202/117  HR 120. On Fentanyl 250 and Precedex 1.2  eICU Interventions  Ordered a one time dose of Versed 2 mg     Intervention Category Minor Interventions: Agitation / anxiety - evaluation and management  Darl Pikes 01/23/2020, 6:48 AM

## 2020-01-24 ENCOUNTER — Inpatient Hospital Stay (HOSPITAL_COMMUNITY): Payer: BLUE CROSS/BLUE SHIELD

## 2020-01-24 DIAGNOSIS — F1120 Opioid dependence, uncomplicated: Secondary | ICD-10-CM

## 2020-01-24 DIAGNOSIS — R739 Hyperglycemia, unspecified: Secondary | ICD-10-CM

## 2020-01-24 DIAGNOSIS — E1165 Type 2 diabetes mellitus with hyperglycemia: Secondary | ICD-10-CM

## 2020-01-24 DIAGNOSIS — IMO0002 Reserved for concepts with insufficient information to code with codable children: Secondary | ICD-10-CM

## 2020-01-24 DIAGNOSIS — G9340 Encephalopathy, unspecified: Secondary | ICD-10-CM

## 2020-01-24 DIAGNOSIS — G8929 Other chronic pain: Secondary | ICD-10-CM

## 2020-01-24 DIAGNOSIS — K5909 Other constipation: Secondary | ICD-10-CM

## 2020-01-24 LAB — COMPREHENSIVE METABOLIC PANEL
ALT: 23 U/L (ref 0–44)
AST: 37 U/L (ref 15–41)
Albumin: 1.8 g/dL — ABNORMAL LOW (ref 3.5–5.0)
Alkaline Phosphatase: 134 U/L — ABNORMAL HIGH (ref 38–126)
Anion gap: 7 (ref 5–15)
BUN: 21 mg/dL — ABNORMAL HIGH (ref 6–20)
CO2: 26 mmol/L (ref 22–32)
Calcium: 8.4 mg/dL — ABNORMAL LOW (ref 8.9–10.3)
Chloride: 107 mmol/L (ref 98–111)
Creatinine, Ser: 0.62 mg/dL (ref 0.44–1.00)
GFR calc Af Amer: >60 mL/min (ref >60)
GFR calc non Af Amer: >60 mL/min (ref >60)
Glucose, Bld: 255 mg/dL — ABNORMAL HIGH (ref 70–99)
Potassium: 4 mmol/L (ref 3.5–5.1)
Sodium: 140 mmol/L (ref 135–145)
Total Bilirubin: 0.6 mg/dL (ref 0.3–1.2)
Total Protein: 5.6 g/dL — ABNORMAL LOW (ref 6.5–8.1)

## 2020-01-24 LAB — MAGNESIUM: Magnesium: 2.1 mg/dL (ref 1.7–2.4)

## 2020-01-24 LAB — GLUCOSE, CAPILLARY
Glucose-Capillary: 202 mg/dL — ABNORMAL HIGH (ref 70–99)
Glucose-Capillary: 216 mg/dL — ABNORMAL HIGH (ref 70–99)
Glucose-Capillary: 213 mg/dL — ABNORMAL HIGH (ref 70–99)
Glucose-Capillary: 164 mg/dL — ABNORMAL HIGH (ref 70–99)
Glucose-Capillary: 125 mg/dL — ABNORMAL HIGH (ref 70–99)

## 2020-01-24 LAB — PHOSPHORUS: Phosphorus: 3.5 mg/dL (ref 2.5–4.6)

## 2020-01-24 MED ORDER — DIPHENHYDRAMINE HCL 50 MG/ML IJ SOLN: 12.5000 mg | Freq: Four times a day (QID) | INTRAMUSCULAR | Status: DC | PRN

## 2020-01-24 MED ORDER — DEXMEDETOMIDINE HCL IN NACL 400 MCG/100ML IV SOLN
0.4000 ug/kg/h | INTRAVENOUS | Status: DC
  Administered 2020-01-24: 1.2 ug/kg/h via INTRAVENOUS
  Administered 2020-01-24: 1 ug/kg/h via INTRAVENOUS
  Administered 2020-01-24 – 2020-01-25 (×2): 1.2 ug/kg/h via INTRAVENOUS
  Filled 2020-01-24 (×4): qty 100

## 2020-01-24 MED ORDER — ONDANSETRON HCL 4 MG/2ML IJ SOLN: 4.0000 mg | Freq: Four times a day (QID) | INTRAMUSCULAR | Status: DC | PRN

## 2020-01-24 MED ORDER — QUETIAPINE FUMARATE 50 MG PO TABS
25.0000 mg | ORAL_TABLET | Freq: Every day | ORAL | Status: DC
  Administered 2020-01-24 – 2020-01-26 (×4): 25 mg
  Filled 2020-01-24 (×4): qty 1

## 2020-01-24 MED ORDER — FENTANYL CITRATE (PF) 100 MCG/2ML IJ SOLN
0.0000 ug | INTRAMUSCULAR | Status: DC | PRN
  Filled 2020-01-24: qty 2

## 2020-01-24 MED ORDER — IOHEXOL 300 MG/ML  SOLN
100.0000 mL | Freq: Once | INTRAMUSCULAR | Status: AC | PRN
  Administered 2020-01-24: 100 mL via INTRAVENOUS

## 2020-01-24 MED ORDER — IOHEXOL 9 MG/ML PO SOLN
500.0000 mL | ORAL | Status: AC
  Administered 2020-01-24 (×2): 500 mL via ORAL

## 2020-01-24 MED ORDER — HYDROMORPHONE 1 MG/ML IV SOLN
INTRAVENOUS | Status: DC
  Administered 2020-01-24: 30 mg via INTRAVENOUS
  Administered 2020-01-24: 3.2 mg via INTRAVENOUS
  Administered 2020-01-24: 30 mg via INTRAVENOUS
  Administered 2020-01-25: 1.8 mg via INTRAVENOUS
  Administered 2020-01-25: 1.2 mg via INTRAVENOUS
  Filled 2020-01-24: qty 30

## 2020-01-24 MED ORDER — TRAVASOL 10 % IV SOLN
INTRAVENOUS | Status: AC
  Filled 2020-01-24: qty 864

## 2020-01-24 MED ORDER — MIDAZOLAM HCL 2 MG/2ML IJ SOLN
0.5000 mg | Freq: Once | INTRAMUSCULAR | Status: AC
  Administered 2020-01-24: 0.5 mg via INTRAVENOUS

## 2020-01-24 MED ORDER — MIDAZOLAM HCL 2 MG/2ML IJ SOLN
INTRAMUSCULAR | Status: AC
  Filled 2020-01-24: qty 2

## 2020-01-24 MED ORDER — QUETIAPINE FUMARATE 50 MG PO TABS: 50.0000 mg | ORAL_TABLET | Freq: Every day | ORAL | Status: DC

## 2020-01-24 MED ORDER — SODIUM CHLORIDE 0.9% FLUSH: 9.0000 mL | INTRAVENOUS | Status: DC | PRN

## 2020-01-24 MED ORDER — MIDAZOLAM HCL 2 MG/2ML IJ SOLN
0.0000 mg | INTRAMUSCULAR | Status: DC | PRN
  Administered 2020-01-24: 1 mg via INTRAVENOUS
  Filled 2020-01-24: qty 2

## 2020-01-24 MED ORDER — CLONIDINE HCL 0.1 MG PO TABS
0.1000 mg | ORAL_TABLET | Freq: Four times a day (QID) | ORAL | Status: DC
  Administered 2020-01-24 – 2020-01-27 (×10): 0.1 mg
  Filled 2020-01-24 (×10): qty 1

## 2020-01-24 MED ORDER — CLONIDINE HCL 0.1 MG PO TABS: 0.1000 mg | ORAL_TABLET | Freq: Four times a day (QID) | ORAL | Status: DC

## 2020-01-24 MED ORDER — SODIUM CHLORIDE (PF) 0.9 % IJ SOLN
INTRAMUSCULAR | Status: AC
  Filled 2020-01-24: qty 50

## 2020-01-24 MED ORDER — DIPHENHYDRAMINE HCL 12.5 MG/5ML PO ELIX: 12.5000 mg | ORAL_SOLUTION | Freq: Four times a day (QID) | ORAL | Status: DC | PRN

## 2020-01-24 MED ORDER — NALOXONE HCL 0.4 MG/ML IJ SOLN: 0.4000 mg | INTRAMUSCULAR | Status: DC | PRN

## 2020-01-24 MED ORDER — IOHEXOL 9 MG/ML PO SOLN
ORAL | Status: AC
  Administered 2020-01-24: 500 mL
  Filled 2020-01-24: qty 1000

## 2020-01-24 MED ORDER — FENTANYL CITRATE (PF) 100 MCG/2ML IJ SOLN
50.0000 ug | INTRAMUSCULAR | Status: DC | PRN
  Administered 2020-01-24 (×2): 200 ug via INTRAVENOUS
  Filled 2020-01-24 (×2): qty 4

## 2020-01-24 NOTE — Progress Notes (Addendum)
Pharmacy Antibiotic Note  Darlene Castillo is a 59 y.o. female admitted on 01/18/2020 with sigmoid colon perforation.  Pharmacy has been consulted for zosyn dosing. 01/24/2020   D#7 Zosyn - planning at least 10d per MD notes, WBC 11.3, 101.6 5/10, SCr WNL, PCT 14.12. CT today to rule out intraabdominal abscess  Plan: Zosyn 3.375g IV q8h (4 hour infusion).  No further dose adjustments needed - pharmacy will sign off  Height: 5\' 1"  (154.9 cm) Weight: 67 kg (147 lb 11.3 oz) IBW/kg (Calculated) : 47.8  Temp (24hrs), Avg:99.1 F (37.3 C), Min:98.1 F (36.7 C), Max:101.6 F (38.7 C)  Recent Labs  Lab 01/18/20 1225 01/18/20 1340 01/18/20 1630 01/18/20 2040 01/18/20 2040 01/19/20 0158 01/19/20 0158 01/19/20 0835 01/19/20 0835 01/19/20 1300 01/20/20 0500 01/21/20 0430 01/22/20 0445 01/23/20 0345 01/24/20 0350  WBC  --    < >  --  2.3*  --  2.5*  --   --   --   --  7.6  --  9.3 11.3*  --   CREATININE  --    < >  --  0.83   < > 0.96   < > 1.03*   < >  --  0.87 0.75 0.72 0.64 0.62  LATICACIDVEN 7.3*  --  6.6*  --   --   --   --  2.8*  --  2.1*  --   --   --   --   --    < > = values in this interval not displayed.    Estimated Creatinine Clearance: 66.3 mL/min (by C-G formula based on SCr of 0.62 mg/dL).    Allergies  Allergen Reactions  . Ibuprofen Itching    Antimicrobials this admission: 5/5 zosyn>>  Dose adjustments this admission: -- Microbiology results: 5/5 flu/covid: neg 5/5 MRSA neg 5/5 HIV NR  Thank you for allowing pharmacy to be a part of this patient's care.  03/25/20, PharmD, BCPS Pharmacy: [phone] 01/24/2020 9:03 AM

## 2020-01-24 NOTE — TOC Progression Note (Signed)
Transition of Care [facility]) - Progression Note    Patient Details  Name: Darlene Castillo MRN: [id number] Date of Birth: 1961-04-25  Transition of [facility]) CM/SW Contact  Golda Acre, RN Phone Number: 01/24/2020, 8:15 AM  Clinical Narrative:    [id number]/remains on the vent, iv sedation, temp this am 101.6,Iv zosyn and iv tpn on going.  Hx of methadone dependence.   Expected Discharge Plan: Home/Self Care Barriers to Discharge: Continued Medical Work up  Expected Discharge Plan and Services Expected Discharge Plan: Home/Self Care       Living arrangements for the past 2 months: Single Family Home                                       Social Determinants of Health (SDOH) Interventions    Readmission Risk Interventions No flowsheet data found.

## 2020-01-24 NOTE — Procedures (Signed)
Extubation Procedure Note  Patient Details:   Name: MESA JANUS DOB: 03/30/1961 MRN: [id number]   Airway Documentation:    Vent end date: 01/24/20 Vent end time: 1657   Evaluation  O2 sats: stable throughout Complications: No apparent complications Patient did tolerate procedure well. Bilateral Breath Sounds: Diminished   No  Suzan Garibaldi 01/24/2020, 4:59 PM

## 2020-01-24 NOTE — Progress Notes (Signed)
[facility] Progress Note  6 Days Post-Op  Subjective: CC-  Alert on the vent, family at bedside. Working on weaning the vent. Started on tube feedings at 20cc/hr yesterday, seems to be tolerating well. WOC RN irrigated stoma yesterday with return of small chunks of brown formed stool.    Objective: Vital signs in last 24 hours: Temp:  [98.1 F (36.7 C)-101.6 F (38.7 C)] 98.3 F (36.8 C) (05/11 0802) Pulse Rate:  [76-125] 88 (05/11 0700) Resp:  [24-41] 31 (05/11 0700) BP: (85-183)/(43-94) 121/73 (05/11 0700) SpO2:  [95 %-100 %] 100 % (05/11 0802) FiO2 (%):  [30 %-40 %] 30 % (05/11 0802) Last BM Date: (PTA)  Intake/Output from previous day: 05/10 0701 - 05/11 0700 In: 2627.7 [I.V.:2483.7; IV Piggyback:144] Out: 4295 [Urine:3625; Emesis/NG output:100; Drains:20; Stool:550] Intake/Output this shift: No intake/output data recorded.  PE: Gen:  Alert, NAD Card:  RRR Pulm: mechanically ventilated Abd: Soft, distended, hypoactive BS, vac to midline incision with good seal, ostomy viable with scant liquid stool and air in pouch  Lab Results:  Recent Labs    01/22/20 0445 01/23/20 0345  WBC 9.3 11.3*  HGB 8.9* 9.0*  HCT 26.4* 26.8*  PLT 148* 181   BMET Recent Labs    01/23/20 0345 01/24/20 0350  NA 139 140  K 4.2 4.0  CL 106 107  CO2 24 26  GLUCOSE 166* 255*  BUN 18 21*  CREATININE 0.64 0.62  CALCIUM 8.0* 8.4*   PT/INR No results for input(s): LABPROT, INR in the last 72 hours. CMP     Component Value Date/Time   NA 140 01/24/2020 0350   K 4.0 01/24/2020 0350   CL 107 01/24/2020 0350   CO2 26 01/24/2020 0350   GLUCOSE 255 (H) 01/24/2020 0350   BUN 21 (H) 01/24/2020 0350   CREATININE 0.62 01/24/2020 0350   CALCIUM 8.4 (L) 01/24/2020 0350   PROT 5.6 (L) 01/24/2020 0350   ALBUMIN 1.8 (L) 01/24/2020 0350   AST 37 01/24/2020 0350   ALT 23 01/24/2020 0350   ALKPHOS 134 (H) 01/24/2020 0350   BILITOT 0.6 01/24/2020 0350   GFRNONAA >60  01/24/2020 0350   GFRAA >60 01/24/2020 0350   Lipase     Component Value Date/Time   LIPASE 19 01/18/2020 0525       Studies/Results: DG Chest Port 1 View  Result Date: 01/23/2020 CLINICAL DATA:  Acute respiratory failure with hypoxemia. EXAM: PORTABLE CHEST 1 VIEW COMPARISON:  One-view chest x-ray 01/21/2020. FINDINGS: Heart size is exaggerated by low lung volumes. Endotracheal tube remains low, 1.5 cm above the carina. Left-sided PICC line is stable. Right IJ line was removed. Diffuse interstitial edema remains. Asymmetric left basilar airspace disease is noted. IMPRESSION: 1. Stable appearance of diffuse interstitial edema. 2. Left basilar airspace disease likely reflects atelectasis. Infection is not excluded. Electronically Signed   By: Marin Roberts M.D.   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 Brantley Stage - POD#6 - surgical path: Perforation with acute inflammation and acute serositis, no evidence of malignancy - wound vac MWF - WOC RN following for new colostomy  and vac - continue IV zosyn - appreciate CCM assistance  ID -zosyn 5/5>>day#6 FEN -NPO/NGT, TPN, trickle TF VTE -SCDs, lovenox Foley -continue for strict I&O  Plan: Appreciate CCM assistance. With leukocytosis and temp up to 101.6, will check CT scan today to rule out intraabdominal abscess. Continue tube feedings at 20cc/hr, check residual.   LOS: 6 days    Wellington Hampshire, [facility] 01/24/2020, 9:01 AM Please see Amion for pager number during day hours 7:00am-4:30pm

## 2020-01-24 NOTE — Progress Notes (Signed)
PCCM Interval Progress Note  Called to bedside to evaluate pt for significant anxiety and agitaiton.  Pt very anxious despite precedex at 1 and dilaudid PCA.  She is pointing at ETT and trying to mouth words.   When asked if she is asking for the tube to come out, she emphatically nods "yes".   I flipped her to PSV 5/5 and she had Vt around 300 range though she was also breathing 35 - 40 times per minute.  When encouraged to slow her RR, Vt improved to 500s. She is fully awake, able to follow all commands, has strong cough.  Decision made to attempt extubation despite tachypnea as we felt that tachypnea was due to discomfort and anxiety of ETT.  Pt extubated to 4L [place] and so far is tolerating well.  She is thanking [place] for taking ETT out and mouthing "much better".  I have discontinued her PRN fentanyl but will leave dilaudid PCA and precedex infusion on for now due to her anxiety and pain. Have added scheduled seroquel and clonidine in hopes of getting her off the precedex.  I have asked RN to assess QTc so that we have a baseline. Hopefully we can avoid re-intubation overnight as if she gets re-intubated, we will once again face a cycle of oversedation whenever she becomes anxious (as has occurred the past 2 days).   Rutherford Guys, [place] [facility] Pul[facility] Care Medicine 01/24/2020, 5:12 PM

## 2020-01-24 NOTE — Progress Notes (Signed)
PHARMACY - TOTAL PARENTERAL NUTRITION CONSULT NOTE   Indication: Prolonged ileus  Patient Measurements: Height: 5\' 1"  (154.9 cm) Weight: 67 kg (147 lb 11.3 oz) IBW/kg (Calculated) : 47.8   Body mass index is 27.91 kg/m. Usual Weight: 67 kg  Assessment: 59 yo F s/p Sigmoid colon perforation secondary to chronic constipation and ulceration with feculent peritonitis and gross contamination of abdominal cavity with stool balls  -S/p Exploratory laparotomy with partial colectomy and end colostomy 5/5 Dr. 46 5/6 PICC placed and started TPN for nutrition support; expect prolonged ileus  Glucose / Insulin: prediabetes, A1c 6.3, CBGs rising 162-218, serum glucose 255 (48 units SSI/24hr) Electrolytes: phos 3.5, Mag 2.1 (goal >=2) ,  K 4.0 (goal >=4); CoCa 10.16 Renal: Scr WNL, 3625cc UOP over past 24hr LFTs / TGs: WNL; Trig 27> 331> 434 > 270 > 210, propofol dc'd 5/7 and no lipids in TPN Prealbumin / albumin: prealbumin 9.7 5/6 > now < 5 reflects inflammation of post-op stress and critical illness, admit alb 4.5 WNL, BMI 27.9, well-nourished PTA, poor PO intake for 5 days PTA Intake / Output; MIVF: IVF dc'd, fent at 100 mcg/hr; precedex at 1.2 mcg/kg/hr GI Imaging: Surgeries / Procedures:   -S/p Exploratory laparotomy with partial colectomy and end colostomy 5/5 Dr. 7/7 access: PICC 5/6 for TPN TPN start date: 01/19/2020  Nutritional Goals (per RD recommendation updated on 01/23/2020): kCal: 1379, Protein: 114-130, Fluid: >= 1.3 L/day  Goal TPN rate is 75 mL/hr (provides 117 g of protein and ~1570 kcals per day) without lipids  Current Nutrition:  TPN at 75 ml/hr Vital HP at 20 ml/hr - 480 kcal, 42 g protein  Plan:  At 18:00, Decrease TPN rate to 60 ml/hr with the addition of TF and decrease dextrose concentration to 15% to assist with hyperglycemia. Total kcal = 1560, total gram protein = 128. Hold lipid emulsion for first 7 days for critically ill patients per ASPEN  guidelines (Start date 01/25/20) Electrolytes in TPN: 35mEq/L of Na, 73mEq/L of K, decrease 47mEq/L of Ca, 43mEq/L of Mg, and 2mmol/L of Phos . Cl:Ac ratio 1:1 Add standard MVI and trace elements to TPN Continue q4h SSI, adjusted to resistant scale   Continue pepcid 40 mg/day in TPN Monitor TPN labs on Mon/Thurs, check CMET/Mg/Phos in AM  12m, PharmD, BCPS Pharmacy: [phone] 01/24/2020 7:19 AM

## 2020-01-24 NOTE — Progress Notes (Signed)
eLink Physician-Brief Progress Note Patient Name: Darlene Castillo DOB: [birth year] MRN: [id number]   Date of Service  01/24/2020  HPI/Events of Note  Anxiety - Patient extubated earlier today. Her respiratory status is very marginal on video assessment. Sat = 94% on 3 L/min [place] O2. RR = 35-50. Nursing thinks this is all anxiety. However, it clearly isn't all anxiety given her recent abdominal surgery.    eICU Interventions  Will order: 1. Versed 0.5 mg IV X 1 now.  2. Nursing will need to monitor closely for need for intubation and ventilation.      Intervention Category Major Interventions: Delirium, psychosis, severe agitation - evaluation and management  Sommer,Steven Eugene 01/24/2020, 7:53 PM

## 2020-01-24 NOTE — Progress Notes (Signed)
NAME:  Darlene Castillo, MRN:  [id number], DOB:  1961-06-13, LOS: 6 ADMISSION DATE:  01/18/2020, CONSULTATION DATE:  01/18/20 REFERRING MD:  Darlene Castillo CHIEF COMPLAINT:  VDRF, septic shock  Brief History   Peritonitis 2/2 sigmoid colon perforation s/p ex-lap 5/5 with resection and end colostomy.  History of present illness   Darlene Castillo is a 89 yobf smoker  who presented early am 5/5  with abdominal pain, constipation, one episode of vomiting and was found to have a perforated sigmoid colon and peritonitis.  She went to the OR for ex lap and return to the ICU intubated.  Past Medical History  Chronic opiate use Tobacco abuse DM HTN  Significant Hospital Events   5/5 ex lap with partial colectomy and end colostomy for perf:  neg path    Consults:  CCS PCCM  Procedures:  5/5 CVL RIJ 5/5 A-line 5/7  5/5 ET 5/6 PIC L brachial   Significant Diagnostic Tests:  CT abdomen pelvis with contrast 01/17/2020-scattered groundglass opacities in the lower lungs bilaterally, no focal opacities.  Abdominal free air.  Multiple dilated loops of bowel with stool.  Large R renal cyst. Path 5/5 >>> - Perforation with acute inflammation and acute serositis. No evidence of malignancy.    Micro Data:  Covid negative  Antimicrobials:  Zosyn 5/5  >>  Interim history/subjective:  Sedated again this AM, not ready for SBT.  Objective   Blood pressure 121/73, pulse 88, temperature 98.3 F (36.8 C), temperature source Axillary, resp. rate (!) 31, height 5\' 1"  (1.549 m), weight 67 kg, last menstrual period 05/23/2013, SpO2 100 %.    Vent Mode: PRVC FiO2 (%):  [30 %-40 %] 30 % Set Rate:  [26 bmp] 26 bmp Vt Set:  [330 mL] 330 mL PEEP:  [5 cmH20] 5 cmH20 Pressure Support:  [5 cmH20-10 cmH20] 5 cmH20 Plateau Pressure:  [18 cmH20-25 cmH20] 22 cmH20   Intake/Output Summary (Last 24 hours) at 01/24/2020 0822 Last data filed at 01/24/2020 0650 Gross per 24 hour  Intake 2348.64 ml  Output 4295 ml  Net  -1946.36 ml   Filed Weights   01/18/20 1300  Weight: 67 kg       Examination: General: Adult female, sedated, in NAD. Neuro: Sedated, follows commands when aroused / stimulated. HEENT: [place]/AT. Sclerae anicteric. ETT in place. Cardiovascular: RRR, no M/R/G.  Lungs: Respirations even and unlabored.  CTA bilaterally, No W/R/R.  Abdomen: Colostomy with small liquid brown mucus output.  BS x 4, soft, NT/ND.  Musculoskeletal: No gross deformities, no edema.  Skin: Intact, warm, no rashes.   Assessment & Plan:   Acute hypoxic respiratory failure requiring mechanical ventilation. Possible VAP. - Avoid oversedation! - PRN doses decreased and fentanyl ceiling decreased - Attempt SBT this AM once more awake. - Hopeful for extubation but pain control and agitation will remain an issue (she has chronic opiate use prior to admission). - Continue empiric zosyn (planned 10 days minimum for VAP + peritonitis). - Bronchial hygiene. - Follow CXR.  Septic shock due to peritonitis 2/2 sigmoid colon perforation from chronic constipation - s/p ex lap with sigmoid resection and colostomy. - Per CCS. - Continue empiric zosyn.  Malnutrition. Expected prolonged ileus post op. - Continue TPN.  Chronic methadone use - Continue methadone 7.5 mg Q6h as needed.  - Wean fentanyl / precedex as able.   Rest per primary team.   Best practice:  Diet: NPO.  TPN for now. Pain/Anxiety/Delirium protocol (if indicated): Fentanyl gtt /  Precedex gtt / Midazolam PRN, ceiling of fentanyl and PRN doses fent / midaz decreased to hopefully limit oversedation which has limited SBT trials.  RASS goal 0. VAP protocol (if indicated): In place. DVT prophylaxis: SCDs and lovenox. GI prophylaxis: pepcid. Glucose control: SSI. Mobility: bedrest. Code Status: full. Family Communication: Per primary. Disposition: ICU.  CC time: 30 min.   Darlene Castillo, [place] [facility] 01/24/2020,  8:22 AM

## 2020-01-25 ENCOUNTER — Inpatient Hospital Stay (HOSPITAL_COMMUNITY): Payer: BLUE CROSS/BLUE SHIELD

## 2020-01-25 LAB — GLUCOSE, CAPILLARY
Glucose-Capillary: 141 mg/dL — ABNORMAL HIGH (ref 70–99)
Glucose-Capillary: 147 mg/dL — ABNORMAL HIGH (ref 70–99)
Glucose-Capillary: 152 mg/dL — ABNORMAL HIGH (ref 70–99)
Glucose-Capillary: 160 mg/dL — ABNORMAL HIGH (ref 70–99)
Glucose-Capillary: 168 mg/dL — ABNORMAL HIGH (ref 70–99)
Glucose-Capillary: 174 mg/dL — ABNORMAL HIGH (ref 70–99)
Glucose-Capillary: 195 mg/dL — ABNORMAL HIGH (ref 70–99)

## 2020-01-25 LAB — COMPREHENSIVE METABOLIC PANEL
ALT: 28 U/L (ref 0–44)
ALT: 26 U/L (ref 0–44)
AST: 39 U/L (ref 15–41)
AST: 36 U/L (ref 15–41)
Albumin: 1.9 g/dL — ABNORMAL LOW (ref 3.5–5.0)
Albumin: 1.9 g/dL — ABNORMAL LOW (ref 3.5–5.0)
Alkaline Phosphatase: 170 U/L — ABNORMAL HIGH (ref 38–126)
Alkaline Phosphatase: 182 U/L — ABNORMAL HIGH (ref 38–126)
Anion gap: 8 (ref 5–15)
Anion gap: 11 (ref 5–15)
BUN: 20 mg/dL (ref 6–20)
BUN: 19 mg/dL (ref 6–20)
CO2: 27 mmol/L (ref 22–32)
CO2: 28 mmol/L (ref 22–32)
Calcium: 8.1 mg/dL — ABNORMAL LOW (ref 8.9–10.3)
Calcium: 8.4 mg/dL — ABNORMAL LOW (ref 8.9–10.3)
Chloride: 105 mmol/L (ref 98–111)
Chloride: 106 mmol/L (ref 98–111)
Creatinine, Ser: 0.63 mg/dL (ref 0.44–1.00)
Creatinine, Ser: 0.65 mg/dL (ref 0.44–1.00)
GFR calc Af Amer: >60 mL/min (ref >60)
GFR calc Af Amer: >60 mL/min (ref >60)
GFR calc non Af Amer: >60 mL/min (ref >60)
GFR calc non Af Amer: >60 mL/min (ref >60)
Glucose, Bld: 560 mg/dL (ref 70–99)
Glucose, Bld: 181 mg/dL — ABNORMAL HIGH (ref 70–99)
Potassium: 5.6 mmol/L — ABNORMAL HIGH (ref 3.5–5.1)
Potassium: 4.1 mmol/L (ref 3.5–5.1)
Sodium: 140 mmol/L (ref 135–145)
Sodium: 145 mmol/L (ref 135–145)
Total Bilirubin: 0.5 mg/dL (ref 0.3–1.2)
Total Bilirubin: 0.5 mg/dL (ref 0.3–1.2)
Total Protein: 5.7 g/dL — ABNORMAL LOW (ref 6.5–8.1)
Total Protein: 5.9 g/dL — ABNORMAL LOW (ref 6.5–8.1)

## 2020-01-25 LAB — PROCALCITONIN: Procalcitonin: 4.87 ng/mL

## 2020-01-25 LAB — BLOOD GAS, ARTERIAL
Acid-Base Excess: 4.7 mmol/L — ABNORMAL HIGH (ref 0.0–2.0)
Allens test (pass/fail): NEGATIVE — AB
Bicarbonate: 30 mmol/L — ABNORMAL HIGH (ref 20.0–28.0)
Drawn by: 257701
FIO2: 100
MECHVT: 330 mL
O2 Saturation: 90.1 %
PEEP: 5 cmH2O
Patient temperature: 97.9
RATE: 26 resp/min
pCO2 arterial: 56.7 mmHg — ABNORMAL HIGH (ref 32.0–48.0)
pH, Arterial: 7.342 — ABNORMAL LOW (ref 7.350–7.450)
pO2, Arterial: 66.8 mmHg — ABNORMAL LOW (ref 83.0–108.0)

## 2020-01-25 LAB — CBC
HCT: 25.7 % — ABNORMAL LOW (ref 36.0–46.0)
Hemoglobin: 8.6 g/dL — ABNORMAL LOW (ref 12.0–15.0)
MCH: 28.2 pg (ref 26.0–34.0)
MCHC: 33.5 g/dL (ref 30.0–36.0)
MCV: 84.3 fL (ref 80.0–100.0)
Platelets: 285 10*3/uL (ref 150–400)
RBC: 3.05 MIL/uL — ABNORMAL LOW (ref 3.87–5.11)
RDW: 14.1 % (ref 11.5–15.5)
WBC: 21.4 10*3/uL — ABNORMAL HIGH (ref 4.0–10.5)
nRBC: 0.2 % (ref 0.0–0.2)

## 2020-01-25 LAB — URINALYSIS, ROUTINE W REFLEX MICROSCOPIC
Bilirubin Urine: NEGATIVE
Glucose, UA: NEGATIVE mg/dL
Hgb urine dipstick: NEGATIVE
Ketones, ur: NEGATIVE mg/dL
Leukocytes,Ua: NEGATIVE
Nitrite: NEGATIVE
Protein, ur: 30 mg/dL — AB
Specific Gravity, Urine: 1.018 (ref 1.005–1.030)
pH: 6 (ref 5.0–8.0)

## 2020-01-25 LAB — LACTIC ACID, PLASMA: Lactic Acid, Venous: 1 mmol/L (ref 0.5–1.9)

## 2020-01-25 LAB — PHOSPHORUS
Phosphorus: 5.4 mg/dL — ABNORMAL HIGH (ref 2.5–4.6)
Phosphorus: 4.5 mg/dL (ref 2.5–4.6)

## 2020-01-25 LAB — MAGNESIUM
Magnesium: 2.2 mg/dL (ref 1.7–2.4)
Magnesium: 2 mg/dL (ref 1.7–2.4)

## 2020-01-25 MED ORDER — MIDAZOLAM 50MG/50ML (1MG/ML) PREMIX INFUSION
INTRAVENOUS | Status: AC
  Administered 2020-01-25: 0.5 mg/h via INTRAVENOUS
  Filled 2020-01-25: qty 50

## 2020-01-25 MED ORDER — HYDROMORPHONE HCL 1 MG/ML IJ SOLN
1.0000 mg | INTRAMUSCULAR | Status: AC | PRN
  Administered 2020-01-25 – 2020-01-26 (×3): 1 mg via INTRAVENOUS

## 2020-01-25 MED ORDER — LACTATED RINGERS IV BOLUS
500.0000 mL | Freq: Once | INTRAVENOUS | Status: AC
  Administered 2020-01-25: 500 mL via INTRAVENOUS

## 2020-01-25 MED ORDER — CHLORHEXIDINE GLUCONATE 0.12% ORAL RINSE (MEDLINE KIT)
15.0000 mL | Freq: Two times a day (BID) | OROMUCOSAL | Status: DC
  Administered 2020-01-25 – 2020-02-16 (×35): 15 mL via OROMUCOSAL

## 2020-01-25 MED ORDER — SODIUM CHLORIDE 0.9 % IV SOLN
0.5000 mg/h | INTRAVENOUS | Status: DC
  Administered 2020-01-25: 1 mg/h via INTRAVENOUS
  Administered 2020-01-26: 3 mg/h via INTRAVENOUS
  Administered 2020-01-26: 4 mg/h via INTRAVENOUS
  Administered 2020-01-27: 3 mg/h via INTRAVENOUS
  Administered 2020-01-27: 4 mg/h via INTRAVENOUS
  Administered 2020-01-28: 3 mg/h via INTRAVENOUS
  Administered 2020-01-29 – 2020-01-31 (×4): 4 mg/h via INTRAVENOUS
  Administered 2020-02-01: 3 mg/h via INTRAVENOUS
  Filled 2020-01-25 (×18): qty 5

## 2020-01-25 MED ORDER — SUCCINYLCHOLINE CHLORIDE 200 MG/10ML IV SOSY
PREFILLED_SYRINGE | INTRAVENOUS | Status: AC
  Filled 2020-01-25: qty 10

## 2020-01-25 MED ORDER — STERILE WATER FOR INJECTION IJ SOLN
INTRAMUSCULAR | Status: AC
  Filled 2020-01-25: qty 10

## 2020-01-25 MED ORDER — SODIUM CHLORIDE 0.9 % IV BOLUS
1000.0000 mL | Freq: Once | INTRAVENOUS | Status: AC
  Administered 2020-01-25: 1000 mL via INTRAVENOUS

## 2020-01-25 MED ORDER — HYDROMORPHONE HCL 1 MG/ML IJ SOLN: 1.0000 mg | INTRAMUSCULAR | Status: DC | PRN

## 2020-01-25 MED ORDER — SODIUM CHLORIDE 0.9 % IV SOLN
200.0000 mg | Freq: Once | INTRAVENOUS | Status: AC
  Administered 2020-01-25: 200 mg via INTRAVENOUS
  Filled 2020-01-25 (×2): qty 200

## 2020-01-25 MED ORDER — INSULIN ASPART 100 UNIT/ML ~~LOC~~ SOLN
2.0000 [IU] | SUBCUTANEOUS | Status: DC
  Administered 2020-01-25 (×2): 2 [IU] via SUBCUTANEOUS
  Administered 2020-01-25 – 2020-01-27 (×11): 4 [IU] via SUBCUTANEOUS
  Administered 2020-01-27: 6 [IU] via SUBCUTANEOUS
  Administered 2020-01-28 (×4): 2 [IU] via SUBCUTANEOUS
  Administered 2020-01-28: 6 [IU] via SUBCUTANEOUS
  Administered 2020-01-28 – 2020-01-29 (×3): 4 [IU] via SUBCUTANEOUS
  Administered 2020-01-29: 2 [IU] via SUBCUTANEOUS
  Administered 2020-01-29: 4 [IU] via SUBCUTANEOUS
  Administered 2020-01-30: 2 [IU] via SUBCUTANEOUS
  Administered 2020-01-30: 4 [IU] via SUBCUTANEOUS
  Administered 2020-01-31 (×2): 2 [IU] via SUBCUTANEOUS
  Administered 2020-01-31: 4 [IU] via SUBCUTANEOUS
  Administered 2020-02-01 (×2): 2 [IU] via SUBCUTANEOUS
  Administered 2020-02-01: 4 [IU] via SUBCUTANEOUS
  Administered 2020-02-01: 2 [IU] via SUBCUTANEOUS
  Administered 2020-02-01: 4 [IU] via SUBCUTANEOUS
  Administered 2020-02-02: 2 [IU] via SUBCUTANEOUS
  Administered 2020-02-02 (×2): 4 [IU] via SUBCUTANEOUS
  Administered 2020-02-03 – 2020-02-06 (×8): 2 [IU] via SUBCUTANEOUS

## 2020-01-25 MED ORDER — ORAL CARE MOUTH RINSE
15.0000 mL | OROMUCOSAL | Status: DC
  Administered 2020-01-25 – 2020-02-17 (×131): 15 mL via OROMUCOSAL

## 2020-01-25 MED ORDER — VECURONIUM BROMIDE 10 MG IV SOLR
INTRAVENOUS | Status: AC
  Filled 2020-01-25: qty 10

## 2020-01-25 MED ORDER — TRAVASOL 10 % IV SOLN
INTRAVENOUS | Status: AC
  Filled 2020-01-25: qty 1152

## 2020-01-25 MED ORDER — HYDROMORPHONE BOLUS VIA INFUSION
0.5000 mg | INTRAVENOUS | Status: DC | PRN
  Administered 2020-01-25 – 2020-02-01 (×17): 0.5 mg via INTRAVENOUS
  Filled 2020-01-25: qty 1

## 2020-01-25 MED ORDER — SODIUM CHLORIDE 0.9 % IV SOLN
100.0000 mg | INTRAVENOUS | Status: DC
  Administered 2020-01-26 – 2020-01-27 (×2): 100 mg via INTRAVENOUS
  Filled 2020-01-25 (×2): qty 100

## 2020-01-25 MED ORDER — FENTANYL CITRATE (PF) 100 MCG/2ML IJ SOLN
INTRAMUSCULAR | Status: AC
  Administered 2020-01-25: 100 ug
  Filled 2020-01-25: qty 2

## 2020-01-25 MED ORDER — ORAL CARE MOUTH RINSE
15.0000 mL | Freq: Two times a day (BID) | OROMUCOSAL | Status: DC
  Administered 2020-01-25: 15 mL via OROMUCOSAL

## 2020-01-25 MED ORDER — ROCURONIUM BROMIDE 10 MG/ML (PF) SYRINGE
PREFILLED_SYRINGE | INTRAVENOUS | Status: AC
  Administered 2020-01-25: 60 mg
  Filled 2020-01-25: qty 10

## 2020-01-25 MED ORDER — SODIUM CHLORIDE 0.9 % IV SOLN
INTRAVENOUS | Status: DC | PRN
  Administered 2020-01-25: 250 mL via INTRAVENOUS

## 2020-01-25 MED ORDER — HYDROMORPHONE HCL 1 MG/ML IJ SOLN
1.0000 mg | Freq: Once | INTRAMUSCULAR | Status: AC
  Administered 2020-01-25: 1 mg via INTRAVENOUS

## 2020-01-25 MED ORDER — PHENYLEPHRINE 40 MCG/ML (10ML) SYRINGE FOR IV PUSH (FOR BLOOD PRESSURE SUPPORT)
PREFILLED_SYRINGE | INTRAVENOUS | Status: AC
  Filled 2020-01-25: qty 10

## 2020-01-25 MED ORDER — MIDAZOLAM HCL 2 MG/2ML IJ SOLN
INTRAMUSCULAR | Status: AC
  Administered 2020-01-25: 4 mg
  Filled 2020-01-25: qty 4

## 2020-01-25 MED ORDER — MIDAZOLAM HCL 2 MG/2ML IJ SOLN
1.0000 mg | INTRAMUSCULAR | Status: AC | PRN
  Administered 2020-01-25 – 2020-01-26 (×3): 1 mg via INTRAVENOUS

## 2020-01-25 MED ORDER — MIDAZOLAM HCL 2 MG/2ML IJ SOLN
0.5000 mg | Freq: Once | INTRAMUSCULAR | Status: AC
  Administered 2020-01-25: 0.5 mg via INTRAVENOUS
  Filled 2020-01-25: qty 2

## 2020-01-25 MED ORDER — METHADONE HCL 10 MG/ML IJ SOLN
5.0000 mg | Freq: Four times a day (QID) | INTRAMUSCULAR | Status: DC
  Administered 2020-01-25 – 2020-01-27 (×8): 5 mg via INTRAVENOUS
  Filled 2020-01-25 (×11): qty 0.5

## 2020-01-25 MED ORDER — MIDAZOLAM HCL 2 MG/2ML IJ SOLN: 1.0000 mg | INTRAMUSCULAR | Status: DC | PRN

## 2020-01-25 MED ORDER — MIDAZOLAM BOLUS VIA INFUSION
1.0000 mg | INTRAVENOUS | Status: DC | PRN
  Administered 2020-01-25 – 2020-02-02 (×17): 1 mg via INTRAVENOUS
  Administered 2020-02-02 (×2): 2 mg via INTRAVENOUS
  Filled 2020-01-25: qty 1

## 2020-01-25 MED ORDER — FENTANYL 2500MCG IN NS 250ML (10MCG/ML) PREMIX INFUSION: 0.0000 ug/h | INTRAVENOUS | Status: DC

## 2020-01-25 MED ORDER — MIDAZOLAM 50MG/50ML (1MG/ML) PREMIX INFUSION
1.0000 mg/h | INTRAVENOUS | Status: DC
  Administered 2020-01-25 – 2020-01-26 (×2): 2 mg/h via INTRAVENOUS
  Filled 2020-01-25 (×2): qty 50

## 2020-01-25 MED ORDER — ETOMIDATE 2 MG/ML IV SOLN
INTRAVENOUS | Status: AC
  Administered 2020-01-25: 20 mg
  Filled 2020-01-25: qty 20

## 2020-01-25 NOTE — Progress Notes (Signed)
Patients husband has stayed through the night and patient has been able to remain slightly more calm. Respirations will get as low as 30 and as high as 55. Patient was able to rest well for a couple hours after Seroquel was given. She is able to maintain saturations above 90% consistently if anxiety is kept under control. She remarked several times through the night that pain is not the problem and she does not feel the need for the PCA pump. RN requested anxiety medicine for patient at 6:30 to ensure she remains calm and decreases her work of breathing. RN awaiting orders.

## 2020-01-25 NOTE — Progress Notes (Signed)
eLink Physician-Brief Progress Note Patient Name: Darlene Castillo DOB: [birth year] MRN: [id number]   Date of Service  01/25/2020  HPI/Events of Note  Sinus Tachycardia - HR = 128.   eICU Interventions  Will order: 1. Bolus with 0.9 NaCl 1 liter IV over 1 hour now.      Intervention Category Major Interventions: Arrhythmia - evaluation and management  [name] 01/25/2020, 10:02 PM

## 2020-01-25 NOTE — Progress Notes (Signed)
[facility] Progress Note  7 Days Post-Op  Subjective: CC-  Patient in significant respiratory distress this morning, about to get reintubated.  After extubation yesterday she vomited once. CT a/p shows diffuse inflammatory wall thickening throughout the colon with diffuse fecal material identified, free fluid in the pelvis but no definitive abscess.  Objective: Vital signs in last 24 hours: Temp:  [97.5 F (36.4 C)-98.5 F (36.9 C)] 98 F (36.7 C) (05/11 2356) Pulse Rate:  [81-144] 103 (05/12 0700) Resp:  [28-45] 34 (05/12 0700) BP: (94-184)/(49-118) 120/78 (05/12 0700) SpO2:  [85 %-99 %] 94 % (05/12 0700) FiO2 (%):  [30 %-100 %] 100 % (05/12 0415) Last BM Date: (PTA)  Intake/Output from previous day: 05/11 0701 - 05/12 0700 In: 2040.7 [I.V.:1895.2; IV Piggyback:145.5] Out: 2400 [Urine:2325; Drains:50; Stool:25] Intake/Output this shift: No intake/output data recorded.  PE: Gen:  Lethargic Card:  RRR Pulm: in respiratory distress on HFNC Abd: distended and somewhat firm, hypoactive BS, vac to midline incision with good seal, ostomy viable with scant liquid stool and air in pouch  Lab Results:  Recent Labs    01/23/20 0345  WBC 11.3*  HGB 9.0*  HCT 26.8*  PLT 181   BMET Recent Labs    01/24/20 0350 01/25/20 0420  NA 140 140  K 4.0 5.6*  CL 107 105  CO2 26 27  GLUCOSE 255* 560*  BUN 21* 20  CREATININE 0.62 0.63  CALCIUM 8.4* 8.1*   PT/INR No results for input(s): LABPROT, INR in the last 72 hours. CMP     Component Value Date/Time   NA 140 01/25/2020 0420   K 5.6 (H) 01/25/2020 0420   CL 105 01/25/2020 0420   CO2 27 01/25/2020 0420   GLUCOSE 560 (HH) 01/25/2020 0420   BUN 20 01/25/2020 0420   CREATININE 0.63 01/25/2020 0420   CALCIUM 8.1 (L) 01/25/2020 0420   PROT 5.7 (L) 01/25/2020 0420   ALBUMIN 1.9 (L) 01/25/2020 0420   AST 39 01/25/2020 0420   ALT 28 01/25/2020 0420   ALKPHOS 170 (H) 01/25/2020 0420   BILITOT 0.5 01/25/2020  0420   GFRNONAA >60 01/25/2020 0420   GFRAA >60 01/25/2020 0420   Lipase     Component Value Date/Time   LIPASE 19 01/18/2020 0525       Studies/Results: CT ABDOMEN PELVIS W CONTRAST  Result Date: 01/24/2020 CLINICAL DATA:  No history of recent sigmoid colon perforation with subsequent left-sided colostomy and Hartmann's pouch creation, postoperative fever evaluate for possible abscess EXAM: CT ABDOMEN AND PELVIS WITH CONTRAST TECHNIQUE: Multidetector CT imaging of the abdomen and pelvis was performed using the standard protocol following bolus administration of intravenous contrast. CONTRAST:  OMNIPAQUE IOHEXOL 300 MG/ML  SOLN COMPARISON:  01/18/2011 FINDINGS: Lower chest: Lung bases demonstrate bilateral lower lobe consolidation with associated effusions. This may be related to postoperative atelectasis although superimposed pneumonia cannot be totally excluded. Hepatobiliary: Liver demonstrates diffuse fatty infiltration. The gallbladder is decompressed with some mild pericholecystic fluid which is likely related to the recent surgery and perforation. Some additional fluid is noted along the tip of the liver inferiorly. Pancreas: Unremarkable. No pancreatic ductal dilatation or surrounding inflammatory changes. Spleen: Spleen is within normal limits. Adrenals/Urinary Tract: Adrenal glands are unremarkable. Kidneys demonstrate a normal enhancement pattern. Stable large right renal cyst is again noted. No renal calculi or obstructive changes are noted. The bladder is decompressed by Foley catheter. Stomach/Bowel: Postsurgical changes are noted in the colon with a Hartmann's pouch deep  within the pelvis. 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 No complicating factors are seen. There remains a considerable amount of fecal material throughout the colon with diffuse wall thickening similar to that noted on prior exam. No definitive colonic perforation is noted at  this time. Small bowel is well enhanced and within normal limits. Gastric catheter is noted in place. The stomach is decompressed. Vascular/Lymphatic: Aortic atherosclerosis. No enlarged abdominal or pelvic lymph nodes. Reproductive: Uterus is stable in appearance. Other: Free fluid is noted within the pelvis both anterior and posterior to the uterus as well as extending superiorly surrounding the loops of colon and small bowel. This is likely related to the recent surgery. No definitive enhancing fluid collection to suggest abscess formation is noted at this time. Musculoskeletal: Postsurgical wound is noted in the anterior abdomen healing by secondary intention. Degenerative changes of lumbar spine are seen. The nodular changes in the right breast are less well visualized on today's exam. IMPRESSION: Postsurgical changes consistent with the given clinical history. No colostomy complications are seen. There remains diffuse inflammatory wall thickening throughout the colon with diffuse fecal material identified. No findings of perforation are noted. Considerable free fluid within the abdomen and pelvis likely related to the recent surgery. No definitive abscess is noted at this time. Continued follow-up is recommended. Fluid surrounding the gallbladder which is predominately decompressed likely related to the free fluid within the abdomen and reactive in nature. No definitive changes of cholecystitis are seen. Bilateral lower lobe consolidation with associated effusions. This may be related to the postoperative state although superimposed pneumonia could not be totally excluded. This likely contributes to the underlying fevers. Electronically Signed   By: Inez Catalina M.D.   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 Brantley Stage - POD#7 - surgical path: Perforation with acute inflammation and acute serositis, no evidence of malignancy - wound vac MWF - WOC RN following for new colostomy and vac - continue IV zosyn >/= 10 days for peritonitis - CT 5/11 showed diffuse inflammatory wall thickening throughout the colon with diffuse fecal material identified, free fluid in the pelvis but no definitive abscess  ID -zosyn 5/5>>day#7 FEN -NPO/NGT to LIWS, TPN VTE -SCDs, lovenox Foley -continue for strict I&O  Plan: CCM reintubating this morning. D/c tube feedings and keep NG tube to LIWS. Continue full TPN. CT without definitive abscess, will check U/a, Ucx, and Bcx's in light of fever.    LOS: 7 days    [facility] 01/25/2020, 8:04 AM Please see Amion for pager number during day hours 7:00am-4:30pm

## 2020-01-25 NOTE — Consult Note (Addendum)
WOC Nurse ostomy follow up Pt is intubated and not awake, no family members at the bedside.   Current ostomy pouch is intact with good seal and small amt brown liquid stool, stoma is red and viable. Supplies at the bedside for staff nurse use Enrolled patient in [employer] Discharge program: Yes WOC team will begin teaching sessions when stable and out of ICU.   Pt medicated for pain; changed Vac dressing to midline full thickness abd wound. Surgical PA at the bedside to assess wound appearance. Applied one piece black foam to cont suction. Wound is beefy red with yellow adipose tissue in layers.  Applied barrier ring at lower edge of abd skin fold to attempt to maintain a seal.  Pt tolerated with mod amt discomfort.   WOC will plan to change Q M/W/F. Cammie Mcgee MSN, RN, CWOCN, [place], CNS [phone]

## 2020-01-25 NOTE — Progress Notes (Signed)
Lab notified that ABG being sent for analysis. 

## 2020-01-25 NOTE — Progress Notes (Signed)
eLink Physician-Brief Progress Note Patient Name: Darlene Castillo DOB: 10-17-1960 MRN: [id number]   Date of Service  01/25/2020  HPI/Events of Note  Anxiety  eICU Interventions  Will order: 1. Versed 0.5 mg IV X 1 now.      Intervention Category Major Interventions: Delirium, psychosis, severe agitation - evaluation and management  [name] 01/25/2020, 6:39 AM

## 2020-01-25 NOTE — Progress Notes (Addendum)
Came to see patient at request of Dr Arsenio Loader.  Patient markedly anxious, on Facemask and Hartley O2.  RR in 40s.  Patient did vomit while I was in the room, we will hold tube feeds and place ng to suction. The patient very clearly expressed to me in the presence of nursing that she did not want to be reintubated even if it means that she will die.  Apparently she has told Logan, her nurse tonight, the same on three different occasions this shift.  At her request will make her a DNI.  Discussed the situation with patient's husband and urged him to come in to speak with her in regards to this decision.  He says he will come in. I suspect that some of this anxiety may be related to benzo withdrawal.    Over 35 minutes spent in chart review, patient evaluation and critical care planning.

## 2020-01-25 NOTE — Procedures (Signed)
Intubation Procedure Note Darlene Castillo [id number] 28-Aug-1961  Procedure: Intubation Indications: Respiratory insufficiency  Procedure Details Consent: Risks of procedure as well as the alternatives and risks of each were explained to the (patient/caregiver).  Consent for procedure obtained. Time Out: Verified patient identification, verified procedure, site/side was marked, verified correct patient position, special equipment/implants available, medications/allergies/relevent history reviewed, required imaging and test results available.  Performed  Maximum sterile technique was used including antiseptics, gloves, gown, hand hygiene and mask.  MAC  Grade 1 vuiew . Easy intubation  Evaluation Hemodynamic Status: BP stable throughout; O2 sats: stable throughout Patient's Current Condition: stable Complications: No apparent complications Patient did tolerate procedure well. Chest X-ray ordered to verify placement.  CXR: pending.   Brand Males 01/25/2020

## 2020-01-25 NOTE — TOC Progression Note (Signed)
Transition of Care [facility]) - Progression Note    Patient Details  Name: Darlene Castillo MRN: [id number] Date of Birth: [birth year]  [facility] CM/SW Contact  Golda Acre, RN Phone Number: 01/25/2020, 8:08 AM  Clinical Narrative:    Extubated on [phone] to nrbm at 15l/min, awake, husband is aware of condition and decision by patient to DO NOT INTUBATE.  Iv zosyn and iv tpn ongoing.   Expected Discharge Plan: Home/Self Care Barriers to Discharge: Continued Medical Work up  Expected Discharge Plan and Services Expected Discharge Plan: Home/Self Care       Living arrangements for the past 2 months: Single Family Home                                       Social Determinants of Health (SDOH) Interventions    Readmission Risk Interventions No flowsheet data found.

## 2020-01-25 NOTE — Progress Notes (Signed)
PHARMACY - TOTAL PARENTERAL NUTRITION CONSULT NOTE   Indication: Prolonged ileus  Patient Measurements: Height: 5\' 1"  (154.9 cm) Weight: 67 kg (147 lb 11.3 oz) IBW/kg (Calculated) : 47.8   Body mass index is 27.91 kg/m. Usual Weight: 67 kg  Assessment: 59 yo F s/p Sigmoid colon perforation secondary to chronic constipation and ulceration with feculent peritonitis and gross contamination of abdominal cavity with stool balls  -S/p Exploratory laparotomy with partial colectomy and end colostomy 5/5 Dr. 46 5/6 PICC placed and started TPN for nutrition support; expect prolonged ileus  Glucose / Insulin: prediabetes, A1c 6.3, CBG improved, most recent 141 while TF off and dextrose concentration in TPN decreased yesterday, serum glucose 181 (41 units SSI/24hr) Electrolytes: phos 4.5, Mag 2.0 (goal >=2) ,  K 4.1 (goal >=4); CoCa 10.08 Renal: Scr WNL, 2325cc UOP over past 24hr LFTs / TGs: WNL; Trig 27> 331> 434 > 270 > 210, propofol dc'd 5/7 and no lipids in TPN Prealbumin / albumin: prealbumin 9.7 5/6 > now < 5 reflects inflammation of post-op stress and critical illness, admit alb 4.5 WNL, BMI 27.9, well-nourished PTA, poor PO intake for 5 days PTA Intake / Output; MIVF: IVF dc'd Surgeries / Procedures:   -S/p Exploratory laparotomy with partial colectomy and end colostomy 5/5 Dr. 7/7 GI Imaging: CT 5/12 -  diffuse inflammatory wall thickening throughout the colon with diffuse fecal material identified, free fluid in the pelvis but no definitive abscess.  Central access: PICC 5/6 for TPN TPN start date: 01/19/2020  Nutritional Goals (per RD recommendation updated on 01/23/2020): kCal: 1379, Protein: 114-130, Fluid: >= 1.3 L/day  Goal TPN rate is 75 mL/hr (provides 117 g of protein and ~1570 kcals per day) without lipids  Current Nutrition:  TPN at 75 ml/hr TF now off, NGT at [facility]  Plan:  At 18:00, Adjust TPN rate to new goal of 80 ml/hr to provide 115g protein, total kcal  1401. Add SMOF lipid emulsion today, has been held for first 7 days for critically ill patients per ASPEN guidelines Electrolytes in TPN: 25mEq/L of Na, 48mEq/L of K, 21mEq/L of Ca, 33mEq/L of Mg, and decrease to 22mmol/L of Phos . Cl:Ac ratio 1:1 Add standard MVI and trace elements to TPN Continue q4h SSI, adjust to standard scale   Continue pepcid 40 mg/day in TPN Monitor TPN labs on Mon/Thurs, check CMET/Mg/Phos in AM  9m, PharmD, BCPS Pharmacy: [phone] 01/25/2020 9:12 AM

## 2020-01-25 NOTE — Progress Notes (Addendum)
NAME:  Darlene Castillo, MRN:  [id number], DOB:  Dec 23, 1960, LOS: 7 ADMISSION DATE:  01/18/2020, CONSULTATION DATE:  01/18/20 REFERRING MD:  Harriette Bouillon CHIEF COMPLAINT:  VDRF, septic shock  Brief History   Peritonitis 2/2 sigmoid colon perforation s/p ex-lap 5/5 with resection and end colostomy.  History of present illness   Darlene Castillo is a 48 yobf smoker  who presented early am 5/5  with abdominal pain, constipation, one episode of vomiting and was found to have a perforated sigmoid colon and peritonitis.  She went to the OR for ex lap and return to the ICU intubated.  Past Medical History  Chronic opiate use Tobacco abuse DM HTN  Significant Hospital Events   5/5 ex lap with partial colectomy and end colostomy for perf:  neg path    Consults:  [facility] PCCM  Procedures:  5/5 CVL RIJ 5/5 A-line 5/7  5/5 ET 5/6 PIC L brachial   Significant Diagnostic Tests:  CT abdomen pelvis with contrast 01/17/2020-scattered groundglass opacities in the lower lungs bilaterally, no focal opacities.  Abdominal free air.  Multiple dilated loops of bowel with stool.  Large R renal cyst. Path 5/5 >>> - Perforation with acute inflammation and acute serositis. No evidence of malignancy.    Micro Data:  Covid negative xxxxxxxxxx Tracheal aspirate 01/25/20 Urine analysis 01/25/2020 Blood culture 01/25/2020   Antimicrobials:  Zosyn 5/5  >>    Med profile   INfusion . piperacillin-tazobactam (ZOSYN)  IV 3.375 g (01/25/20 0528)  . TPN ADULT (ION) 60 mL/hr at 01/25/20 0400   Scheudled . albuterol  2.5 mg Nebulization Q6H  . Chlorhexidine Gluconate Cloth  6 each Topical Daily  . cloNIDine  0.1 mg Per Tube Q6H  . enoxaparin (LOVENOX) injection  40 mg Subcutaneous Q24H  . etomidate      . fentaNYL      . insulin aspart  3-9 Units Subcutaneous Q4H  . mouth rinse  15 mL Mouth Rinse BID  . methadone  7.5 mg Intravenous Q6H  . midazolam      . nicotine  7 mg Transdermal Daily  . phenylephrine       . QUEtiapine  25 mg Per Tube QHS  . rocuronium bromide      . sodium chloride flush  10-40 mL Intracatheter Q12H  . sterile water (preservative free)      . succinylcholine      . vecuronium       PRN acetaminophen (TYLENOL) oral liquid 160 mg/5 mL, olopatadine, sodium chloride flush     Interim history/subjective:    01/25/2020: Extubated yesterday evening but in significant respiratory distress and fatigue this morning.  Overnight decided on DO NOT INTUBATE status but husband questioning her capacity this morning.  In conversation with the patient it appears she does not have capacity.  She is not able to comprehend my conversation about intubation.  Husband reports patient is mostly him and has intense desire to live.  He does not feel that patient's decision on no intubation is consistent with her values.  He also states it is not in her best interest.    Had a temperature and per CCS has CT scan abdomen did  not find any abscess.  Panculture required.  Labs probably not accurate - drawn from near TPN  - likely per pharmacist. Needs repeat lab draw     Objective   Blood pressure 120/78, pulse (!) 103, temperature 98 F (36.7 C), temperature source Axillary, resp.  rate (!) 34, height 5\' 1"  (1.549 m), weight 67 kg, last menstrual period 05/23/2013, SpO2 94 %.    Vent Mode: PRVC FiO2 (%):  [30 %-100 %] 100 % Set Rate:  [26 bmp] 26 bmp Vt Set:  [330 mL] 330 mL PEEP:  [5 cmH20] 5 cmH20 Plateau Pressure:  [22 cmH20-42 cmH20] 28 cmH20   Intake/Output Summary (Last 24 hours) at 01/25/2020 0750 Last data filed at 01/25/2020 0649 Gross per 24 hour  Intake 2040.71 ml  Output 2400 ml  Net -359.29 ml   Filed Weights   01/18/20 1300  Weight: 67 kg      General Appearance:  Looks criticall ill . On preced33x gtt Head:  Normocephalic, without obvious abnormality, atraumatic Eyes:  PERRL - yes, conjunctiva/corneas - muddy     Ears:  Normal external ear canals, both ears Nose:  G  tube - no Throat:  ETT TUBE - no , OG tube - no. FAce mask o2 on and [place] + Neck:  Supple,  No enlargement/tenderness/nodules Lungs: Respiratory 40s and paradoxical.  On NRB Using accessory muscles.  Not able to talk sentences.  Wheezing Heart:  S1 and S2 normal, no murmur, CVP - x.  Pressors - no Abdomen:  S/p P lapl . Distended. Wound vac + . No drain. TF n hold Genitalia / Rectal:  Not done Extremities:  Extremities- intact Skin:  ntact in exposed areas . Sacral area - not examined Neurologic:  Sedation - precedex gtt -> RASS - +1 . Moves all 4s - weak. CAM-ICU - not oriented fully. No capcacity but does follow commands    Resolved problems   Circulatory/septic shock    Assessment & Plan:   Acute hypoxic respiratory failure requiring mechanical ventilation.   01/25/2020 - > meets reintubation criteria   Plan  - ITinbuate . PRVC - VAP protocol   Chronic pain needs on oral methadone program Acute encephalopathy  01/25/2020: New and mild.  Probably due to sepsis and respiratory fatigue.  On scheduled IV methadone  Plan -Requires heavy sedation post intubation -RASS sedation score -2/-34 01/25/2020 -Dilaudid nfusion and versed infusion  - Dc precedex ex infusion -Stop IV scheduled methadone [can restart later when better] -Continue scheduled Seroquel - nicotine patch -Monitor QTC   Peritonitis 2/2 sigmoid colon perforation from chronic constipation - s/p ex lap with sigmoid resection and colostomy.  01/25/2020 -  Having new fever  - Per [facility]. - Continue empiric zosyn. - pan culture 01/25/20 -Check sepsis biomarkers 01/25/2020  Malnutrition. Expected prolonged ileus post op. - Continue TPN. - Hold TF on trickle     Rest per primary team.   Best practice:  Diet: NPO.  TPN for now. TF Pain/Anxiety/Delirium protocol (if indicated): see above VAP protocol (if indicated): In place. DVT prophylaxis: SCDs and lovenox. GI prophylaxis: pepcid. Glucose control:  SSI. Mobility: bedrest. Code Status: full. Family Communication:husband updated at bedside Disposition: ICU.     ATTESTATION & SIGNATURE   The patient Darlene Castillo is critically ill with multiple organ systems failure and requires high complexity decision making for assessment and support, frequent evaluation and titration of therapies, application of advanced monitoring technologies and extensive interpretation of multiple databases.   Critical Care Time devoted to patient care services described in this note is  35  Minutes. This time reflects time of care of this signee Dr Danna Hefty. This critical care time does not reflect procedure time, or teaching time or supervisory time of PA/NP/Med student/Med Resident  etc but could involve care discussion time     Dr. Kalman Shan, M.D., [facility].C.P Pulmonary and Critical Care Medicine Staff Physician [facility] Pager: [phone], If no answer or between  15:00h - 7:00h: call [phone]  01/25/2020 7:51 AM   LABS    PULMONARY Recent Labs  Lab 01/18/20 1101 01/18/20 1101 01/18/20 1141 01/18/20 1330 01/18/20 1552 01/19/20 0115 01/19/20 0425 01/20/20 0729 01/21/20 1300  PHART 7.140*   < > 7.282*   < > 7.328* 7.364 7.390 7.431 7.433  PCO2ART 54.2*   < > 36.8   < > 33.4 30.6* 29.4* 30.0* 38.3  PO2ART 385*   < > 182*   < > 125* 80.8* 98.3 109* 67.1*  HCO3 18.5*   < > 17.4*   < > 17.0* 16.9* 17.4* 19.6* 25.1  TCO2 20*  --  19*  --   --   --   --   --   --   O2SAT 100.0   < > 99.0   < > 98.7 95.4 97.9 98.1 92.3   < > = values in this interval not displayed.    CBC Recent Labs  Lab 01/20/20 0500 01/22/20 0445 01/23/20 0345  HGB 9.9* 8.9* 9.0*  HCT 29.7* 26.4* 26.8*  WBC 7.6 9.3 11.3*  PLT 142* 148* 181    COAGULATION No results for input(s): INR in the last 168 hours.  CARDIAC  No results for input(s): TROPONINI in the last 168 hours. No results for  input(s): PROBNP in the last 168 hours.   CHEMISTRY Recent Labs  Lab 01/21/20 0430 01/21/20 0430 01/22/20 0445 01/22/20 0445 01/23/20 0345 01/23/20 0345 01/24/20 0350 01/25/20 0420  NA 140  --  138  --  139  --  140 140  K 3.9   < > 3.7   < > 4.2   < > 4.0 5.6*  CL 110  --  104  --  106  --  107 105  CO2 24  --  24  --  24  --  26 27  GLUCOSE 127*  --  128*  --  166*  --  255* 560*  BUN 16  --  17  --  18  --  21* 20  CREATININE 0.75  --  0.72  --  0.64  --  0.62 0.63  CALCIUM 8.0*  --  8.0*  --  8.0*  --  8.4* 8.1*  MG 2.0  --  1.7  --  1.8  --  2.1 2.2  PHOS 2.2*  --  4.3  --  4.4  --  3.5 5.4*   < > = values in this interval not displayed.   Estimated Creatinine Clearance: 66.3 mL/min (by C-G formula based on SCr of 0.63 mg/dL).   LIVER Recent Labs  Lab 01/21/20 0430 01/22/20 0445 01/23/20 0345 01/24/20 0350 01/25/20 0420  AST 54* 59* 43* 37 39  ALT 16 19 18 23 28   ALKPHOS 47 69 81 134* 170*  BILITOT 0.7 0.5 0.3 0.6 0.5  PROT 5.0* 5.2* 4.9* 5.6* 5.7*  ALBUMIN 2.2* 1.9* 1.8* 1.8* 1.9*     INFECTIOUS Recent Labs  Lab 01/18/20 1630 01/19/20 0835 01/19/20 1300 01/23/20 0345  LATICACIDVEN 6.6* 2.8* 2.1*  --   PROCALCITON  --   --   --  14.12     ENDOCRINE CBG (last 3)  Recent Labs    01/24/20  2002 01/24/20 2329 01/25/20 0431  GLUCAP 125* 174* 168*         IMAGING x48h  - image(s) personally visualized  -   highlighted in bold CT ABDOMEN PELVIS W CONTRAST  Result Date: 01/24/2020 CLINICAL DATA:  No history of recent sigmoid colon perforation with subsequent left-sided colostomy and Hartmann's pouch creation, postoperative fever evaluate for possible abscess EXAM: CT ABDOMEN AND PELVIS WITH CONTRAST TECHNIQUE: Multidetector CT imaging of the abdomen and pelvis was performed using the standard protocol following bolus administration of intravenous contrast. CONTRAST:  163mL OMNIPAQUE IOHEXOL 300 MG/ML  SOLN COMPARISON:  01/18/2011 FINDINGS: Lower  chest: Lung bases demonstrate bilateral lower lobe consolidation with associated effusions. This may be related to postoperative atelectasis although superimposed pneumonia cannot be totally excluded. Hepatobiliary: Liver demonstrates diffuse fatty infiltration. The gallbladder is decompressed with some mild pericholecystic fluid which is likely related to the recent surgery and perforation. Some additional fluid is noted along the tip of the liver inferiorly. Pancreas: Unremarkable. No pancreatic ductal dilatation or surrounding inflammatory changes. Spleen: Spleen is within normal limits. Adrenals/Urinary Tract: Adrenal glands are unremarkable. Kidneys demonstrate a normal enhancement pattern. Stable large right renal cyst is again noted. No renal calculi or obstructive changes are noted. The bladder is decompressed by Foley catheter. Stomach/Bowel: Postsurgical changes are noted in the colon with a Hartmann's pouch deep within the pelvis. 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 No complicating factors are seen. There remains a considerable amount of fecal material throughout the colon with diffuse wall thickening similar to that noted on prior exam. No definitive colonic perforation is noted at this time. Small bowel is well enhanced and within normal limits. Gastric catheter is noted in place. The stomach is decompressed. Vascular/Lymphatic: Aortic atherosclerosis. No enlarged abdominal or pelvic lymph nodes. Reproductive: Uterus is stable in appearance. Other: Free fluid is noted within the pelvis both anterior and posterior to the uterus as well as extending superiorly surrounding the loops of colon and small bowel. This is likely related to the recent surgery. No definitive enhancing fluid collection to suggest abscess formation is noted at this time. Musculoskeletal: Postsurgical wound is noted in the anterior abdomen healing by secondary intention. Degenerative changes of  lumbar spine are seen. The nodular changes in the right breast are less well visualized on today's exam. IMPRESSION: Postsurgical changes consistent with the given clinical history. No colostomy complications are seen. There remains diffuse inflammatory wall thickening throughout the colon with diffuse fecal material identified. No findings of perforation are noted. Considerable free fluid within the abdomen and pelvis likely related to the recent surgery. No definitive abscess is noted at this time. Continued follow-up is recommended. Fluid surrounding the gallbladder which is predominately decompressed likely related to the free fluid within the abdomen and reactive in nature. No definitive changes of cholecystitis are seen. Bilateral lower lobe consolidation with associated effusions. This may be related to the postoperative state although superimposed pneumonia could not be totally excluded. This likely contributes to the underlying fevers. Electronically Signed   By: Inez Catalina M.D.   On: 01/24/2020 17:53

## 2020-01-25 NOTE — Progress Notes (Signed)
Ordered sputum sample collected and sent for analysis. 

## 2020-01-26 ENCOUNTER — Inpatient Hospital Stay (HOSPITAL_COMMUNITY): Payer: BLUE CROSS/BLUE SHIELD

## 2020-01-26 DIAGNOSIS — R509 Fever, unspecified: Secondary | ICD-10-CM

## 2020-01-26 DIAGNOSIS — R52 Pain, unspecified: Secondary | ICD-10-CM

## 2020-01-26 LAB — GLUCOSE, CAPILLARY
Glucose-Capillary: 167 mg/dL — ABNORMAL HIGH (ref 70–99)
Glucose-Capillary: 167 mg/dL — ABNORMAL HIGH (ref 70–99)
Glucose-Capillary: 161 mg/dL — ABNORMAL HIGH (ref 70–99)
Glucose-Capillary: 183 mg/dL — ABNORMAL HIGH (ref 70–99)
Glucose-Capillary: 174 mg/dL — ABNORMAL HIGH (ref 70–99)
Glucose-Capillary: 168 mg/dL — ABNORMAL HIGH (ref 70–99)

## 2020-01-26 LAB — BASIC METABOLIC PANEL
Anion gap: 7 (ref 5–15)
BUN: 19 mg/dL (ref 6–20)
CO2: 27 mmol/L (ref 22–32)
Calcium: 7.8 mg/dL — ABNORMAL LOW (ref 8.9–10.3)
Chloride: 105 mmol/L (ref 98–111)
Creatinine, Ser: 0.65 mg/dL (ref 0.44–1.00)
GFR calc Af Amer: >60 mL/min (ref >60)
GFR calc non Af Amer: >60 mL/min (ref >60)
Glucose, Bld: 180 mg/dL — ABNORMAL HIGH (ref 70–99)
Potassium: 3.9 mmol/L (ref 3.5–5.1)
Sodium: 139 mmol/L (ref 135–145)

## 2020-01-26 LAB — COMPREHENSIVE METABOLIC PANEL
ALT: 26 U/L (ref 0–44)
AST: 33 U/L (ref 15–41)
Albumin: 1.9 g/dL — ABNORMAL LOW (ref 3.5–5.0)
Alkaline Phosphatase: 203 U/L — ABNORMAL HIGH (ref 38–126)
Anion gap: 11 (ref 5–15)
BUN: 17 mg/dL (ref 6–20)
CO2: 27 mmol/L (ref 22–32)
Calcium: 8.1 mg/dL — ABNORMAL LOW (ref 8.9–10.3)
Chloride: 101 mmol/L (ref 98–111)
Creatinine, Ser: 0.55 mg/dL (ref 0.44–1.00)
GFR calc Af Amer: >60 mL/min (ref >60)
GFR calc non Af Amer: >60 mL/min (ref >60)
Glucose, Bld: 183 mg/dL — ABNORMAL HIGH (ref 70–99)
Potassium: 4.1 mmol/L (ref 3.5–5.1)
Sodium: 139 mmol/L (ref 135–145)
Total Bilirubin: 0.4 mg/dL (ref 0.3–1.2)
Total Protein: 6 g/dL — ABNORMAL LOW (ref 6.5–8.1)

## 2020-01-26 LAB — PHOSPHORUS: Phosphorus: 3.1 mg/dL (ref 2.5–4.6)

## 2020-01-26 LAB — HEPARIN LEVEL (UNFRACTIONATED): Heparin Unfractionated: 0.17 IU/mL — ABNORMAL LOW (ref 0.30–0.70)

## 2020-01-26 LAB — URINE CULTURE: Culture: NO GROWTH

## 2020-01-26 LAB — PROCALCITONIN: Procalcitonin: 3.4 ng/mL

## 2020-01-26 LAB — MAGNESIUM
Magnesium: 1.9 mg/dL (ref 1.7–2.4)
Magnesium: 1.8 mg/dL (ref 1.7–2.4)

## 2020-01-26 MED ORDER — LIP MEDEX EX OINT
1.0000 "application " | TOPICAL_OINTMENT | CUTANEOUS | Status: DC | PRN
  Administered 2020-01-27: 1 via TOPICAL
  Filled 2020-01-26: qty 7

## 2020-01-26 MED ORDER — POLYETHYLENE GLYCOL 3350 17 G PO PACK
17.0000 g | PACK | Freq: Every day | ORAL | Status: DC
  Administered 2020-01-26 – 2020-01-30 (×5): 17 g
  Filled 2020-01-26 (×5): qty 1

## 2020-01-26 MED ORDER — HEPARIN (PORCINE) 25000 UT/250ML-% IV SOLN
1200.0000 [IU]/h | INTRAVENOUS | Status: DC
  Administered 2020-01-26: 1000 [IU]/h via INTRAVENOUS
  Filled 2020-01-26: qty 250

## 2020-01-26 MED ORDER — TRAVASOL 10 % IV SOLN
INTRAVENOUS | Status: AC
  Filled 2020-01-26: qty 1152

## 2020-01-26 MED ORDER — METOPROLOL TARTRATE 5 MG/5ML IV SOLN
2.5000 mg | INTRAVENOUS | Status: DC | PRN
  Administered 2020-01-26 – 2020-02-15 (×13): 2.5 mg via INTRAVENOUS
  Filled 2020-01-26 (×12): qty 5

## 2020-01-26 MED ORDER — DEXMEDETOMIDINE HCL IN NACL 200 MCG/50ML IV SOLN
0.4000 ug/kg/h | INTRAVENOUS | Status: DC
  Administered 2020-01-26: 0.8 ug/kg/h via INTRAVENOUS
  Administered 2020-01-26 (×2): 0.4 ug/kg/h via INTRAVENOUS
  Administered 2020-01-26: 0.5 ug/kg/h via INTRAVENOUS
  Administered 2020-01-27: 1.2 ug/kg/h via INTRAVENOUS
  Administered 2020-01-27: 1.1 ug/kg/h via INTRAVENOUS
  Administered 2020-01-27 – 2020-01-28 (×5): 1.2 ug/kg/h via INTRAVENOUS
  Administered 2020-01-28: 0.9 ug/kg/h via INTRAVENOUS
  Administered 2020-01-28: 1.2 ug/kg/h via INTRAVENOUS
  Administered 2020-01-28: 1 ug/kg/h via INTRAVENOUS
  Administered 2020-01-28 – 2020-01-29 (×6): 1.2 ug/kg/h via INTRAVENOUS
  Filled 2020-01-26 (×6): qty 50
  Filled 2020-01-26: qty 100
  Filled 2020-01-26 (×6): qty 50
  Filled 2020-01-26: qty 100
  Filled 2020-01-26: qty 50
  Filled 2020-01-26: qty 100
  Filled 2020-01-26 (×4): qty 50
  Filled 2020-01-26: qty 100
  Filled 2020-01-26: qty 50

## 2020-01-26 MED ORDER — MAGNESIUM SULFATE 2 GM/50ML IV SOLN
2.0000 g | Freq: Once | INTRAVENOUS | Status: AC
  Administered 2020-01-26: 2 g via INTRAVENOUS
  Filled 2020-01-26: qty 50

## 2020-01-26 MED ORDER — VITAL HIGH PROTEIN PO LIQD
1000.0000 mL | ORAL | Status: DC
  Administered 2020-01-26 – 2020-01-27 (×2): 1000 mL

## 2020-01-26 NOTE — Progress Notes (Signed)
eLink Physician-Brief Progress Note Patient Name: Darlene Castillo DOB: Jan 06, 1961 MRN: [id number]   Date of Service  01/26/2020  HPI/Events of Note  Nursing concerned about sinus tachycardia with frequent PVC's. Last K+ = 4.1 and last Mg++ = 2.0 both this AM.  eICU Interventions  Will order: 1.Monitor CVP now and Q 4 hours.  2. BMP and Mg++ level STAT.      Intervention Category Major Interventions: Arrhythmia - evaluation and management  Olman Yono Eugene 01/26/2020, 12:27 AM

## 2020-01-26 NOTE — Progress Notes (Signed)
Bilateral upper and lower extremity venous duplexes have been completed. Preliminary results can be found in CV Proc through chart review.  Results were given to the patient's nurse, Clovis Riley.  01/26/20 11:01 AM Olen Cordial RVT

## 2020-01-26 NOTE — TOC Progression Note (Addendum)
Transition of Care [facility]) - Progression Note    Patient Details  Name: Darlene Castillo MRN: [id number] Date of Birth: [birth year]  [facility] CM/SW Contact  Golda Acre, RN Phone Number: 01/26/2020, 8:03 AM  Clinical Narrative:    Reintubated on 051221/Remains on vent at 100%fi02, Iv dilaudid drip,iv eraxis, iv zosyn, iv tpn and iv versed for sedation.ostomy is functional at this point.   Expected Discharge Plan: Home/Self Care Barriers to Discharge: Continued Medical Work up  Expected Discharge Plan and Services Expected Discharge Plan: Home/Self Care       Living arrangements for the past 2 months: Single Family Home                                       Social Determinants of Health (SDOH) Interventions    Readmission Risk Interventions No flowsheet data found.

## 2020-01-26 NOTE — Progress Notes (Signed)
PHARMACY - TOTAL PARENTERAL NUTRITION CONSULT NOTE   Indication: Prolonged ileus  Patient Measurements: Height: 5\' 1"  (154.9 cm) Weight: 67 kg (147 lb 11.3 oz) IBW/kg (Calculated) : 47.8   Body mass index is 27.91 kg/m. Usual Weight: 67 kg  Assessment: 59 yo F s/p Sigmoid colon perforation secondary to chronic constipation and ulceration with feculent peritonitis and gross contamination of abdominal cavity with stool balls  -S/p Exploratory laparotomy with partial colectomy and end colostomy 5/5 Dr. 46 5/6 PICC placed and started TPN for nutrition support; expect prolonged ileus  Glucose / Insulin: prediabetes, A1c 6.3, CBG improved 147 - 167, 19 units insulin Electrolytes: phos 4.5 > 3.1, Mag 1.8 (goal >=2) ,  K 4.1 (goal >=4); CoCa 9.7 Renal: Scr WNL, 4.6L charted UOP over past 24hr LFTs / TGs: WNL; Trig 27> 331> 434 > 270 > 210, propofol dc'd 5/7, Lipids now in formula Prealbumin / albumin: prealbumin 9.7 5/6 > now < 5 reflects inflammation of post-op stress and critical illness, admit alb 4.5 WNL, BMI 27.9, well-nourished PTA, poor PO intake for 5 days PTA Intake / Output; MIVF: IVF dc'd Surgeries / Procedures:   -S/p Exploratory laparotomy with partial colectomy and end colostomy 5/5 Dr. 7/7 GI Imaging: CT 5/12 -  diffuse inflammatory wall thickening throughout the colon with diffuse fecal material identified, free fluid in the pelvis but no definitive abscess.  Central access: PICC 5/6 for TPN TPN start date: 01/19/2020  Nutritional Goals (per RD recommendation updated on 01/23/2020): kCal: 1379, Protein: 114-130, Fluid: >= 1.3 L/day  Goal TPN rate is 80 mL/hr (provides 115 g of protein and ~1400 kcals per day) with lipids  Current Nutrition:  TPN at 80 ml/hr TF now off, NGT at LIWS  Plan:  - Mag 2gm bolus per MD Continue TPN at goal rate 80 ml/hr to provide 115g protein, total kcal 1401. SMOF lipid emulsion added 5/12, had been held for first 7 days for  critically ill patients per ASPEN guidelines Electrolytes in TPN: 54mEq/L of Na, 24mEq/L of K, resume with 34mEq/L of Ca, 30mEq/L of Mg, and increase to 52mmol/L of Phos . Cl:Ac ratio 1:1 Add standard MVI and trace elements to TPN Continue q4h SSI, adjust to standard scale   Continue pepcid 40 mg/day in TPN Monitor TPN labs on Mon/Thurs, check BMET/Mg/Phos in AM Consider resume trickle feeds  18m PharmD 01/26/2020, 11:29 AM  Dedicated ICU phone [phone] till 2:30pm

## 2020-01-26 NOTE — Progress Notes (Addendum)
eLink Physician-Brief Progress Note Patient Name: Darlene Castillo DOB: 1961-06-26 MRN: [id number]   Date of Service  01/26/2020  HPI/Events of Note  Called d/t AFIB with RVR. Bedside monitor looks like sinus tachycardia with PAC's. BP = 170/87. K+ = 3.9, Mg++ = 1.9 and Creatinine = 0.65.   eICU Interventions  Will order: 1. 12 Lead EKG STAT.  2. Metoprolol 2.5 mg IV Q 3 hours PRN HR > 115.      Intervention Category Major Interventions: Arrhythmia - evaluation and management  Kriston Mckinnie Eugene 01/26/2020, 6:13 AM

## 2020-01-26 NOTE — Progress Notes (Addendum)
Spoke with elink re patient irregualr and tachycardic HR. Which is an acute change for this patient. Awaiting new orders  New orders received. Labs drawn and sent. cvp monitoring q4.

## 2020-01-26 NOTE — Progress Notes (Signed)
NAME:  Darlene Castillo, MRN:  [id number], DOB:  [birth year], LOS: 8 ADMISSION DATE:  01/18/2020, CONSULTATION DATE:  01/18/20 REFERRING MD:  Erroll Luna CHIEF COMPLAINT:  VDRF, septic shock  Brief History   Peritonitis 2/2 sigmoid colon perforation s/p ex-lap 5/5 with resection and end colostomy.  History of present illness   Darlene Castillo is a 5 yobf smoker  who presented early am 5/5  with abdominal pain, constipation, one episode of vomiting and was found to have a perforated sigmoid colon and peritonitis.  She went to the OR for ex lap and return to the ICU intubated.  Past Medical History  Chronic opiate use Tobacco abuse DM HTN  Significant Hospital Events   5/5 ex lap with partial colectomy and end colostomy for perf:  neg path    01/25/2020: Extubated yesterday evening but in significant respiratory distress and fatigue this morning.  Overnight decided on DO NOT INTUBATE status but husband questioning her capacity this morning.  In conversation with the patient it appears she does not have capacity.  She is not able to comprehend my conversation about intubation.  Husband reports patient is muslim and has intense desire to live.  He does not feel that patient's decision on no intubation is consistent with her values.  He also states it is not in her best interest.   Had a temperature and per [facility]CS has CT scan abdomen did  not find any abscess.  Panculture required. Labs probably not accurate - drawn from near TPN  - likely per pharmacist. Needs repeat lab draw    Consults:  [facility] PCCM  Procedures:  5/5 CVL RIJ 5/5 A-line 5/7  5/5 ET 5/6 PIC L brachial   Significant Diagnostic Tests:  CT abdomen pelvis with contrast 01/17/2020-scattered groundglass opacities in the lower lungs bilaterally, no focal opacities.  Abdominal free air.  Multiple dilated loops of bowel with stool.  Large R renal cyst. Path 5/5 >>> - Perforation with acute inflammation and acute serositis. No evidence of  malignancy.    Micro Data:  Covid negative FLu PCR - neg xxxxxxxxxx MRSA PCR 5/12 - neg Tracheal aspirate 01/25/20 - few GRN Urine analysis 01/25/2020  - no growth Blood culture 01/25/2020   Antimicrobials:  Zosyn 5/5  >> Eraxis 5/12 >>  Med profile   INfusion . sodium chloride Stopped (01/25/20 2356)  . anidulafungin    . HYDROmorphone 3.5 mg/hr (01/26/20 0600)  . midazolam 2 mg/hr (01/26/20 0600)  . piperacillin-tazobactam (ZOSYN)  IV 3.375 g (01/26/20 2010)  . TPN ADULT (ION) 80 mL/hr at 01/26/20 0600   Scheudled . albuterol  2.5 mg Nebulization Q6H  . chlorhexidine gluconate (MEDLINE KIT)  15 mL Mouth Rinse BID  . Chlorhexidine Gluconate Cloth  6 each Topical Daily  . cloNIDine  0.1 mg Per Tube Q6H  . enoxaparin (LOVENOX) injection  40 mg Subcutaneous Q24H  . insulin aspart  2-6 Units Subcutaneous Q4H  . mouth rinse  15 mL Mouth Rinse 10 times per day  . methadone  5 mg Intravenous Q6H  . nicotine  7 mg Transdermal Daily  . QUEtiapine  25 mg Per Tube QHS  . sodium chloride flush  10-40 mL Intracatheter Q12H   PRN sodium chloride, acetaminophen (TYLENOL) oral liquid 160 mg/5 mL, HYDROmorphone, metoprolol tartrate, midazolam, olopatadine, sodium chloride flush     Interim history/subjective:    01/26/2020 - Back on vent. fever curve better. PCT down to 3.4. Mag 1.8. Concern for A Fib  but EKG sinus tach with PAC. Down to 40% fio2 . TF on hold but getting meds via NG. On TPN., On dilaudid gtt, scheduled methadone, versed gtt, clonidine, seroquel and nicotine patch  Objective   Blood pressure (!) 171/83, pulse (!) 141, temperature 98.8 F (37.1 C), temperature source Axillary, resp. rate (!) 33, height _0  (1.549 m), weight 67 kg, last menstrual period 05/23/2013, SpO2 96 %. CVP:  [15 mmHg] 15 mmHg  Vent Mode: PRVC FiO2 (%):  [40 %-100 %] 40 % Set Rate:  [26 bmp] 26 bmp Vt Set:  [330 mL] 330 mL PEEP:  [5 cmH20-10 cmH20] 5 cmH20 Plateau Pressure:  [21 cmH20-26  cmH20] 24 cmH20   Intake/Output Summary (Last 24 hours) at 01/26/2020 0816 Last data filed at 01/26/2020 0600 Gross per 24 hour  Intake 4673.74 ml  Output 4055 ml  Net 618.74 ml   Filed Weights   01/18/20 1300  Weight: 67 kg    CVP:  [15 mmHg] 15 mmHg   mricu     Resolved problems   Circulatory/septic shock    Assessment & Plan:  LOS 8 days   Acute hypoxic respiratory failure requiring mechanical ventilation.   01/26/2020 - > does not meet criteria for SBT/Extubation in setting of Acute Respiratory Failure due to tachypnea and ongoing sedation needs and low volume ventilation despite improvemen tin fiop2 - very physically deconditioned     Plan  -  Continue PRVC - SBT as tolerated but no extubation - Likely needs trach to get her out of the deonditoning  Chronic pain needs on oral methadone program Acute encephalopathy 01/25/20   01/26/20 - improved orientation but on scheduled , On dilaudid gtt, scheduled methadone, versed gtt, clonidine, seroquel and nicotine patch  Plan -RASS sedation score 0 to -2 -Dilaudid nfusion and versed infusion -Cotninue IV scheduled methadone  -Continue scheduled Seroquel - nicotine patch -Monitor QTC (Qtc < 520mec5/13)   Peritonitis 2/2 sigmoid colon perforation from chronic constipation - s/p ex lap with sigmoid resection and colostomy.  01/26/2020 -  Improved fever and PCT . Growing GNR in sputum  Plan - Continue empiric zosyn.  - Eraxis 01/26/20 >> - await pan culture 01/25/20   Malnutrition. Expected prolonged ileus post op.  plan - Continue TPN. - Hold TF on trickle - decide restart per [facility]  Electrolyte imbalance  01/26/2020 - Mag < 2g%  Plan  - replete mag   Rest per primary team.   Best practice:  Diet: NPO.  TPN for now. TF on hold Pain/Anxiety/Delirium protocol (if indicated): see above VAP protocol (if indicated): In place. DVT prophylaxis: SCDs and lovenox. GI prophylaxis: pepcid. Glucose  control: SSI. Mobility: bedrest. Code Status: full. Family Communication: husband at bedside by RN - patient heading towards trach Disposition: ICU.    ATTESTATION & SIGNATURE   The patient Darlene BARTELSONis critically ill with multiple organ systems failure and requires high complexity decision making for assessment and support, frequent evaluation and titration of therapies, application of advanced monitoring technologies and extensive interpretation of multiple databases.   Critical Care Time devoted to patient care services described in this note is  35  Minutes. This time reflects time of care of this signee Dr MBrand Males This critical care time does not reflect procedure time, or teaching time or supervisory time of PA/NP/Med student/Med Resident etc but could involve care discussion time     Dr. MBrand Males M.D., FMedstar Montgomery Medical CenterC.P Pulmonary and Critical Care Medicine Staff Physician  [facility] Pager: [phone], If no answer or between  15:00h - 7:00h: call [phone]  01/26/2020 8:43 AM    LABS    PULMONARY Recent Labs  Lab 01/20/20 0729 01/21/20 1300 01/25/20 0920  PHART 7.431 7.433 7.342*  PCO2ART 30.0* 38.3 56.7*  PO2ART 109* 67.1* 66.8*  HCO3 19.6* 25.1 30.0*  O2SAT 98.1 92.3 90.1    CBC Recent Labs  Lab 01/22/20 0445 01/23/20 0345 01/25/20 0904  HGB 8.9* 9.0* 8.6*  HCT 26.4* 26.8* 25.7*  WBC 9.3 11.3* 21.4*  PLT 148* 181 285    COAGULATION No results for input(s): INR in the last 168 hours.  CARDIAC  No results for input(s): TROPONINI in the last 168 hours. No results for input(s): PROBNP in the last 168 hours.   CHEMISTRY Recent Labs  Lab 01/23/20 0345 01/23/20 0345 01/24/20 0350 01/24/20 0350 01/25/20 0420 01/25/20 0420 01/25/20 0904 01/25/20 0904 01/25/20 0925 01/26/20 0031 01/26/20 0544  NA 139   < > 140  --  140  --  145  --   --  139 139  K 4.2   < > 4.0   < > 5.6*   < >  4.1   < >  --  3.9 4.1  CL 106   < > 107  --  105  --  106  --   --  105 101  CO2 24   < > 26  --  27  --  28  --   --  27 27  GLUCOSE 166*   < > 255*  --  560*  --  181*  --   --  180* 183*  BUN 18   < > 21*  --  20  --  19  --   --  19 17  CREATININE 0.64   < > 0.62  --  0.63  --  0.65  --   --  0.65 0.55  CALCIUM 8.0*   < > 8.4*  --  8.1*  --  8.4*  --   --  7.8* 8.1*  MG 1.8   < > 2.1  --  2.2  --   --   --  2.0 1.9 1.8  PHOS 4.4  --  3.5  --  5.4*  --   --   --  4.5  --  3.1   < > = values in this interval not displayed.   Estimated Creatinine Clearance: 66.3 mL/min (by C-G formula based on SCr of 0.55 mg/dL).   LIVER Recent Labs  Lab 01/23/20 0345 01/24/20 0350 01/25/20 0420 01/25/20 0904 01/26/20 0544  AST 43* 37 39 36 33  ALT _0 ALKPHOS 81 134* 170* 182* 203*  BILITOT 0.3 0.6 0.5 0.5 0.4  PROT 4.9* 5.6* 5.7* 5.9* 6.0*  ALBUMIN 1.8* 1.8* 1.9* 1.9* 1.9*     INFECTIOUS Recent Labs  Lab 01/19/20 0835 01/19/20 1300 01/23/20 0345 01/25/20 0904 01/26/20 0544  LATICACIDVEN 2.8* 2.1*  --  1.0  --   PROCALCITON  --   --  14.12 4.87 3.40     ENDOCRINE CBG (last 3)  Recent Labs    01/25/20 2334 01/26/20 0452 01/26/20 0739  GLUCAP 160* 167* 167*         IMAGING x48h  - image(s) personally visualized  -   highlighted in bold CT ABDOMEN PELVIS W CONTRAST  Result  Date: 01/24/2020 CLINICAL DATA:  No history of recent sigmoid colon perforation with subsequent left-sided colostomy and Hartmann's pouch creation, postoperative fever evaluate for possible abscess EXAM: CT ABDOMEN AND PELVIS WITH CONTRAST TECHNIQUE: Multidetector CT imaging of the abdomen and pelvis was performed using the standard protocol following bolus administration of intravenous contrast. CONTRAST:  185m OMNIPAQUE IOHEXOL 300 MG/ML  SOLN COMPARISON:  01/18/2011 FINDINGS: Lower chest: Lung bases demonstrate bilateral lower lobe consolidation with associated effusions. This may be  related to postoperative atelectasis although superimposed pneumonia cannot be totally excluded. Hepatobiliary: Liver demonstrates diffuse fatty infiltration. The gallbladder is decompressed with some mild pericholecystic fluid which is likely related to the recent surgery and perforation. Some additional fluid is noted along the tip of the liver inferiorly. Pancreas: Unremarkable. No pancreatic ductal dilatation or surrounding inflammatory changes. Spleen: Spleen is within normal limits. Adrenals/Urinary Tract: Adrenal glands are unremarkable. Kidneys demonstrate a normal enhancement pattern. Stable large right renal cyst is again noted. No renal calculi or obstructive changes are noted. The bladder is decompressed by Foley catheter. Stomach/Bowel: Postsurgical changes are noted in the colon with a Hartmann's pouch deep within the pelvis. 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 No complicating factors are seen. There remains a considerable amount of fecal material throughout the colon with diffuse wall thickening similar to that noted on prior exam. No definitive colonic perforation is noted at this time. Small bowel is well enhanced and within normal limits. Gastric catheter is noted in place. The stomach is decompressed. Vascular/Lymphatic: Aortic atherosclerosis. No enlarged abdominal or pelvic lymph nodes. Reproductive: Uterus is stable in appearance. Other: Free fluid is noted within the pelvis both anterior and posterior to the uterus as well as extending superiorly surrounding the loops of colon and small bowel. This is likely related to the recent surgery. No definitive enhancing fluid collection to suggest abscess formation is noted at this time. Musculoskeletal: Postsurgical wound is noted in the anterior abdomen healing by secondary intention. Degenerative changes of lumbar spine are seen. The nodular changes in the right breast are less well visualized on today's exam.  IMPRESSION: Postsurgical changes consistent with the given clinical history. No colostomy complications are seen. There remains diffuse inflammatory wall thickening throughout the colon with diffuse fecal material identified. No findings of perforation are noted. Considerable free fluid within the abdomen and pelvis likely related to the recent surgery. No definitive abscess is noted at this time. Continued follow-up is recommended. Fluid surrounding the gallbladder which is predominately decompressed likely related to the free fluid within the abdomen and reactive in nature. No definitive changes of cholecystitis are seen. Bilateral lower lobe consolidation with associated effusions. This may be related to the postoperative state although superimposed pneumonia could not be totally excluded. This likely contributes to the underlying fevers. Electronically Signed   By: MInez CatalinaM.D.   On: 01/24/2020 17:53   DG CHEST PORT 1 VIEW  Result Date: 01/26/2020 CLINICAL DATA:  Acute respiratory failure. EXAM: PORTABLE CHEST 1 VIEW COMPARISON:  Chest x-ray from yesterday. FINDINGS: Unchanged endotracheal and enteric tubes. Unchanged left upper extremity PICC line. Stable cardiomediastinal silhouette with normal heart size. Patchy airspace disease in both lungs, worsened at the left lung base. No pneumothorax or large pleural effusion. No acute osseous abnormality. IMPRESSION: 1. Bilateral airspace disease, worsened in the left lower lobe, stable elsewhere. 2. Stable lines and tubes. Electronically Signed   By: WTitus DubinM.D.   On: 01/26/2020 07:09  DG CHEST PORT 1 VIEW  Result Date: 01/25/2020 CLINICAL DATA:  Hypoxia EXAM: PORTABLE CHEST 1 VIEW COMPARISON:  Jan 23, 2020 FINDINGS: Endotracheal tube tip is 1.8 cm above the carina. Nasogastric tube tip and side port are in the stomach. Central catheter tip is in the right atrium near the cavoatrial junction. No pneumothorax. There is widespread airspace  opacity throughout the lungs bilaterally with overall increase in right upper lobe airspace opacity and slight decrease in right lower lobe opacity. Areas of airspace opacity on the left are essentially stable. Heart size and pulmonary vascularity normal. No adenopathy. No bone lesions. IMPRESSION: Tube and catheter positions as described without pneumothorax. Widespread airspace opacity bilaterally, similar on the left with increased right upper lobe opacity and somewhat decreased right lower lobe opacity compared to 1 day prior. Stable cardiac silhouette. Appearance likely due to multifocal pneumonia, although a degree of edema and/or ARDS could be present superimposed. Advise check of COVID-19 status given this overall appearance. Electronically Signed   By: Lowella Grip III M.D.   On: 01/25/2020 08:59   DG Abd Portable 1V  Result Date: 01/25/2020 CLINICAL DATA:  Check gastric catheter placement EXAM: PORTABLE ABDOMEN - 1 VIEW COMPARISON:  None. FINDINGS: Gastric catheter is noted within the mid stomach. Scattered large and small bowel gas is noted. Fecal material is noted within the transverse colon. No free air is seen. IMPRESSION: Gastric catheter within the stomach. Electronically Signed   By: Inez Catalina M.D.   On: 01/25/2020 21:22

## 2020-01-26 NOTE — Progress Notes (Signed)
[facility] for Heparin Indication: DVT , R brachial  Allergies  Allergen Reactions  . Ibuprofen Itching   Patient Measurements: Height: 5' 1"  (154.9 cm) Weight: 67 kg (147 lb 11.3 oz) IBW/kg (Calculated) : 47.8 Heparin Dosing Weight: 67kg  Vital Signs: Temp: 102.4 F (39.1 C) (05/13 0800) Temp Source: Oral (05/13 0800) BP: 171/83 (05/13 0642) Pulse Rate: 141 (05/13 0737)  Labs: Recent Labs    01/25/20 0904 01/26/20 0031 01/26/20 0544  HGB 8.6*  --   --   HCT 25.7*  --   --   PLT 285  --   --   CREATININE 0.65 0.65 0.55   Estimated Creatinine Clearance: 66.3 mL/min (by C-G formula based on SCr of 0.55 mg/dL).  Medical History: Past Medical History:  Diagnosis Date  . Blurred vision   . Cervicalgia   . Chronic pain   . Diabetes mellitus without complication (HCC)    diet controlled borderline  . Headache   . Hypertension   . Numbness and tingling    Medications:  Scheduled:  . albuterol  2.5 mg Nebulization Q6H  . chlorhexidine gluconate (MEDLINE KIT)  15 mL Mouth Rinse BID  . Chlorhexidine Gluconate Cloth  6 each Topical Daily  . cloNIDine  0.1 mg Per Tube Q6H  . feeding supplement (VITAL HIGH PROTEIN)  1,000 mL Per Tube Q24H  . insulin aspart  2-6 Units Subcutaneous Q4H  . mouth rinse  15 mL Mouth Rinse 10 times per day  . methadone  5 mg Intravenous Q6H  . nicotine  7 mg Transdermal Daily  . polyethylene glycol  17 g Per Tube Daily  . QUEtiapine  25 mg Per Tube QHS  . sodium chloride flush  10-40 mL Intracatheter Q12H   Infusions:  . sodium chloride Stopped (01/25/20 2356)  . anidulafungin    . dexmedetomidine (PRECEDEX) IV infusion 0.5 mcg/kg/hr (01/26/20 1118)  . HYDROmorphone 3.5 mg/hr (01/26/20 0600)  . magnesium sulfate bolus IVPB 2 g (01/26/20 1155)  . midazolam 2 mg/hr (01/26/20 0600)  . piperacillin-tazobactam (ZOSYN)  IV Stopped (01/26/20 1033)  . TPN ADULT (ION) 80 mL/hr at 01/26/20 0600  . TPN ADULT  (ION)     Assessment:  19 yoF s/p Sigmoid colon perforation secondary to chronic constipation and ulceration with feculent peritonitis and gross contamination of abdominal cavity -S/pExploratory laparotomy with partial colectomy and end colostomy5/5 Dr. Brantley Stage 5/13: begin IV Heparin for R brachial CVT, no bolus per [facility]  Goal of Therapy:  Heparin level 0.3-0.7 units/ml Monitor platelets by anticoagulation protocol: Yes   Plan:  Discontinued Lovenox prophylaxis (last dose 5/12 at 1800) Begin Heparin infusion at 1000 units/hr, no bolus 1st Heparin level at 8pm (5/13) Daily CBC, begin daily Hep level at steady state   Minda Ditto PharmD 01/26/2020,11:51 AM

## 2020-01-26 NOTE — Progress Notes (Signed)
Doppler upper and lower extremities done this AM:  RIGHT:  - There is no evidence of deep vein thrombosis in the lower extremity.   - No cystic structure found in the popliteal fossa.   LEFT:  - There is no evidence of deep vein thrombosis in the lower extremity.   - No cystic structure found in the popliteal fossa.     Right:  No evidence of superficial vein thrombosis in the upper extremity.  Findings consistent with acute deep vein thrombosis involving the right brachial  veins.    Left:  No evidence of deep vein thrombosis in the upper extremity. No evidence of  superficial vein thrombosis in the upper extremity.   I called Dr. Marchelle Gearing and he agrees with IV heparin, no bolus.  Pharmacy consult requested.

## 2020-01-26 NOTE — Progress Notes (Signed)
At shift change, this RN along with dayshift RN updated patients husband who was at bedside. Patients daughter also called this RN and was updated and all questions were answered. RN will continue to monitor.

## 2020-01-26 NOTE — Progress Notes (Signed)
[facility] Progress Note  8 Days Post-Op  Subjective: CC-  Awake on the vent. TMAX 102.7. On zosyn and eraxis. Questionable Afib RVR over night, given metoprolol. EKG shows sinus tachy with PAC's. CCM discussing trach.  Objective: Vital signs in last 24 hours: Temp:  [98.7 F (37.1 C)-102.7 F (39.3 C)] 102.4 F (39.1 C) (05/13 0800) Pulse Rate:  [88-155] 141 (05/13 0737) Resp:  [23-39] 33 (05/13 0737) BP: (90-176)/(45-101) 171/83 (05/13 0642) SpO2:  [95 %-100 %] 96 % (05/13 0737) FiO2 (%):  [40 %-80 %] 40 % (05/13 0737) Last BM Date: 01/24/20  Intake/Output from previous day: 05/12 0701 - 05/13 0700 In: 5025.5 [I.V.:2145.2; NG/GT:1056; IV Piggyback:1824.2] Out: 4055 [Urine:3875; Emesis/NG output:150; Drains:25; Stool:5] Intake/Output this shift: No intake/output data recorded.  PE: Gen: Lethargic Card: tachy Pulm: mechanically ventilated Abd: distended, hypoactive BS, vac to midline incision with good seal, ostomy viable with scant liquid stool in pouch Msk: calves soft but tender bilaterally, mild edema  Lab Results:  Recent Labs    01/25/20 0904  WBC 21.4*  HGB 8.6*  HCT 25.7*  PLT 285   BMET Recent Labs    01/26/20 0031 01/26/20 0544  NA 139 139  K 3.9 4.1  CL 105 101  CO2 27 27  GLUCOSE 180* 183*  BUN 19 17  CREATININE 0.65 0.55  CALCIUM 7.8* 8.1*   PT/INR No results for input(s): LABPROT, INR in the last 72 hours. CMP     Component Value Date/Time   NA 139 01/26/2020 0544   K 4.1 01/26/2020 0544   CL 101 01/26/2020 0544   CO2 27 01/26/2020 0544   GLUCOSE 183 (H) 01/26/2020 0544   BUN 17 01/26/2020 0544   CREATININE 0.55 01/26/2020 0544   CALCIUM 8.1 (L) 01/26/2020 0544   PROT 6.0 (L) 01/26/2020 0544   ALBUMIN 1.9 (L) 01/26/2020 0544   AST 33 01/26/2020 0544   ALT 26 01/26/2020 0544   ALKPHOS 203 (H) 01/26/2020 0544   BILITOT 0.4 01/26/2020 0544   GFRNONAA >60 01/26/2020 0544   GFRAA >60 01/26/2020 0544   Lipase      Component Value Date/Time   LIPASE 19 01/18/2020 0525       Studies/Results: CT ABDOMEN PELVIS W CONTRAST  Result Date: 01/24/2020 CLINICAL DATA:  No history of recent sigmoid colon perforation with subsequent left-sided colostomy and Hartmann's pouch creation, postoperative fever evaluate for possible abscess EXAM: CT ABDOMEN AND PELVIS WITH CONTRAST TECHNIQUE: Multidetector CT imaging of the abdomen and pelvis was performed using the standard protocol following bolus administration of intravenous contrast. CONTRAST:  OMNIPAQUE IOHEXOL 300 MG/ML  SOLN COMPARISON:  01/18/2011 FINDINGS: Lower chest: Lung bases demonstrate bilateral lower lobe consolidation with associated effusions. This may be related to postoperative atelectasis although superimposed pneumonia cannot be totally excluded. Hepatobiliary: Liver demonstrates diffuse fatty infiltration. The gallbladder is decompressed with some mild pericholecystic fluid which is likely related to the recent surgery and perforation. Some additional fluid is noted along the tip of the liver inferiorly. Pancreas: Unremarkable. No pancreatic ductal dilatation or surrounding inflammatory changes. Spleen: Spleen is within normal limits. Adrenals/Urinary Tract: Adrenal glands are unremarkable. Kidneys demonstrate a normal enhancement pattern. Stable large right renal cyst is again noted. No renal calculi or obstructive changes are noted. The bladder is decompressed by Foley catheter. Stomach/Bowel: Postsurgical changes are noted in the colon with a Hartmann's pouch deep within the pelvis. 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 No  complicating factors are seen. There remains a considerable amount of fecal material throughout the colon with diffuse wall thickening similar to that noted on prior exam. No definitive colonic perforation is noted at this time. Small bowel is well enhanced and within normal limits. Gastric  catheter is noted in place. The stomach is decompressed. Vascular/Lymphatic: Aortic atherosclerosis. No enlarged abdominal or pelvic lymph nodes. Reproductive: Uterus is stable in appearance. Other: Free fluid is noted within the pelvis both anterior and posterior to the uterus as well as extending superiorly surrounding the loops of colon and small bowel. This is likely related to the recent surgery. No definitive enhancing fluid collection to suggest abscess formation is noted at this time. Musculoskeletal: Postsurgical wound is noted in the anterior abdomen healing by secondary intention. Degenerative changes of lumbar spine are seen. The nodular changes in the right breast are less well visualized on today's exam. IMPRESSION: Postsurgical changes consistent with the given clinical history. No colostomy complications are seen. There remains diffuse inflammatory wall thickening throughout the colon with diffuse fecal material identified. No findings of perforation are noted. Considerable free fluid within the abdomen and pelvis likely related to the recent surgery. No definitive abscess is noted at this time. Continued follow-up is recommended. Fluid surrounding the gallbladder which is predominately decompressed likely related to the free fluid within the abdomen and reactive in nature. No definitive changes of cholecystitis are seen. Bilateral lower lobe consolidation with associated effusions. This may be related to the postoperative state although superimposed pneumonia could not be totally excluded. This likely contributes to the underlying fevers. Electronically Signed   By: Inez Catalina M.D.   On: 01/24/2020 17:53   DG CHEST PORT 1 VIEW  Result Date: 01/26/2020 CLINICAL DATA:  Acute respiratory failure. EXAM: PORTABLE CHEST 1 VIEW COMPARISON:  Chest x-ray from yesterday. FINDINGS: Unchanged endotracheal and enteric tubes. Unchanged left upper extremity PICC line. Stable cardiomediastinal silhouette with  normal heart size. Patchy airspace disease in both lungs, worsened at the left lung base. No pneumothorax or large pleural effusion. No acute osseous abnormality. IMPRESSION: 1. Bilateral airspace disease, worsened in the left lower lobe, stable elsewhere. 2. Stable lines and tubes. Electronically Signed   By: Titus Dubin M.D.   On: 01/26/2020 07:09   DG CHEST PORT 1 VIEW  Result Date: 01/25/2020 CLINICAL DATA:  Hypoxia EXAM: PORTABLE CHEST 1 VIEW COMPARISON:  Jan 23, 2020 FINDINGS: Endotracheal tube tip is 1.8 cm above the carina. Nasogastric tube tip and side port are in the stomach. Central catheter tip is in the right atrium near the cavoatrial junction. No pneumothorax. There is widespread airspace opacity throughout the lungs bilaterally with overall increase in right upper lobe airspace opacity and slight decrease in right lower lobe opacity. Areas of airspace opacity on the left are essentially stable. Heart size and pulmonary vascularity normal. No adenopathy. No bone lesions. IMPRESSION: Tube and catheter positions as described without pneumothorax. Widespread airspace opacity bilaterally, similar on the left with increased right upper lobe opacity and somewhat decreased right lower lobe opacity compared to 1 day prior. Stable cardiac silhouette. Appearance likely due to multifocal pneumonia, although a degree of edema and/or ARDS could be present superimposed. Advise check of COVID-19 status given this overall appearance. Electronically Signed   By: Lowella Grip III M.D.   On: 01/25/2020 08:59   DG Abd Portable 1V  Result Date: 01/25/2020 CLINICAL DATA:  Check gastric catheter placement EXAM: PORTABLE ABDOMEN - 1 VIEW COMPARISON:  None.  FINDINGS: Gastric catheter is noted within the mid stomach. Scattered large and small bowel gas is noted. Fecal material is noted within the transverse colon. No free air is seen. IMPRESSION: Gastric catheter within the stomach. Electronically Signed   By:  Alcide Clever M.D.   On: 01/25/2020 21:22    Anti-infectives: Anti-infectives (From admission, onward)   Start     Dose/Rate Route Frequency Ordered Stop   01/26/20 1000  anidulafungin (ERAXIS) 100 mg in sodium chloride 0.9 % 100 mL IVPB     100 mg 78 mL/hr over 100 Minutes Intravenous Every 24 hours 01/25/20 0956     01/25/20 1100  anidulafungin (ERAXIS) 200 mg in sodium chloride 0.9 % 200 mL IVPB     200 mg 78 mL/hr over 200 Minutes Intravenous  Once 01/25/20 1006 01/25/20 1525   01/18/20 1400  piperacillin-tazobactam (ZOSYN) IVPB 3.375 g     3.375 g 12.5 mL/hr over 240 Minutes Intravenous Every 8 hours 01/18/20 1311 01/28/20 1359   01/18/20 0715  piperacillin-tazobactam (ZOSYN) IVPB 3.375 g     3.375 g 100 mL/hr over 30 Minutes Intravenous  Once 01/18/20 0707 01/18/20 0837       Assessment/Plan HTN Prediabetic- A1c6.3although glucose elevated >200,SSI Tobacco abuse 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 CCM discussing trach Fever - CT without definite IAA abscess 5/11, Ucx neg, Rcx GNR, Bcx pending. Continue broad spectrum abx. Will check dopplers as well  Septic shock Sigmoid colon perforation secondary to chronic constipation and ulceration with feculent peritonitis and gross contamination of abdominal cavity with stool balls -S/pExploratory laparotomy with partial colectomy and end colostomy5/5 Dr. Luisa Hart - POD#8 - surgical path:Perforation with acute inflammation and acute serositis, no evidence of malignancy -wound vac MWF - WOC RNfollowingfor new colostomyand vac - continue IV zosyn >/= 10 days for peritonitis - CT 5/11 showed diffuse inflammatory wall thickening throughout the colon with diffuse fecal material identified, free fluid in the pelvis but no definitive abscess  ID -zosyn 5/5>>day#8,  eraxis 5/12>>day#2 FEN -NPO/NGT to LIWS,TPN, trophic TF VTE -SCDs, lovenox Foley -d/c 5/13  Plan: Restart trophic TF but do not advance. Continue TPN. Persistent fevers on broad spectrum antibiotics; will add UE/LE dopplers to rule out DVT; follow up cultures. Appreciate CCM assistance.    LOS: 8 days    Darlene Castillo, [facility] 01/26/2020, 10:00 AM Please see Amion for pager number during day hours 7:00am-4:30pm

## 2020-01-27 ENCOUNTER — Inpatient Hospital Stay (HOSPITAL_COMMUNITY): Payer: BLUE CROSS/BLUE SHIELD

## 2020-01-27 LAB — CBC
HCT: 23.6 % — ABNORMAL LOW (ref 36.0–46.0)
Hemoglobin: 7.7 g/dL — ABNORMAL LOW (ref 12.0–15.0)
MCH: 27.2 pg (ref 26.0–34.0)
MCHC: 32.6 g/dL (ref 30.0–36.0)
MCV: 83.4 fL (ref 80.0–100.0)
Platelets: 455 10*3/uL — ABNORMAL HIGH (ref 150–400)
RBC: 2.83 MIL/uL — ABNORMAL LOW (ref 3.87–5.11)
RDW: 14 % (ref 11.5–15.5)
WBC: 15.8 10*3/uL — ABNORMAL HIGH (ref 4.0–10.5)
nRBC: 0.3 % — ABNORMAL HIGH (ref 0.0–0.2)

## 2020-01-27 LAB — BASIC METABOLIC PANEL
Anion gap: 8 (ref 5–15)
BUN: 27 mg/dL — ABNORMAL HIGH (ref 6–20)
CO2: 26 mmol/L (ref 22–32)
Calcium: 8.1 mg/dL — ABNORMAL LOW (ref 8.9–10.3)
Chloride: 103 mmol/L (ref 98–111)
Creatinine, Ser: 0.73 mg/dL (ref 0.44–1.00)
GFR calc Af Amer: >60 mL/min (ref >60)
GFR calc non Af Amer: >60 mL/min (ref >60)
Glucose, Bld: 217 mg/dL — ABNORMAL HIGH (ref 70–99)
Potassium: 4.6 mmol/L (ref 3.5–5.1)
Sodium: 137 mmol/L (ref 135–145)

## 2020-01-27 LAB — PROCALCITONIN: Procalcitonin: 3.06 ng/mL

## 2020-01-27 LAB — CULTURE, RESPIRATORY W GRAM STAIN

## 2020-01-27 LAB — GLUCOSE, CAPILLARY
Glucose-Capillary: 178 mg/dL — ABNORMAL HIGH (ref 70–99)
Glucose-Capillary: 191 mg/dL — ABNORMAL HIGH (ref 70–99)
Glucose-Capillary: 192 mg/dL — ABNORMAL HIGH (ref 70–99)
Glucose-Capillary: 193 mg/dL — ABNORMAL HIGH (ref 70–99)
Glucose-Capillary: 213 mg/dL — ABNORMAL HIGH (ref 70–99)
Glucose-Capillary: 225 mg/dL — ABNORMAL HIGH (ref 70–99)

## 2020-01-27 LAB — LACTIC ACID, PLASMA: Lactic Acid, Venous: 1.1 mmol/L (ref 0.5–1.9)

## 2020-01-27 LAB — HEPARIN LEVEL (UNFRACTIONATED)
Heparin Unfractionated: 0.13 IU/mL — ABNORMAL LOW (ref 0.30–0.70)
Heparin Unfractionated: 0.48 IU/mL (ref 0.30–0.70)

## 2020-01-27 LAB — PHOSPHORUS: Phosphorus: 4.2 mg/dL (ref 2.5–4.6)

## 2020-01-27 LAB — MAGNESIUM: Magnesium: 2.5 mg/dL — ABNORMAL HIGH (ref 1.7–2.4)

## 2020-01-27 MED ORDER — VITAL HIGH PROTEIN PO LIQD
1000.0000 mL | ORAL | Status: AC
  Administered 2020-01-27: 1000 mL

## 2020-01-27 MED ORDER — HEPARIN (PORCINE) 25000 UT/250ML-% IV SOLN
1700.0000 [IU]/h | INTRAVENOUS | Status: DC
  Filled 2020-01-27: qty 250

## 2020-01-27 MED ORDER — PRO-STAT SUGAR FREE PO LIQD
30.0000 mL | Freq: Two times a day (BID) | ORAL | Status: DC
  Administered 2020-01-27 – 2020-01-31 (×9): 30 mL
  Filled 2020-01-27 (×9): qty 30

## 2020-01-27 MED ORDER — VITAL AF 1.2 CAL PO LIQD
1000.0000 mL | ORAL | Status: DC
  Administered 2020-01-27 – 2020-01-28 (×3): 1000 mL

## 2020-01-27 MED ORDER — VITAL HIGH PROTEIN PO LIQD: 1000.0000 mL | ORAL | Status: DC

## 2020-01-27 MED ORDER — QUETIAPINE FUMARATE 50 MG PO TABS
50.0000 mg | ORAL_TABLET | Freq: Every day | ORAL | Status: DC
  Administered 2020-01-27 – 2020-02-09 (×13): 50 mg
  Filled 2020-01-27 (×13): qty 1

## 2020-01-27 MED ORDER — LACTULOSE 10 GM/15ML PO SOLN
30.0000 g | Freq: Two times a day (BID) | ORAL | Status: DC
  Administered 2020-01-27 – 2020-01-29 (×6): 30 g via ORAL
  Filled 2020-01-27 (×7): qty 45

## 2020-01-27 MED ORDER — METHADONE HCL 10 MG/ML IJ SOLN: 5.0000 mg | Freq: Three times a day (TID) | INTRAMUSCULAR | Status: DC

## 2020-01-27 MED ORDER — TRAVASOL 10 % IV SOLN
INTRAVENOUS | Status: AC
  Filled 2020-01-27: qty 576

## 2020-01-27 MED ORDER — CLONIDINE HCL 0.1 MG PO TABS
0.2000 mg | ORAL_TABLET | Freq: Four times a day (QID) | ORAL | Status: DC
  Administered 2020-01-27 – 2020-02-01 (×19): 0.2 mg
  Filled 2020-01-27 (×20): qty 2

## 2020-01-27 MED ORDER — MIDAZOLAM 50MG/50ML (1MG/ML) PREMIX INFUSION
2.0000 mg/h | INTRAVENOUS | Status: DC
  Administered 2020-01-27 – 2020-01-31 (×6): 2 mg/h via INTRAVENOUS
  Administered 2020-02-01: 4 mg/h via INTRAVENOUS
  Administered 2020-02-01: 1.5 mg/h via INTRAVENOUS
  Filled 2020-01-27 (×8): qty 50

## 2020-01-27 MED ORDER — SODIUM CHLORIDE 0.9 % IV SOLN
2.0000 g | INTRAVENOUS | Status: DC
  Administered 2020-01-27 – 2020-01-31 (×5): 2 g via INTRAVENOUS
  Filled 2020-01-27 (×2): qty 2
  Filled 2020-01-27: qty 20
  Filled 2020-01-27 (×2): qty 2

## 2020-01-27 NOTE — Progress Notes (Signed)
[place] for Heparin Indication: DVT , R brachial  Allergies  Allergen Reactions  . Ibuprofen Itching   Patient Measurements: Height: _0  (154.9 cm) Weight: 76.4 kg (168 lb 6.9 oz) IBW/kg (Calculated) : 47.8 Heparin Dosing Weight: 67kg  Vital Signs: Temp: 101.9 F (38.8 C) (05/14 0800) Temp Source: Oral (05/14 0800) BP: 111/61 (05/14 1100) Pulse Rate: 87 (05/14 1100)  Labs: Recent Labs    01/25/20 0904 01/25/20 0904 01/26/20 0031 01/26/20 0544 01/26/20 2100 01/27/20 0347 01/27/20 1055  HGB 8.6*  --   --   --   --  7.7*  --   HCT 25.7*  --   --   --   --  23.6*  --   PLT 285  --   --   --   --  455*  --   HEPARINUNFRC  --   --   --   --  0.17*  --  0.13*  CREATININE 0.65   < > 0.65 0.55  --  0.73  --    < > = values in this interval not displayed.   Estimated Creatinine Clearance: 70.8 mL/min (by C-G formula based on SCr of 0.73 mg/dL).  Medical History: Past Medical History:  Diagnosis Date  . Blurred vision   . Cervicalgia   . Chronic pain   . Diabetes mellitus without complication (HCC)    diet controlled borderline  . Headache   . Hypertension   . Numbness and tingling    Medications:  Scheduled:  . albuterol  2.5 mg Nebulization Q6H  . chlorhexidine gluconate (MEDLINE KIT)  15 mL Mouth Rinse BID  . Chlorhexidine Gluconate Cloth  6 each Topical Daily  . cloNIDine  0.2 mg Per Tube Q6H  . feeding supplement (PRO-STAT SUGAR FREE 64)  30 mL Per Tube BID  . feeding supplement (VITAL HIGH PROTEIN)  1,000 mL Per Tube Q24H  . insulin aspart  2-6 Units Subcutaneous Q4H  . lactulose  30 g Oral BID  . mouth rinse  15 mL Mouth Rinse 10 times per day  . methadone  5 mg Intravenous Q6H  . nicotine  7 mg Transdermal Daily  . polyethylene glycol  17 g Per Tube Daily  . QUEtiapine  50 mg Per Tube QHS  . sodium chloride flush  10-40 mL Intracatheter Q12H   Infusions:  . sodium chloride Stopped (01/25/20 2356)  .  cefTRIAXone (ROCEPHIN)  IV    . dexmedetomidine (PRECEDEX) IV infusion Stopped (01/27/20 1102)  . feeding supplement (VITAL AF 1.2 CAL)    . heparin 1,200 Units/hr (01/27/20 1103)  . HYDROmorphone 4 mg/hr (01/27/20 1103)  . TPN ADULT (ION) 80 mL/hr at 01/27/20 1103  . TPN ADULT (ION)     Assessment:  37 yoF s/p Sigmoid colon perforation secondary to chronic constipation and ulceration with feculent peritonitis and gross contamination of abdominal cavity -S/pExploratory laparotomy with partial colectomy and end colostomy5/5 Dr. Brantley Stage 5/13: begin IV Heparin for R brachial DVT at 1000 units/hr, no bolus per CCS 1st Hep level 0.17 units/ml, rate incr to 1200 units/hr  Goal of Therapy:  Heparin level 0.3-0.7 units/ml Monitor platelets by anticoagulation protocol: Yes   Today, 01/27/2020 1100 Hep level 0.13 units/ml on 1200 units/hr, subtherapeutic   Plan:  Increase Heparin to 1700 units/hr Check level in 6 hr, at 1800 Daily CBC, begin daily Hep level at steady state   Minda Ditto PharmD 01/27/2020,11:35 AM

## 2020-01-27 NOTE — Progress Notes (Signed)
PHARMACY - TOTAL PARENTERAL NUTRITION CONSULT NOTE   Indication: Prolonged ileus  Patient Measurements: Height: 5\' 1"  (154.9 cm) Weight: 76.4 kg (168 lb 6.9 oz) IBW/kg (Calculated) : 47.8   Body mass index is 31.82 kg/m. Usual Weight: 67 kg  Assessment: 59 yo F s/p Sigmoid colon perforation secondary to chronic constipation and ulceration with feculent peritonitis and gross contamination of abdominal cavity with stool balls  -S/p Exploratory laparotomy with partial colectomy and end colostomy 5/5 Dr. 46 5/6 PICC placed and started TPN for nutrition support; expect prolonged ileus  Glucose / Insulin: prediabetes, A1c 6.3, CBGs higher with tube feed started 5/13, range 168-192, 24 units insulin/24 hr Electrolytes: Phos 4.2, Mag 1.8 > 2.5 after 2gm bolus (5/13) (goal >=2) ,  K 4.6 (goal >=4); CoCa 9.7 Renal: Scr WNL, 2.7 L charted UOP over past 24hr LFTs / TGs: WNL; Trig 27> 331> 434 > 270 > 210, propofol dc'd 5/7, Lipids now in formula Prealbumin / albumin: prealbumin 9.7 5/6 > now < 5 reflects inflammation of post-op stress and critical illness, admit alb 4.5 WNL, BMI 27.9, well-nourished PTA, poor PO intake for 5 days PTA Intake / Output; MIVF: IVF dc'd Surgeries / Procedures:   -S/p Exploratory laparotomy with partial colectomy and end colostomy 5/5 Dr. 7/7 GI Imaging: CT 5/12 -  diffuse inflammatory wall thickening throughout the colon with diffuse fecal material identified, free fluid in the pelvis but no definitive abscess.  Central access: PICC 5/6 for TPN TPN start date: 01/19/2020  Nutritional Goals (per RD recommendation updated on 01/23/2020): kCal: 1379, Protein: 114-130, Fluid: >= 1.3 L/day  Goal TPN rate is 80 mL/hr (provides 115 g of protein and ~1400 kcals per day) with lipids  Current Nutrition:  TPN at 80 ml/hr Tube feed at 30 ml/hr + Pro-Stat delivers 63 gm protein, 720 kCal Goal rate Tube feed 55 ml/hr + Pro-Stat delivers 129 gm protein, 1784  kCal  Plan:  Decrease TPN rate to 40 ml/hr to provide 58g protein, total kcal 700. SMOF lipid emulsion added 5/12, had been held for first 7 days for critically ill patients per ASPEN guidelines Electrolytes in TPN: 80mEq/L of Na, 38mEq/L of K, 24mEq/L of Ca, 50mEq/L of Mg, 40mmol/L of Phos . Cl:Ac ratio 1:1 Standard MVI and trace elements daily Continue q4h SSI, adjust to standard scale   Pepcid 40 mg/day in TPN - change to via tube daily tomorrow 5/15 Monitor TPN labs on Mon/Thurs, check BMET/Mg/Phos in AM  6/15 PharmD 01/27/2020, 11:20 AM  Dedicated ICU phone [phone] till 2:30pm

## 2020-01-27 NOTE — Progress Notes (Signed)
    Husband upset -> so I called him. He is very upset that she is on lot of methadone here. At home total max 10mg  per day. Currently lot more than that. He insists on dc of methadone  Discuss need for trach. - he does not want to deal with question right now but wants me to stop methadone before deciding on timing of trach  Plan  - dc methadone   - address trach question when he is ready     SIGNATURE    Dr. , M.D., F.C.C.P,  Pulmonary and Critical Care Medicine Staff Physician, [facility] Center Director - Interstitial Lung Disease  Program  Pulmonary Fibrosis [facility] at [facility] [place], [place], [place]  Pager: [phone], If no answer or between  15:00h - 7:00h: call [phone] Telephone: [phone]  5:38 PM 01/27/2020 \

## 2020-01-27 NOTE — Progress Notes (Signed)
Call from the nurse.  She came in with me at the bedside.  Patient is still febrile despite appropriate antibiotic for Klebsiella.  Plan -Okay for cooling blanket -Check lactic acid -Any issues overnight nursing to contact electronic ICU     SIGNATURE    Dr. Kalman Shan, M.D., F.C.C.P,  Pulmonary and Critical Care Medicine Staff Physician, [facility] Center Director - Interstitial Lung Disease  Program  Pulmonary Fibrosis [facility] at [facility] [place], [place], [address]  Pager: [phone], If no answer or between  15:00h - 7:00h: call [phone] Telephone: [phone]  6:44 PM 01/27/2020

## 2020-01-27 NOTE — Progress Notes (Addendum)
NAME:  Darlene Castillo, MRN:  [id number], DOB:  [birth year], LOS: 9 ADMISSION DATE:  01/18/2020, CONSULTATION DATE:  01/18/20 REFERRING MD:  Harriette Bouillon CHIEF COMPLAINT:  VDRF, septic shock  Brief History   Peritonitis 2/2 sigmoid colon perforation s/p ex-lap 5/5 with resection and end colostomy.  History of present illness   Darlene Castillo is a 59 yobf smoker  who presented early am 5/5  with abdominal pain, constipation, one episode of vomiting and was found to have a perforated sigmoid colon and peritonitis.  She went to the OR for ex lap and return to the ICU intubated.  Past Medical History  Chronic opiate use Tobacco abuse DM HTN  Significant Hospital Events   5/5 ex lap with partial colectomy and end colostomy for perf:  neg path   01/25/2020: Extubated yesterday evening but in significant respiratory distress and fatigue this morning.  Overnight decided on DO NOT INTUBATE status but husband questioning her capacity this morning.  In conversation with the patient it appears she does not have capacity.  She is not able to comprehend my conversation about intubation.  Husband reports patient is muslim and has intense desire to live.  He does not feel that patient's decision on no intubation is consistent with her values.  He also states it is not in her best interest.   Had a temperature and per CCS has CT scan abdomen did  not find any abscess.  Panculture required. Labs probably not accurate - drawn from near TPN  - likely per pharmacist. Needs repeat lab draw  5/13  Back on vent. fever curve better. PCT down to 3.4. Mag 1.8. Concern for A Fib but EKG sinus tach with PAC. Down to 40% fio2 . TF on hold but getting meds via NG. On TPN., On dilaudid gtt, scheduled methadone, versed gtt, clonidine, seroquel and nicotine patch. Started on IV heparin for RUE DVT   5/14 dc versed gtt. Working on weaning sedation. Changing abx to rocephin Consults:  CCS PCCM  Procedures:  5/5 CVL RIJ 5/5 A-line  5/7  5/5 ET 5/6 PIC L brachial   Significant Diagnostic Tests:  CT abdomen pelvis with contrast 01/17/2020-scattered groundglass opacities in the lower lungs bilaterally, no focal opacities.  Abdominal free air.  Multiple dilated loops of bowel with stool.  Large R renal cyst. Path 5/5 >>> - Perforation with acute inflammation and acute serositis. No evidence of malignancy.    Micro Data:  Covid negative FLu PCR - neg xxxxxxxxxx MRSA PCR 5/12 - neg Tracheal aspirate 01/25/20 - klebsiella  Urine analysis 01/25/2020  - no growth Blood culture 01/25/2020>>>   Antimicrobials:  Zosyn 5/5  >>5/14 Eraxis 5/12 >>5/14 Rocephin 5/14>>>  Interim history/subjective:  Oversedated   Objective   Blood pressure 133/63, pulse (Abnormal) 107, temperature (Abnormal) 101.9 F (38.8 C), temperature source Oral, resp. rate (Abnormal) 28, height 5\' 1"  (1.549 m), weight 76.4 kg, last menstrual period 05/23/2013, SpO2 100 %.    Vent Mode: PRVC FiO2 (%):  [30 %-40 %] 40 % Set Rate:  [26 bmp] 26 bmp Vt Set:  [330 mL] 330 mL PEEP:  [5 cmH20] 5 cmH20 Plateau Pressure:  [19 cmH20-25 cmH20] 24 cmH20   Intake/Output Summary (Last 24 hours) at 01/27/2020 1030 Last data filed at 01/27/2020 0800 Gross per 24 hour  Intake 2745.62 ml  Output 1800 ml  Net 945.62 ml   Filed Weights   01/18/20 1300 01/26/20 0932 01/27/20 0424  Weight: 67 kg 71.9  kg 76.4 kg   General 59 year old black female she is currently heavily sedated, no acute distress HEENT normocephalic atraumatic pupils equal reactive nonicteric orally intubated Pulmonary: Clear to auscultation diminished bases no accessory use Cardiac: Regular rate and rhythm Abdomen: Wound VAC intact, ostomy pink, positive bowel sounds, tolerating tube feeds Remedies: Warm dry brisk capillary refill Neuro: Currently heavily sedated GU: Clear yellow.   Resolved problems   Circulatory/septic shock   Assessment & Plan:  LOS 9 days   Acute hypoxic  respiratory failure requiring mechanical ventilation + Klebsiella PNA  pcxr personally reviewed: bilateral patchy airspace disease involving the RUL and L perihilar area. Not much change when comparing prior film Plan Cont full vent support  Lasix X 1  Cont abx (changing to CTx as her klebsiella was intermittently resistant to zosyn) Repeat cxr in am  PAD protocol. (seems like sedation level will be a barrier to extubation at this point) VAP bundle   Acute encephalopathy started 5/12, continues 5/14 Chronic pain needs on oral methadone program 01/26/20 - improved orientation but on scheduled , On dilaudid gtt, scheduled methadone, versed gtt, clonidine, seroquel and nicotine patch Plan PAD protocol RASS goal -1 Cont precedex, dilaudid and try to wean versed off.  Scheduled methadone and seroquel -->watching QTc and increasing to 50mg  q hs Cont clonidine incr to 0.2  Peritonitis 2/2 sigmoid colon perforation from chronic constipation - s/p ex lap with sigmoid resection and colostomy. Plan Cont wound care.  Stopping Zosyn and transitioning to CTX as surg does not think abd source of fever of infection at this point.  Dc Eraxis   DVT right brachial vein Plan Cont therapeutic AC  Malnutrition. Expected prolonged ileus post op. Plan Advancing tube feeds Cont TNA  Intermittent Fluid and Electrolyte imbalance Plan Cont to monitor & replace and address as indicated   Hyperglycemia Plan ssi   Best practice:  Diet: NPO.  TPN for now. TF on hold Pain/Anxiety/Delirium protocol (if indicated): see above VAP protocol (if indicated): In place. DVT prophylaxis: SCDs and lovenox. GI prophylaxis: pepcid. Glucose control: SSI. Mobility: bedrest. Code Status: full. Family Communication: husband at bedside by RN - patient heading towards trach Disposition: ICU.  My cct 37 min  Darlene Castillo ACNP-BC [place] Pager # [phone] OR # [phone] if no  answer

## 2020-01-27 NOTE — Progress Notes (Signed)
Md informed that husband was upset about pt. Receiving too much methadone. Family was assured that we are working with family and wanted to make sure that we honored their wishes for the patient. Family did not really want to hear any rational thought from RN at the time. MD and RN should re-assess tomorrow and figure out exactly what the family wants. MD called husband and had a conversation in result he d/c methadone and will speak with family tomorrow.

## 2020-01-27 NOTE — Consult Note (Addendum)
WOC Nurse ostomy follow up Pt is intubated and sedated, no family members at the bedside.   Stoma type/location: Stoma is red and viable, above skin level, 1 3/4 inches and oval Peristomal assessment: intact skin surrounding Small amt liquid brown stool in the pouch Ostomy pouching: 2pc.  Enrolled patient in [place] Secure Start Discharge program: Yes, previously Applied barrier ring and 2 piece pouch.  Extra supplies left at the bedside. WOC team will begin teaching sessions when stable and out of ICU.   Pt became very agitated during dressing change and attempted to extubate herself.  Extra medication given. Changed Vac dressing to midline full thickness abd wound, small amt yellow drainage. Applied one piece black foam to cont suction.Wound is beefy red with yellow adipose tissue in layers.  Applied barrier ring at lower edge of abd skin fold to attempt to maintain a seal. WOC will plan to change Q M/W/F. Cammie Mcgee MSN, RN, CWOCN, [place], CNS [phone]

## 2020-01-27 NOTE — Progress Notes (Signed)
[place] for Heparin Indication: DVT , R brachial  Allergies  Allergen Reactions  . Ibuprofen Itching   Patient Measurements: Height: 5' 1"  (154.9 cm) Weight: 76.4 kg (168 lb 6.9 oz) IBW/kg (Calculated) : 47.8 Heparin Dosing Weight: 67kg  Vital Signs: Temp: 101.5 F (38.6 C) (05/14 1600) Temp Source: Axillary (05/14 1600) BP: 120/63 (05/14 1700) Pulse Rate: 88 (05/14 1700)  Labs: Recent Labs    01/25/20 0904 01/25/20 0904 01/26/20 0031 01/26/20 0544 01/26/20 2100 01/27/20 0347 01/27/20 1055 01/27/20 1837  HGB 8.6*  --   --   --   --  7.7*  --   --   HCT 25.7*  --   --   --   --  23.6*  --   --   PLT 285  --   --   --   --  455*  --   --   HEPARINUNFRC  --   --   --   --  0.17*  --  0.13* 0.48  CREATININE 0.65   < > 0.65 0.55  --  0.73  --   --    < > = values in this interval not displayed.   Estimated Creatinine Clearance: 70.8 mL/min (by C-G formula based on SCr of 0.73 mg/dL).  Medical History: Past Medical History:  Diagnosis Date  . Blurred vision   . Cervicalgia   . Chronic pain   . Diabetes mellitus without complication (HCC)    diet controlled borderline  . Headache   . Hypertension   . Numbness and tingling    Medications:  Scheduled:  . albuterol  2.5 mg Nebulization Q6H  . chlorhexidine gluconate (MEDLINE KIT)  15 mL Mouth Rinse BID  . Chlorhexidine Gluconate Cloth  6 each Topical Daily  . cloNIDine  0.2 mg Per Tube Q6H  . feeding supplement (PRO-STAT SUGAR FREE 64)  30 mL Per Tube BID  . insulin aspart  2-6 Units Subcutaneous Q4H  . lactulose  30 g Oral BID  . mouth rinse  15 mL Mouth Rinse 10 times per day  . nicotine  7 mg Transdermal Daily  . polyethylene glycol  17 g Per Tube Daily  . QUEtiapine  50 mg Per Tube QHS  . sodium chloride flush  10-40 mL Intracatheter Q12H   Infusions:  . sodium chloride Stopped (01/25/20 2356)  . cefTRIAXone (ROCEPHIN)  IV Stopped (01/27/20 1302)  .  dexmedetomidine (PRECEDEX) IV infusion 1.2 mcg/kg/hr (01/27/20 1700)  . feeding supplement (VITAL AF 1.2 CAL) 1,000 mL (01/27/20 1959)  . heparin 1,700 Units/hr (01/27/20 1700)  . HYDROmorphone 4 mg/hr (01/27/20 1700)  . midazolam 2 mg/hr (01/27/20 1700)  . TPN ADULT (ION) 40 mL/hr at 01/27/20 1753   Assessment:  11 yoF s/p Sigmoid colon perforation secondary to chronic constipation and ulceration with feculent peritonitis and gross contamination of abdominal cavity -S/pExploratory laparotomy with partial colectomy and end colostomy5/5 Dr. Brantley Stage 5/13: begin IV Heparin for R brachial DVT at 1000 units/hr, no bolus per CCS  01/27/2020 Heparin level therapeutic 0.48 on 1700 units/hr; no bleeding or complications reported.  Goal of Therapy:  Heparin level 0.3-0.7 units/ml Monitor platelets by anticoagulation protocol: Yes    Plan:  Continue Heparin at 1700 units/hr Check level in 6 hr to confirm therapeutic Daily CBC, begin daily Hep level at steady state   Peggyann Juba, PharmD, BCPS Pharmacy: [phone] 01/27/2020,8:21 PM

## 2020-01-27 NOTE — Progress Notes (Signed)
Leann Mayweather RN wasted 50 ml of versed with Marisue Ivan RN at 1000 5/14

## 2020-01-27 NOTE — Progress Notes (Signed)
Nutrition Follow-up  DOCUMENTATION CODES:   Not applicable  INTERVENTION:  - increase TF to Vital High Protein @ 30 ml/hr via NGT at this time to provide 720 kcal, 63 grams protein, and 602 ml free water. - at 1800, change TF to Vital AF 1.2 @ 30 ml/hr to advance by 10 ml every 12 hours to reach goal rate of 55 ml/hr with 30 ml prostat BID. - at goal rate, this regimen will provide 1784 kcal (104% estimated kcal need), 129 grams protein, and 1070 ml free water. - TPN management and weaning per Pharmacist.   NUTRITION DIAGNOSIS:   Inadequate oral intake related to inability to eat as evidenced by NPO status. 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 CT revealed colonic perforation in sigmoid/rectosigmoid.  Significant Events: 5/5- admission; NGT placed in R nare; ex lap with partial colectomy and end colostomy  5/6- initial RD assessment; triple lumen PICC placed in L brachial; TPN initiation   Patient remains intubated with NGT in place and is receiving Vital High Protein @ 20 ml/hr which is providing 480 kcal, 42 grams protein, and 401 ml free water. She is also receiving custom TPN at 80 ml/hr which is providing 1401 kcal and 115 grams protein. Total nutrition support regimen is providing 1880 kcal (110% estimated kcal need) and 157 grams protein.  Able to talk with RN who reports NGT had become dislodged and that she replaced NGT a short time ago. TF was started back at 20 ml/hr after placement confirmed.  Order in place for Vital High Protein to be increased to 30 ml/hr tomorrow (5/15) at 1000. RN reports she was able to talk with Surgeon who informed her to increase TF as she feels appropriate.   Weight is +9.6 kg/20 lb compared to admission weight of 67 kg/148 lb. Admission weight used to re-estimate kcal need.   Patient is currently intubated on ventilator support MV: 9.8 L/min Temp (24hrs), Avg:99.8 F (37.7 C), Min:98.3 F (36.8 C), Max:101.9 F (38.8 C) Propofol: none  Labs reviewed; CBGs: 192 and 178 mg/dl, BUN: 27 mg/dl, Ca: 8.1 mg/dl, Mg: 2.5 mg/dl. Medications reviewed; sliding scale novolog, 30 g lacutlose BID, 2 g IV Mg sulfate x1 run 5/13, 1 packet miralax per tube/day. Drips; heparin @ 1200 units/hr, precedex @ 1.2 mcg/kg/hr, versed @ 2 mg/hr.    NUTRITION - FOCUSED PHYSICAL EXAM:  completed; no muscle or fat wasting noted, mild edema to BUE.   Diet Order:   Diet Order    None      EDUCATION NEEDS:   Not appropriate for education at this time  Skin:  Skin Assessment: Skin Integrity Issues: Skin Integrity Issues:: Incisions, Wound VAC Wound Vac: being emptied MWF Incisions: abdomen (5/5)  Last BM:  5/14 (50 ml via colostomy)  Height:   Ht Readings from Last 1 Encounters:  01/23/20 5' 1"  (1.549 m)    Weight:   Wt Readings from Last 1 Encounters:  01/27/20 76.4 kg    Estimated Nutritional Needs:  Kcal:  1710 kcal Protein:  114-130 Fluid:  >/= 1.8 L/day     Jarome Matin, MS, RD, LDN, CNSC Inpatient Clinical Dietitian RD pager # available in [facility]  After hours/weekend pager # available in [facility]

## 2020-01-27 NOTE — Progress Notes (Signed)
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 01/18/2020 Active Problems:   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   Methadone dependence (HCC)   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   Pneumoperitoneum   Postoperative acute respiratory failure (HCC)   Endotracheally intubated   Acute respiratory failure with hypoxemia (HCC)   Encephalopathy acute   Other chronic pain   Constipation, chronic   DM (diabetes mellitus), type 2, uncontrolled (HCC)   Hyperglycemia   9 Days Post-Op  01/18/2020  Preoperative diagnosis: Sigmoid colon perforation secondary to chronic constipation and ulceration with feculent peritonitis and Dior Stepter contamination of abdominal cavity with stool balls septic shock  Postoperative diagnosis: Same with significant fecal contamination class IV wound with free  stool balls floating throughout the abdominal cavity causing severe widespread peritonitis with septic shock  Procedure: Exploratory laparotomy with partial colectomy and end colostomy  Surgeon: Harriette Bouillon, MD      [facility] Stay = 9 days)  Assessment/Plan HTN Prediabetic- A1c6.3although glucose elevated >200,SSI Tobacco abuse Prior h/o heroin abuse at least 15 years ago, on methadone Chronic constipation - miralax per tube Malnutrition - prealbumin<5 (5/10), continueTPN VDRF, ?VAP-  extubated 5/11, reintubated 5/12. CCM discussing trach Fever - CT without definite IAA abscess 5/11, Ucx neg, Rcx GNR, Bcx pending. Continue broad spectrum abx. Will check dopplers as well  Septic shock Sigmoid colon perforation secondary to chronic constipation and ulceration with feculent peritonitis and Aylen Stradford contamination of abdominal cavity with stool balls -S/pExploratory laparotomy with partial colectomy and end colostomy5/5 Dr. Luisa Hart - POD# - surgical path:Perforation with acute inflammation and acute serositis, no evidence of malignancy -wound vac MWF - WOC RNfollowingfor new colostomyand vac - continue IV zosyn>/= 10 days for peritonitis - CT 5/11 showeddiffuse inflammatory wall thickening throughout the colon with diffuse fecal material identified, free fluid in the pelvis but no definitive abscess  ID -zosyn 5/5>>day#8, eraxis 5/12>>day# FEN -NPO/NGTto LIWS,TPN, trophic TF VTE -SCDs, lovenox Foley -d/c 5/13  Plan: Septic shock resolved.  No need for pressors at that time.  Follow-up  Full anticoagulation for upper extremity DVT.  Pharmacy in critical care help appreciated  With return of bowel function, advance tube feeds to goal gradually.  If end goal of tube feeds for more than 24 hours, can wean off TNA.  Vent settings per critical care medicine.  Help appreciated  Antibiotics per critical care medicine.  Most likely pulmonary etiology.  Increase bowel regimen with lactulose try and help clean out but not given her severe constipation  Continue wound VAC to help close.  Colostomy care.  Challenged with her agitation.  Try and sedate.  30 minutes spent in review, evaluation, examination, counseling, and coordination of care.  More than 50% of that time was spent in counseling.  01/27/2020    Subjective: (Chief complaint)  Duplex positive for extremity DVT.  Anticoagulating.  Agitated with wound VAC dressing change with wound  clear.  Having return of  bowel function with flatus and some liquid bowel movements.  ICU nursing and wound ostomy nursing in room.  Objective:  Vital signs:  Vitals:   01/27/20 0600 01/27/20 0630 01/27/20 0728 01/27/20 0800  BP: 123/64 118/61  133/63  Pulse: 87 85  (!) 107  Resp: (!) 30 (!) 28  (!) 28  Temp:    (!) 101.9 F (38.8 C)  TempSrc:    Oral  SpO2: 98% 97% 100% 100%  Weight:      Height:        Last BM Date: 01/26/20  Intake/Output   Yesterday:  05/13 0701 - 05/14 0700 In: 2749.8 [I.V.:2439.7; IV Piggyback:310.1] Out: 1750 [Urine:1700; Drains:50] This shift:  Total I/O In: 382.8 [I.V.:347; IV Piggyback:35.9] Out: 50 [Stool:50]  Bowel function:  Flatus: YES  BM:  YES  Drain: (No drain)   Physical Exam:  General: Pt intubated and sedated.  Easily aroused.  Not agitated right now. Eyes: PERRL, normal EOM.  Sclera clear.  No icterus Neuro: CN II-XII intact w/o focal sensory/motor deficits. Lymph: No head/neck/groin lymphadenopathy Psych:  No delerium/psychosis/paranoia.   HENT: Normocephalic, Mucus membranes moist.  No thrush.   ET tube and orogastric tube in place neck: Supple, No tracheal deviation.  No obvious thyromegaly Chest: No pain to chest wall compression.  Good respiratory excursion.  No audible wheezing CV:  Pulses intact.  Regular rhythm.  No major extremity edema MS: Normal AROM mjr joints.  No obvious deformity  Abdomen: Soft.  Moderately distended.  Nontender.  No evidence of peritonitis.  Left lower quadrant colostomy pink with minimal edema.  Viable.  Gas and liquid stool in bag.  Midline incision with wound VAC in place with some early granulation no incarcerated hernias.  Ext:  No deformity. 2+BLE  edema.  No cyanosis Skin: No petechiae / purpurea.  No major sores.  Warm and dry    Results:   Cultures: Recent Results (from the past 720 hour(s))  Respiratory Panel by RT PCR (Flu A&B, Covid) - Nasopharyngeal Swab     Status:  None   Collection Time: 01/18/20  8:12 AM   Specimen: Nasopharyngeal Swab  Result Value Ref Range Status   SARS Coronavirus 2 by RT PCR NEGATIVE NEGATIVE Final    Comment: (NOTE) SARS-CoV-2 target nucleic acids are NOT DETECTED. The SARS-CoV-2 RNA is generally detectable in upper respiratoy specimens during the acute phase of infection. The lowest concentration of SARS-CoV-2 viral copies this assay can detect is 131 copies/mL. A negative result does not preclude SARS-Cov-2 infection and should not be used as the sole basis for treatment or other patient management decisions. A negative result may occur with  improper specimen collection/handling, submission of specimen other than nasopharyngeal swab, presence of viral mutation(s) within the areas targeted by this assay, and inadequate number of viral copies (<131 copies/mL). A negative result must be combined with clinical observations, patient history, and epidemiological information. The expected result is Negative. Fact Sheet for Patients:  https://www.moore.com/ Fact Sheet for Healthcare Providers:  https://www.young.biz/ This test is not yet ap proved or cleared by the [place] FDA and  has been authorized for detection and/or diagnosis of SARS-CoV-2 by FDA under an Emergency Use Authorization (EUA). This EUA will remain  in effect (meaning this test can be used) for the duration of the COVID-19 declaration under Section 564(b)(1) of the Act, 21 U.S.C. section 360bbb-3(b)(1), unless the authorization is terminated or revoked sooner.    Influenza A by PCR NEGATIVE NEGATIVE  Final   Influenza B by PCR NEGATIVE NEGATIVE Final    Comment: (NOTE) The Xpert Xpress SARS-CoV-2/FLU/RSV assay is intended as an aid in  the diagnosis of influenza from Nasopharyngeal swab specimens and  should not be used as a sole basis for treatment. Nasal washings and  aspirates are unacceptable for Xpert  Xpress SARS-CoV-2/FLU/RSV  testing. Fact Sheet for Patients: https://www.moore.com/ Fact Sheet for Healthcare Providers: https://www.young.biz/ This test is not yet approved or cleared by the [place] FDA and  has been authorized for detection and/or diagnosis of SARS-CoV-2 by  FDA under an Emergency Use Authorization (EUA). This EUA will remain  in effect (meaning this test can be used) for the duration of the  Covid-19 declaration under Section 564(b)(1) of the Act, 21  U.S.C. section 360bbb-3(b)(1), unless the authorization is  terminated or revoked. Performed at [facility], [address] [place], [place] [address]   MRSA PCR Screening     Status: None   Collection Time: 01/18/20  9:13 PM   Specimen: Nasal Mucosa; Nasopharyngeal  Result Value Ref Range Status   MRSA by PCR NEGATIVE NEGATIVE Final    Comment:        The GeneXpert MRSA Assay (FDA approved for NASAL specimens only), is one component of a comprehensive MRSA colonization surveillance program. It is not intended to diagnose MRSA infection nor to guide or monitor treatment for MRSA infections. Performed at [facility], [address], [place], [place] [address]   Culture, respiratory (non-expectorated)     Status: None   Collection Time: 01/25/20  8:51 AM   Specimen: Tracheal Aspirate; Respiratory  Result Value Ref Range Status   Specimen Description   Final    TRACHEAL ASPIRATE Performed at [facility], [address] [place], [place] [address]    Special Requests   Final    NONE Performed at [facility], [address], [place], [place] [address]    Gram Stain   Final    RARE WBC PRESENT, PREDOMINANTLY PMN NO ORGANISMS SEEN Performed at [facility], [address], [place], [place] [address]    Culture FEW KLEBSIELLA PNEUMONIAE  Final   Report Status 01/27/2020 FINAL   Final   Organism ID, Bacteria KLEBSIELLA PNEUMONIAE  Final      Susceptibility   Klebsiella pneumoniae - MIC*    AMPICILLIN >=32 RESISTANT Resistant     CEFAZOLIN <=4 SENSITIVE Sensitive     CEFEPIME <=1 SENSITIVE Sensitive     CEFTAZIDIME <=1 SENSITIVE Sensitive     CEFTRIAXONE <=1 SENSITIVE Sensitive     CIPROFLOXACIN <=0.25 SENSITIVE Sensitive     GENTAMICIN <=1 SENSITIVE Sensitive     IMIPENEM <=0.25 SENSITIVE Sensitive     TRIMETH/SULFA <=20 SENSITIVE Sensitive     AMPICILLIN/SULBACTAM >=32 RESISTANT Resistant     PIP/TAZO 32 INTERMEDIATE Intermediate     * FEW KLEBSIELLA PNEUMONIAE  Culture, Urine     Status: None   Collection Time: 01/25/20  9:04 AM   Specimen: Urine, Catheterized  Result Value Ref Range Status   Specimen Description   Final    URINE, CATHETERIZED Performed at [facility], [address] [place], [place] [address]    Special Requests   Final    NONE Performed at [facility], [address], [place], [place] [address]    Culture   Final    NO GROWTH Performed at [facility], [address], [place], [place] [address]    Report Status 01/26/2020 FINAL  Final  Culture, blood (routine x 2)     Status: None (Preliminary result)   Collection Time: 01/25/20 10:22 AM   Specimen: BLOOD RIGHT HAND  Result Value Ref Range Status   Specimen Description   Final    BLOOD RIGHT HAND Performed at [facility], [address] [place], [place] [address]    Special Requests   Final    BOTTLES DRAWN AEROBIC ONLY Blood Culture adequate volume Performed at [facility], [address] [place], [place] [address]    Culture   Final    NO GROWTH < 24 HOURS Performed at [facility], [address], [place], [place] [address]    Report Status PENDING  Incomplete  Culture, blood (routine x 2)     Status: None (Preliminary result)   Collection Time: 01/25/20 10:22 AM    Specimen: BLOOD  Result Value Ref Range Status   Specimen Description   Final    BLOOD RIGHT ARM Performed at [facility], [address] [place], [place] [address]    Special Requests   Final    BOTTLES DRAWN AEROBIC ONLY Blood Culture adequate volume Performed at [facility], [address] [place], [place] [address]    Culture   Final    NO GROWTH < 24 HOURS Performed at [facility], [address], [place], [place] [address]    Report Status PENDING  Incomplete    Labs: Results for orders placed or performed during the hospital encounter of 01/18/20 (from the past 48 hour(s))  Culture, blood (routine x 2)     Status: None (Preliminary result)   Collection Time: 01/25/20 10:22 AM   Specimen: BLOOD RIGHT HAND  Result Value Ref Range   Specimen Description      BLOOD RIGHT HAND Performed at [facility], [address], [place], [place] [address]    Special Requests      BOTTLES DRAWN AEROBIC ONLY Blood Culture adequate volume Performed at [facility], [address] [place], [place] [address]    Culture      NO GROWTH < 24 HOURS Performed at [facility], [address] [place], [place] [address]    Report Status PENDING   Culture, blood (routine x 2)     Status: None (Preliminary result)   Collection Time: 01/25/20 10:22 AM   Specimen: BLOOD  Result Value Ref Range   Specimen Description      BLOOD RIGHT ARM Performed at [facility], [address] [place], [place] [address]    Special Requests      BOTTLES DRAWN AEROBIC ONLY Blood Culture adequate volume Performed at [facility], [address] [place], [place] [address]    Culture      NO GROWTH < 24 HOURS Performed at [facility], [address] [place], [place] [address]    Report Status PENDING   Glucose, capillary     Status: Abnormal   Collection Time:  01/25/20 11:19 AM  Result Value Ref Range   Glucose-Capillary 195 (H) 70 - 99 mg/dL    Comment: Glucose reference range applies only to samples taken after fasting for at least 8 hours.   Comment 1 Notify RN    Comment 2 Document in Chart   Glucose,  capillary     Status: Abnormal   Collection Time: 01/25/20  3:23 PM  Result Value Ref Range   Glucose-Capillary 147 (H) 70 - 99 mg/dL    Comment: Glucose reference range applies only to samples taken after fasting for at least 8 hours.   Comment 1 Notify RN    Comment 2 Document in Chart   Glucose, capillary     Status: Abnormal   Collection Time: 01/25/20  7:51 PM  Result Value Ref Range   Glucose-Capillary 152 (H) 70 - 99 mg/dL    Comment: Glucose reference range applies only to samples taken after fasting for at least 8 hours.  Glucose, capillary     Status: Abnormal   Collection Time: 01/25/20 11:34 PM  Result Value Ref Range   Glucose-Capillary 160 (H) 70 - 99 mg/dL    Comment: Glucose reference range applies only to samples taken after fasting for at least 8 hours.  Basic metabolic panel     Status: Abnormal   Collection Time: 01/26/20 12:31 AM  Result Value Ref Range   Sodium 139 135 - 145 mmol/L   Potassium 3.9 3.5 - 5.1 mmol/L   Chloride 105 98 - 111 mmol/L   CO2 27 22 - 32 mmol/L   Glucose, Bld 180 (H) 70 - 99 mg/dL    Comment: Glucose reference range applies only to samples taken after fasting for at least 8 hours.   BUN 19 6 - 20 mg/dL   Creatinine, Ser 3.90 0.44 - 1.00 mg/dL   Calcium 7.8 (L) 8.9 - 10.3 mg/dL   GFR calc non Af Amer >60 >60 mL/min   GFR calc Af Amer >60 >60 mL/min   Anion gap 7 5 - 15    Comment: Performed at [facility], [address] [place], [place] [address]  Magnesium     Status: None   Collection Time: 01/26/20 12:31 AM  Result Value Ref Range   Magnesium 1.9 1.7 - 2.4 mg/dL    Comment: Performed at [facility], [address] [place], [place]  [address]  Glucose, capillary     Status: Abnormal   Collection Time: 01/26/20  4:52 AM  Result Value Ref Range   Glucose-Capillary 167 (H) 70 - 99 mg/dL    Comment: Glucose reference range applies only to samples taken after fasting for at least 8 hours.  Comprehensive metabolic panel     Status: Abnormal   Collection Time: 01/26/20  5:44 AM  Result Value Ref Range   Sodium 139 135 - 145 mmol/L   Potassium 4.1 3.5 - 5.1 mmol/L   Chloride 101 98 - 111 mmol/L   CO2 27 22 - 32 mmol/L   Glucose, Bld 183 (H) 70 - 99 mg/dL    Comment: Glucose reference range applies only to samples taken after fasting for at least 8 hours.   BUN 17 6 - 20 mg/dL   Creatinine, Ser 6.22 0.44 - 1.00 mg/dL   Calcium 8.1 (L) 8.9 - 10.3 mg/dL   Total Protein 6.0 (L) 6.5 - 8.1 g/dL   Albumin 1.9 (L) 3.5 - 5.0 g/dL   AST 33 15 - 41 U/L   ALT 26 0 - 44 U/L   Alkaline Phosphatase 203 (H) 38 - 126 U/L   Total Bilirubin 0.4 0.3 - 1.2 mg/dL   GFR calc non Af Amer >60 >60 mL/min   GFR calc Af Amer >60 >60 mL/min   Anion gap 11 5 -  15    Comment: Performed at [facility], [address] [place], [place] [id number]  Magnesium     Status: None   Collection Time: 01/26/20  5:44 AM  Result Value Ref Range   Magnesium 1.8 1.7 - 2.4 mg/dL    Comment: Performed at [facility], [address] [place], [place] [address]  Phosphorus     Status: None   Collection Time: 01/26/20  5:44 AM  Result Value Ref Range   Phosphorus 3.1 2.5 - 4.6 mg/dL    Comment: Performed at [facility], [address] [place], [place] [address]  Procalcitonin     Status: None   Collection Time: 01/26/20  5:44 AM  Result Value Ref Range   Procalcitonin 3.40 ng/mL    Comment:        Interpretation: PCT > 2 ng/mL: Systemic infection (sepsis) is likely, unless other causes are known. (NOTE)       Sepsis PCT Algorithm           Lower Respiratory Tract                                       Infection PCT Algorithm    ----------------------------     ----------------------------         PCT < 0.25 ng/mL                PCT < 0.10 ng/mL         Strongly encourage             Strongly discourage   discontinuation of antibiotics    initiation of antibiotics    ----------------------------     -----------------------------       PCT 0.25 - 0.50 ng/mL            PCT 0.10 - 0.25 ng/mL               OR       >80% decrease in PCT            Discourage initiation of                                            antibiotics      Encourage discontinuation           of antibiotics    ----------------------------     -----------------------------         PCT >= 0.50 ng/mL              PCT 0.26 - 0.50 ng/mL               AND       <80% decrease in PCT              Encourage initiation of                                             antibiotics       Encourage continuation           of antibiotics    ----------------------------     -----------------------------  PCT >= 0.50 ng/mL                  PCT > 0.50 ng/mL               AND         increase in PCT                  Strongly encourage                                      initiation of antibiotics    Strongly encourage escalation           of antibiotics                                     -----------------------------                                           PCT <= 0.25 ng/mL                                                 OR                                        > 80% decrease in PCT                                     Discontinue / Do not initiate                                             antibiotics Performed at [facility], [address], [place], [place] [address]   Glucose, capillary     Status: Abnormal   Collection Time: 01/26/20  7:39 AM  Result Value Ref Range   Glucose-Capillary 167 (H) 70 - 99 mg/dL    Comment: Glucose reference range applies only to samples taken after fasting for at  least 8 hours.   Comment 1 Notify RN    Comment 2 Document in Chart   Glucose, capillary     Status: Abnormal   Collection Time: 01/26/20 12:14 PM  Result Value Ref Range   Glucose-Capillary 161 (H) 70 - 99 mg/dL    Comment: Glucose reference range applies only to samples taken after fasting for at least 8 hours.   Comment 1 Notify RN    Comment 2 Document in Chart   Glucose, capillary     Status: Abnormal   Collection Time: 01/26/20  3:39 PM  Result Value Ref Range   Glucose-Capillary 183 (H) 70 - 99 mg/dL    Comment: Glucose reference range applies only to samples taken after fasting for at least 8 hours.   Comment 1 Notify RN    Comment 2 Document in Chart  Glucose, capillary     Status: Abnormal   Collection Time: 01/26/20  7:36 PM  Result Value Ref Range   Glucose-Capillary 174 (H) 70 - 99 mg/dL    Comment: Glucose reference range applies only to samples taken after fasting for at least 8 hours.  Heparin level (unfractionated)     Status: Abnormal   Collection Time: 01/26/20  9:00 PM  Result Value Ref Range   Heparin Unfractionated 0.17 (L) 0.30 - 0.70 IU/mL    Comment: (NOTE) If heparin results are below expected values, and patient dosage has  been confirmed, suggest follow up testing of antithrombin III levels. Performed at [facility], [address] [place], [place] [address]   Glucose, capillary     Status: Abnormal   Collection Time: 01/26/20 11:27 PM  Result Value Ref Range   Glucose-Capillary 168 (H) 70 - 99 mg/dL    Comment: Glucose reference range applies only to samples taken after fasting for at least 8 hours.  Glucose, capillary     Status: Abnormal   Collection Time: 01/27/20  3:43 AM  Result Value Ref Range   Glucose-Capillary 192 (H) 70 - 99 mg/dL    Comment: Glucose reference range applies only to samples taken after fasting for at least 8 hours.  Procalcitonin     Status: None   Collection Time: 01/27/20  3:47 AM  Result Value  Ref Range   Procalcitonin 3.06 ng/mL    Comment:        Interpretation: PCT > 2 ng/mL: Systemic infection (sepsis) is likely, unless other causes are known. (NOTE)       Sepsis PCT Algorithm           Lower Respiratory Tract                                      Infection PCT Algorithm    ----------------------------     ----------------------------         PCT < 0.25 ng/mL                PCT < 0.10 ng/mL         Strongly encourage             Strongly discourage   discontinuation of antibiotics    initiation of antibiotics    ----------------------------     -----------------------------       PCT 0.25 - 0.50 ng/mL            PCT 0.10 - 0.25 ng/mL               OR       >80% decrease in PCT            Discourage initiation of                                            antibiotics      Encourage discontinuation           of antibiotics    ----------------------------     -----------------------------         PCT >= 0.50 ng/mL              PCT 0.26 - 0.50 ng/mL  AND       <80% decrease in PCT              Encourage initiation of                                             antibiotics       Encourage continuation           of antibiotics    ----------------------------     -----------------------------        PCT >= 0.50 ng/mL                  PCT > 0.50 ng/mL               AND         increase in PCT                  Strongly encourage                                      initiation of antibiotics    Strongly encourage escalation           of antibiotics                                     -----------------------------                                           PCT <= 0.25 ng/mL                                                 OR                                        > 80% decrease in PCT                                     Discontinue / Do not initiate                                             antibiotics Performed at [facility], [address], [place], [place] [address]   CBC     Status: Abnormal   Collection Time: 01/27/20  3:47 AM  Result Value Ref Range   WBC 15.8 (H) 4.0 - 10.5 K/uL   RBC 2.83 (L) 3.87 - 5.11 MIL/uL   Hemoglobin 7.7 (L) 12.0 - 15.0 g/dL   HCT 60.4 (L) 54.0 - 98.1 %   MCV 83.4 80.0 - 100.0 fL   MCH 27.2 26.0 - 34.0 pg   MCHC 32.6 30.0 - 36.0 g/dL   RDW 19.1 47.8 -  15.5 %   Platelets 455 (H) 150 - 400 K/uL   nRBC 0.3 (H) 0.0 - 0.2 %    Comment: Performed at [facility], [address], [place], [place] [address]  Basic metabolic panel     Status: Abnormal   Collection Time: 01/27/20  3:47 AM  Result Value Ref Range   Sodium 137 135 - 145 mmol/L   Potassium 4.6 3.5 - 5.1 mmol/L   Chloride 103 98 - 111 mmol/L   CO2 26 22 - 32 mmol/L   Glucose, Bld 217 (H) 70 - 99 mg/dL    Comment: Glucose reference range applies only to samples taken after fasting for at least 8 hours.   BUN 27 (H) 6 - 20 mg/dL   Creatinine, Ser 6.04 0.44 - 1.00 mg/dL   Calcium 8.1 (L) 8.9 - 10.3 mg/dL   GFR calc non Af Amer >60 >60 mL/min   GFR calc Af Amer >60 >60 mL/min   Anion gap 8 5 - 15    Comment: Performed at [facility], [address] [place], [place] [address]  Magnesium     Status: Abnormal   Collection Time: 01/27/20  3:47 AM  Result Value Ref Range   Magnesium 2.5 (H) 1.7 - 2.4 mg/dL    Comment: Performed at [facility], [address], [place], [place] [address]  Phosphorus     Status: None   Collection Time: 01/27/20  3:47 AM  Result Value Ref Range   Phosphorus 4.2 2.5 - 4.6 mg/dL    Comment: Performed at [facility], [address] [place], [place] [address]  Glucose, capillary     Status: Abnormal   Collection Time: 01/27/20  8:20 AM  Result Value Ref Range   Glucose-Capillary 178 (H) 70 - 99 mg/dL    Comment: Glucose reference range applies only to samples taken after fasting for at least 8 hours.   Comment 1  Notify RN    Comment 2 Document in Chart     Imaging / Studies: DG Abd 1 View  Result Date: 01/27/2020 CLINICAL DATA:  New enteric tube placement. EXAM: ABDOMEN - 1 VIEW COMPARISON:  Abdominal x-ray dated Jan 25, 2020. FINDINGS: Enteric tube in the stomach. The bowel gas pattern is normal. Stool in the colon. No radio-opaque calculi or other significant radiographic abnormality are seen. IMPRESSION: Enteric tube in the stomach. Electronically Signed   By: Obie Dredge M.D.   On: 01/27/2020 08:52   DG CHEST PORT 1 VIEW  Result Date: 01/27/2020 CLINICAL DATA:  Respiratory failure. EXAM: PORTABLE CHEST 1 VIEW COMPARISON:  Chest x-ray from yesterday. FINDINGS: Unchanged endotracheal and enteric tubes. Unchanged left upper extremity PICC line. Stable cardiomediastinal silhouette with normal heart size. Relatively unchanged patchy airspace disease in both lungs, most prominent in the right upper lobe and left parahilar lung. Mildly improved aeration at the left lung base. No pneumothorax or large pleural effusion. No acute osseous abnormality. IMPRESSION: 1. Mildly improved aeration at the left lung base. Otherwise unchanged bilateral airspace disease. 2. Stable lines and tubes. Electronically Signed   By: Obie Dredge M.D.   On: 01/27/2020 06:43   DG CHEST PORT 1 VIEW  Result Date: 01/26/2020 CLINICAL DATA:  Acute respiratory failure. EXAM: PORTABLE CHEST 1 VIEW COMPARISON:  Chest x-ray from yesterday. FINDINGS: Unchanged endotracheal and enteric tubes. Unchanged left upper extremity PICC line. Stable cardiomediastinal silhouette with normal heart size. Patchy airspace disease in both  lungs, worsened at the left lung base. No pneumothorax or large pleural effusion. No acute osseous abnormality. IMPRESSION: 1. Bilateral airspace disease, worsened in the left lower lobe, stable elsewhere. 2. Stable lines and tubes. Electronically Signed   By: Obie Dredge M.D.   On: 01/26/2020 07:09   DG Abd  Portable 1V  Result Date: 01/25/2020 CLINICAL DATA:  Check gastric catheter placement EXAM: PORTABLE ABDOMEN - 1 VIEW COMPARISON:  None. FINDINGS: Gastric catheter is noted within the mid stomach. Scattered large and small bowel gas is noted. Fecal material is noted within the transverse colon. No free air is seen. IMPRESSION: Gastric catheter within the stomach. Electronically Signed   By: Alcide Clever M.D.   On: 01/25/2020 21:22   VAS [place] LOWER EXTREMITY VENOUS (DVT)  Result Date: 01/26/2020  Lower Venous DVTStudy Indications: FUO.  Risk Factors: None identified. Limitations: Poor ultrasound/tissue interface and patient immobility. Comparison Study: No prior studies Performing Technologist: Chanda Busing RVT  Examination Guidelines: A complete evaluation includes B-mode imaging, spectral Doppler, color Doppler, and power Doppler as needed of all accessible portions of each vessel. Bilateral testing is considered an integral part of a complete examination. Limited examinations for reoccurring indications may be performed as noted. The reflux portion of the exam is performed with the patient in reverse Trendelenburg.  +---------+---------------+---------+-----------+----------+--------------+ RIGHT    CompressibilityPhasicitySpontaneityPropertiesThrombus Aging +---------+---------------+---------+-----------+----------+--------------+ CFV      Full           Yes      Yes                                 +---------+---------------+---------+-----------+----------+--------------+ SFJ      Full                                                        +---------+---------------+---------+-----------+----------+--------------+ FV Prox  Full                                                        +---------+---------------+---------+-----------+----------+--------------+ FV Mid   Full                                                         +---------+---------------+---------+-----------+----------+--------------+ FV DistalFull                                                        +---------+---------------+---------+-----------+----------+--------------+ PFV      Full                                                        +---------+---------------+---------+-----------+----------+--------------+ POP  Full           Yes      Yes                                 +---------+---------------+---------+-----------+----------+--------------+ PTV      Full                                                        +---------+---------------+---------+-----------+----------+--------------+ PERO     Full                                                        +---------+---------------+---------+-----------+----------+--------------+   +---------+---------------+---------+-----------+----------+--------------+ LEFT     CompressibilityPhasicitySpontaneityPropertiesThrombus Aging +---------+---------------+---------+-----------+----------+--------------+ CFV      Full           Yes      Yes                                 +---------+---------------+---------+-----------+----------+--------------+ SFJ      Full                                                        +---------+---------------+---------+-----------+----------+--------------+ FV Prox  Full                                                        +---------+---------------+---------+-----------+----------+--------------+ FV Mid   Full                                                        +---------+---------------+---------+-----------+----------+--------------+ FV DistalFull                                                        +---------+---------------+---------+-----------+----------+--------------+ PFV      Full                                                         +---------+---------------+---------+-----------+----------+--------------+ POP      Full           Yes      Yes                                 +---------+---------------+---------+-----------+----------+--------------+  PTV      Full                                                        +---------+---------------+---------+-----------+----------+--------------+ PERO     Full                                                        +---------+---------------+---------+-----------+----------+--------------+     Summary: RIGHT: - There is no evidence of deep vein thrombosis in the lower extremity.  - No cystic structure found in the popliteal fossa.  LEFT: - There is no evidence of deep vein thrombosis in the lower extremity.  - No cystic structure found in the popliteal fossa.  *See table(s) above for measurements and observations. Electronically signed by Lemar Livings MD on 01/26/2020 at 5:41:30 PM.    Final    VAS [place] UPPER EXTREMITY VENOUS DUPLEX  Result Date: 01/26/2020 UPPER VENOUS STUDY  Indications: FUO Risk Factors: None identified. Limitations: Poor ultrasound/tissue interface, body habitus, bandages and patient immobility, ventilator. Comparison Study: No prior studies. Performing Technologist: Chanda Busing RVT  Examination Guidelines: A complete evaluation includes B-mode imaging, spectral Doppler, color Doppler, and power Doppler as needed of all accessible portions of each vessel. Bilateral testing is considered an integral part of a complete examination. Limited examinations for reoccurring indications may be performed as noted.  Right Findings: +----------+------------+---------+-----------+----------+-------+ RIGHT     CompressiblePhasicitySpontaneousPropertiesSummary +----------+------------+---------+-----------+----------+-------+ IJV           Full       Yes       Yes                      +----------+------------+---------+-----------+----------+-------+  Subclavian    Full       Yes       Yes                      +----------+------------+---------+-----------+----------+-------+ Axillary      Full       Yes       Yes                      +----------+------------+---------+-----------+----------+-------+ Brachial    Partial      No        No                Acute  +----------+------------+---------+-----------+----------+-------+ Radial        Full                                          +----------+------------+---------+-----------+----------+-------+ Ulnar         Full                                          +----------+------------+---------+-----------+----------+-------+ Cephalic      Full                                          +----------+------------+---------+-----------+----------+-------+  Basilic       Full                                          +----------+------------+---------+-----------+----------+-------+  Left Findings: +----------+------------+---------+-----------+----------+-------+ LEFT      CompressiblePhasicitySpontaneousPropertiesSummary +----------+------------+---------+-----------+----------+-------+ IJV           Full       Yes       Yes                      +----------+------------+---------+-----------+----------+-------+ Subclavian    Full       Yes       Yes                      +----------+------------+---------+-----------+----------+-------+ Axillary      Full       Yes       Yes                      +----------+------------+---------+-----------+----------+-------+ Brachial      Full       Yes       Yes                      +----------+------------+---------+-----------+----------+-------+ Radial        Full                                          +----------+------------+---------+-----------+----------+-------+ Ulnar         Full                                           +----------+------------+---------+-----------+----------+-------+ Cephalic      Full                                          +----------+------------+---------+-----------+----------+-------+ Basilic       Full                                          +----------+------------+---------+-----------+----------+-------+  Summary:  Right: No evidence of superficial vein thrombosis in the upper extremity. 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  Left: No evidence of deep vein thrombosis in the upper extremity. No evidence of superficial vein thrombosis in the upper extremity.  *See table(s) above for measurements and observations.  Diagnosing physician: Servando Snare MD Electronically signed by Servando Snare MD on 01/26/2020 at 5:42:02 PM.    Final     Medications / Allergies: per chart  Antibiotics: Anti-infectives (From admission, onward)   Start     Dose/Rate Route Frequency Ordered Stop   01/26/20 1000  anidulafungin (ERAXIS) 100 mg in sodium chloride 0.9 % 100 mL IVPB     100 mg 78 mL/hr over 100 Minutes Intravenous Every 24 hours 01/25/20 0956     01/25/20 1100  anidulafungin (ERAXIS) 200 mg in sodium chloride 0.9 % 200 mL IVPB  200 mg 78 mL/hr over 200 Minutes Intravenous  Once 01/25/20 1006 01/25/20 1525   01/18/20 1400  piperacillin-tazobactam (ZOSYN) IVPB 3.375 g     3.375 g 12.5 mL/hr over 240 Minutes Intravenous Every 8 hours 01/18/20 1311 01/28/20 1359   01/18/20 0715  piperacillin-tazobactam (ZOSYN) IVPB 3.375 g     3.375 g 100 mL/hr over 30 Minutes Intravenous  Once 01/18/20 0707 01/18/20 9371        Note: Portions of this report may have been transcribed using voice recognition software. Every effort was made to ensure accuracy; however, inadvertent computerized transcription errors may be present.   Any transcriptional errors that result from this process are unintentional.    Ardeth Sportsman, MD, FACS,  [facility] Gastrointestinal and Minimally Invasive Surgery  [facility] Surgery [address], [address] [place], [place] [address] [phone] Fax [phone] Main/Paging  CONTACT INFORMATION: Weekday (9AM-5PM) concerns: Call [facility] main office at [phone] Weeknight (5PM-9AM) or Weekend/Holiday concerns: Check [email or web address] for General Surgery [facility] coverage (Please, do not use SecureChat as it is not reliable communication to surgeons for patient care)      01/27/2020  9:32 AM

## 2020-01-27 NOTE — Progress Notes (Signed)
Pharmacy: [facility]  Patient's a 59 y.o F currently on heparin for R brachial CVT.  Heparin level is sub-therapeutic at 0.17 with current rate of 1000 units/hr.  Per pt's RN, no issues with IV line and no bleeding noted.  Goal of Therapy:  Heparin level 0.3-0.7 units/ml Monitor platelets by anticoagulation protocol: Yes  Plan: - increase heparin drip to 1200 units/hr - check 6 hr heparin level - monitor for s/sx  Dorna Leitz, PharmD, BCPS 01/27/2020 3:04 AM

## 2020-01-28 ENCOUNTER — Inpatient Hospital Stay (HOSPITAL_COMMUNITY): Payer: BLUE CROSS/BLUE SHIELD

## 2020-01-28 DIAGNOSIS — J150 Pneumonia due to Klebsiella pneumoniae: Secondary | ICD-10-CM

## 2020-01-28 DIAGNOSIS — E1165 Type 2 diabetes mellitus with hyperglycemia: Secondary | ICD-10-CM

## 2020-01-28 LAB — CBC
HCT: 22.8 % — ABNORMAL LOW (ref 36.0–46.0)
Hemoglobin: 7.5 g/dL — ABNORMAL LOW (ref 12.0–15.0)
MCH: 27.2 pg (ref 26.0–34.0)
MCHC: 32.9 g/dL (ref 30.0–36.0)
MCV: 82.6 fL (ref 80.0–100.0)
Platelets: 452 10*3/uL — ABNORMAL HIGH (ref 150–400)
RBC: 2.76 MIL/uL — ABNORMAL LOW (ref 3.87–5.11)
RDW: 14 % (ref 11.5–15.5)
WBC: 15.4 10*3/uL — ABNORMAL HIGH (ref 4.0–10.5)
nRBC: 0.3 % — ABNORMAL HIGH (ref 0.0–0.2)

## 2020-01-28 LAB — GLUCOSE, CAPILLARY
Glucose-Capillary: 161 mg/dL — ABNORMAL HIGH (ref 70–99)
Glucose-Capillary: 140 mg/dL — ABNORMAL HIGH (ref 70–99)
Glucose-Capillary: 142 mg/dL — ABNORMAL HIGH (ref 70–99)
Glucose-Capillary: 132 mg/dL — ABNORMAL HIGH (ref 70–99)
Glucose-Capillary: 152 mg/dL — ABNORMAL HIGH (ref 70–99)
Glucose-Capillary: 131 mg/dL — ABNORMAL HIGH (ref 70–99)

## 2020-01-28 LAB — HEPARIN LEVEL (UNFRACTIONATED): Heparin Unfractionated: 0.71 IU/mL — ABNORMAL HIGH (ref 0.30–0.70)

## 2020-01-28 MED ORDER — FAMOTIDINE 40 MG/5ML PO SUSR
40.0000 mg | Freq: Every day | ORAL | Status: DC
  Administered 2020-01-28 – 2020-02-09 (×12): 40 mg
  Filled 2020-01-28 (×14): qty 5

## 2020-01-28 MED ORDER — TRAVASOL 10 % IV SOLN
INTRAVENOUS | Status: AC
  Filled 2020-01-28: qty 576

## 2020-01-28 MED ORDER — HEPARIN (PORCINE) 25000 UT/250ML-% IV SOLN
1600.0000 [IU]/h | INTRAVENOUS | Status: DC
  Administered 2020-01-28: 1600 [IU]/h via INTRAVENOUS
  Filled 2020-01-28: qty 250

## 2020-01-28 MED ORDER — ENOXAPARIN SODIUM 80 MG/0.8ML ~~LOC~~ SOLN
80.0000 mg | Freq: Two times a day (BID) | SUBCUTANEOUS | Status: DC
  Administered 2020-01-28 – 2020-01-30 (×6): 80 mg via SUBCUTANEOUS
  Filled 2020-01-28 (×7): qty 0.8

## 2020-01-28 MED ORDER — FUROSEMIDE 10 MG/ML IJ SOLN
20.0000 mg | Freq: Once | INTRAMUSCULAR | Status: AC
  Administered 2020-01-28: 20 mg via INTRAVENOUS
  Filled 2020-01-28: qty 2

## 2020-01-28 MED ORDER — INSULIN ASPART 100 UNIT/ML ~~LOC~~ SOLN
4.0000 [IU] | SUBCUTANEOUS | Status: DC
  Administered 2020-01-28 – 2020-02-05 (×23): 4 [IU] via SUBCUTANEOUS

## 2020-01-28 NOTE — Progress Notes (Signed)
PHARMACY - TOTAL PARENTERAL NUTRITION CONSULT NOTE   Indication: Prolonged ileus  Patient Measurements: Height: 5\' 1"  (154.9 cm) Weight: 76.4 kg (168 lb 6.9 oz) IBW/kg (Calculated) : 47.8   Body mass index is 31.82 kg/m. Usual Weight: 67 kg  Assessment: 59 yo F s/p Sigmoid colon perforation secondary to chronic constipation and ulceration with feculent peritonitis and gross contamination of abdominal cavity with stool balls  -S/p Exploratory laparotomy with partial colectomy and end colostomy 5/5 Dr. 46 5/6 PICC placed and started TPN for nutrition support; expect prolonged ileus  Glucose / Insulin: prediabetes, A1c 6.3, CBGs higher with tube feed started 5/13, range 225 > 140, 28 units insulin/24 hr Electrolytes: last 5/14 Phos 4.2, Mag 1.8 > 2.5 after 2gm bolus (5/13) (goal >=2) ,  K 4.6 (goal >=4); CoCa 9.7 Renal: Scr WNL, 2.7 L charted UOP over past 24hr LFTs / TGs: WNL; Trig 27> 331> 434 > 270 > 210, propofol dc'd 5/7, Lipids now in formula SMOF lipid emulsion added 5/12, had been held for first 7 days for critically ill patients per ASPEN guidelines Prealbumin / albumin: prealbumin 9.7 5/6 > now < 5 reflects inflammation of post-op stress and critical illness, admit alb 4.5 WNL, BMI 27.9, well-nourished PTA, poor PO intake for 5 days PTA Intake / Output; MIVF: IVF dc'd Surgeries / Procedures:   -S/p Exploratory laparotomy with partial colectomy and end colostomy 5/5 Dr. 7/12 GI Imaging: CT 5/12 -  diffuse inflammatory wall thickening throughout the colon with diffuse fecal material identified, free fluid in the pelvis but no definitive abscess.  Central access: PICC 5/6 for TPN TPN start date: 01/19/2020  Nutritional Goals (per RD recommendation updated on 01/23/2020): kCal: 1379, Protein: 114-130, Fluid: >= 1.3 L/day  Goal TPN rate is 80 mL/hr (provides 115 g of protein and ~1400 kcals per day) with lipids  Current Nutrition:  TPN at 40 ml/hr Tube feed off since 5/14  at 9p - pump malfunction, resume 5/15 am Tube feed at 30 ml/hr + Pro-Stat delivers 63 gm protein, 720 kCal Goal rate Tube feed 55 ml/hr + Pro-Stat delivers 129 gm protein, 1784 kCal  Plan:  Continue TPN rate at 40 ml/hr to provide 58g protein, total kcal 700. Anticipate TF at goal in next 24 hr and d/c TPN 5/16 Electrolytes: 49mEq/L of Na, 3mEq/L of K, 35mEq/L of Ca, 37mEq/L of Mg, 21mmol/L of Phos . Cl:Ac ratio 1:1 Standard MVI and trace elements daily Continue q4h SSI, adjust to standard scale   Pepcid 40 mg/day (removed from TPN) to via tube daily Monitor TPN labs on Mon/Thurs, check BMET/Mg/Phos in AM  9m PharmD [phone] 01/28/2020, 10:36 AM  Dedicated ICU phone [phone] weekdays only till 2:30pm

## 2020-01-28 NOTE — Progress Notes (Signed)
[facility]:  [phone] General Surgery Progress Note   LOS: 10 days  POD -  10 Days Post-Op  Assessment and Plan: 1.  EXPLORATORY LAPAROTOMY SIGMOID RESECTION AND COLOSTOMY - 01/18/2020 - [place]  For sigmoid perforation  Rocephin  WBC - 15,400 - 01/27/2020  Has small amount of stool in colostomy bag   2.  VDRF, ?VAP- extubated 5/11, reintubated 5/12. CCM discussing trach 3.  HTN 4.  Prediabetic- A1c6.3although glucose elevated >200,SSI 5.  Tobacco abuse 6.  On methadone  Prior h/o heroin abuse at least 15 years ago 7.  Chronic constipation- miralax per tube 8.  Malnutrition - prealbumin<5 (5/10),   continueTPN - she is tolerating TF -  9.  DVT prophylaxis - Lovenox 10.  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  Does not respond to commands.  Daughter at bedside.  Objective:   Vitals:   01/28/20 0753 01/28/20 0835  BP:    Pulse:    Resp:    Temp:  98.1 F (36.7 C)  SpO2: 99%      Intake/Output from previous day:  05/14 0701 - 05/15 0700 In: 3500.8 [I.V.:2564.3; NG/GT:662.4; IV Piggyback:274] Out: 1875 [Urine:1500; Drains:50; Stool:325]  Intake/Output this shift:  No intake/output data recorded.   Physical Exam:   General: AA F who is intubated.  She is agitated.   Lungs: Rhonchi   Abdomen: Distended.  Quiet.   Wound: Midline wound with VAC.  Ostomy left abdomen with  some stool.   Lab Results:    Recent Labs    01/27/20 0347 01/28/20 0046  WBC 15.8* 15.4*  HGB 7.7* 7.5*  HCT 23.6* 22.8*  PLT 455* 452*    BMET   Recent Labs    01/26/20 0544 01/27/20 0347  NA 139 137  K 4.1 4.6  CL 101 103  CO2 27 26  GLUCOSE 183* 217*  BUN 17 27*  CREATININE 0.55 0.73  CALCIUM 8.1* 8.1*    PT/INR  No results for input(s): LABPROT, INR in the last 72 hours.  ABG   Recent Labs    01/25/20 0920  PHART 7.342*  HCO3 30.0*     Studies/Results:  DG Chest 1 View  Result Date: 01/27/2020 CLINICAL DATA:  Decreased oxygen saturation EXAM: CHEST  1 VIEW COMPARISON:  01/27/2020 FINDINGS: Endotracheal tube in good position. Left arm PICC tip in the right atrium. NG tube coiled in the stomach Diffuse bilateral airspace disease unchanged. No significant pleural fluid. IMPRESSION: Endotracheal tube in good position.  PICC tip in the right atrium. Diffuse bilateral airspace disease unchanged. Electronically Signed   By: Marlan Palau M.D.   On: 01/27/2020 14:54   DG Abd 1 View  Result Date: 01/27/2020 CLINICAL DATA:  New enteric tube placement. EXAM: ABDOMEN - 1 VIEW COMPARISON:  Abdominal x-ray dated Jan 25, 2020. FINDINGS: Enteric tube in the stomach. The bowel gas pattern is normal. Stool in the colon. No radio-opaque calculi  or other significant radiographic abnormality are seen. IMPRESSION: Enteric tube in the stomach. Electronically Signed   By: Obie Dredge M.D.   On: 01/27/2020 08:52   DG CHEST PORT 1 VIEW  Result Date: 01/27/2020 CLINICAL DATA:  Respiratory failure. EXAM: PORTABLE CHEST 1 VIEW COMPARISON:  Chest x-ray from yesterday. FINDINGS: Unchanged endotracheal and enteric tubes. Unchanged left upper extremity PICC line. Stable cardiomediastinal silhouette with normal heart size. Relatively unchanged patchy airspace disease in both lungs, most prominent in the right upper lobe and left parahilar lung. Mildly improved aeration at the left lung  base. No pneumothorax or large pleural effusion. No acute osseous abnormality. IMPRESSION: 1. Mildly improved aeration at the left lung base. Otherwise unchanged bilateral airspace disease. 2. Stable lines and tubes. Electronically Signed   By: Obie Dredge M.D.   On: 01/27/2020 06:43   VAS [place] LOWER EXTREMITY VENOUS (DVT)  Result Date: 01/26/2020  Lower Venous DVTStudy Indications: FUO.  Risk Factors: None identified. Limitations: Poor ultrasound/tissue interface and patient immobility. Comparison Study: No prior studies Performing Technologist: Chanda Busing RVT  Examination Guidelines: A complete evaluation includes B-mode imaging, spectral Doppler, color Doppler, and power Doppler as needed of all accessible portions of each vessel. Bilateral testing is considered an integral part of a complete examination. Limited examinations for reoccurring indications may be performed as noted. The reflux portion of the exam is performed with the patient in reverse Trendelenburg.  +---------+---------------+---------+-----------+----------+--------------+ RIGHT    CompressibilityPhasicitySpontaneityPropertiesThrombus Aging +---------+---------------+---------+-----------+----------+--------------+ CFV      Full           Yes      Yes                                 +---------+---------------+---------+-----------+----------+--------------+ SFJ      Full                                                        +---------+---------------+---------+-----------+----------+--------------+ FV Prox  Full                                                        +---------+---------------+---------+-----------+----------+--------------+ FV Mid   Full                                                        +---------+---------------+---------+-----------+----------+--------------+ FV DistalFull                                                         +---------+---------------+---------+-----------+----------+--------------+ PFV      Full                                                        +---------+---------------+---------+-----------+----------+--------------+  POP      Full           Yes      Yes                                 +---------+---------------+---------+-----------+----------+--------------+ PTV      Full                                                        +---------+---------------+---------+-----------+----------+--------------+ PERO     Full                                                        +---------+---------------+---------+-----------+----------+--------------+   +---------+---------------+---------+-----------+----------+--------------+ LEFT     CompressibilityPhasicitySpontaneityPropertiesThrombus Aging +---------+---------------+---------+-----------+----------+--------------+ CFV      Full           Yes      Yes                                 +---------+---------------+---------+-----------+----------+--------------+ SFJ      Full                                                        +---------+---------------+---------+-----------+----------+--------------+ FV Prox  Full                                                        +---------+---------------+---------+-----------+----------+--------------+ FV Mid   Full                                                        +---------+---------------+---------+-----------+----------+--------------+ FV DistalFull                                                        +---------+---------------+---------+-----------+----------+--------------+ PFV      Full                                                        +---------+---------------+---------+-----------+----------+--------------+ POP      Full           Yes      Yes                                  +---------+---------------+---------+-----------+----------+--------------+  PTV      Full                                                        +---------+---------------+---------+-----------+----------+--------------+ PERO     Full                                                        +---------+---------------+---------+-----------+----------+--------------+     Summary: RIGHT: - There is no evidence of deep vein thrombosis in the lower extremity.  - No cystic structure found in the popliteal fossa.  LEFT: - There is no evidence of deep vein thrombosis in the lower extremity.  - No cystic structure found in the popliteal fossa.  *See table(s) above for measurements and observations. Electronically signed by Servando Snare MD on 01/26/2020 at 5:41:30 PM.    Final    VAS [place] UPPER EXTREMITY VENOUS DUPLEX  Result Date: 01/26/2020 UPPER VENOUS STUDY  Indications: FUO Risk Factors: None identified. Limitations: Poor ultrasound/tissue interface, body habitus, bandages and patient immobility, ventilator. Comparison Study: No prior studies. Performing Technologist: Oliver Hum RVT  Examination Guidelines: A complete evaluation includes B-mode imaging, spectral Doppler, color Doppler, and power Doppler as needed of all accessible portions of each vessel. Bilateral testing is considered an integral part of a complete examination. Limited examinations for reoccurring indications may be performed as noted.  Right Findings: +----------+------------+---------+-----------+----------+-------+ RIGHT     CompressiblePhasicitySpontaneousPropertiesSummary +----------+------------+---------+-----------+----------+-------+ IJV           Full       Yes       Yes                      +----------+------------+---------+-----------+----------+-------+ Subclavian    Full       Yes       Yes                      +----------+------------+---------+-----------+----------+-------+ Axillary      Full        Yes       Yes                      +----------+------------+---------+-----------+----------+-------+ Brachial    Partial      No        No                Acute  +----------+------------+---------+-----------+----------+-------+ Radial        Full                                          +----------+------------+---------+-----------+----------+-------+ Ulnar         Full                                          +----------+------------+---------+-----------+----------+-------+ Cephalic      Full                                          +----------+------------+---------+-----------+----------+-------+  Basilic       Full                                          +----------+------------+---------+-----------+----------+-------+  Left Findings: +----------+------------+---------+-----------+----------+-------+ LEFT      CompressiblePhasicitySpontaneousPropertiesSummary +----------+------------+---------+-----------+----------+-------+ IJV           Full       Yes       Yes                      +----------+------------+---------+-----------+----------+-------+ Subclavian    Full       Yes       Yes                      +----------+------------+---------+-----------+----------+-------+ Axillary      Full       Yes       Yes                      +----------+------------+---------+-----------+----------+-------+ Brachial      Full       Yes       Yes                      +----------+------------+---------+-----------+----------+-------+ Radial        Full                                          +----------+------------+---------+-----------+----------+-------+ Ulnar         Full                                          +----------+------------+---------+-----------+----------+-------+ Cephalic      Full                                          +----------+------------+---------+-----------+----------+-------+ Basilic        Full                                          +----------+------------+---------+-----------+----------+-------+  Summary:  Right: No evidence of superficial vein thrombosis in the upper extremity. 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  Left: No evidence of deep vein thrombosis in the upper extremity. No evidence of superficial vein thrombosis in the upper extremity.  *See table(s) above for measurements and observations.  Diagnosing physician: Lemar LivingsBrandon Cain MD Electronically signed by Lemar LivingsBrandon Cain MD on 01/26/2020 at 5:42:02 PM.    Final      Anti-infectives:   Anti-infectives (From admission, onward)   Start     Dose/Rate Route Frequency Ordered Stop   01/27/20 1200  cefTRIAXone (ROCEPHIN) 2 g in sodium chloride 0.9 % 100 mL IVPB     2 g 200 mL/hr over 30 Minutes Intravenous Every 24 hours 01/27/20 1041 02/03/20 1159   01/26/20 1000  anidulafungin (ERAXIS) 100 mg in sodium chloride 0.9 % 100 mL IVPB  Status:  Discontinued  100 mg 78 mL/hr over 100 Minutes Intravenous Every 24 hours 01/25/20 0956 01/27/20 1041   01/25/20 1100  anidulafungin (ERAXIS) 200 mg in sodium chloride 0.9 % 200 mL IVPB     200 mg 78 mL/hr over 200 Minutes Intravenous  Once 01/25/20 1006 01/25/20 1525   01/18/20 1400  piperacillin-tazobactam (ZOSYN) IVPB 3.375 g  Status:  Discontinued     3.375 g 12.5 mL/hr over 240 Minutes Intravenous Every 8 hours 01/18/20 1311 01/27/20 1041   01/18/20 0715  piperacillin-tazobactam (ZOSYN) IVPB 3.375 g     3.375 g 100 mL/hr over 30 Minutes Intravenous  Once 01/18/20 0707 01/18/20 0837      Ovidio Kin, MD, [facility] Surgery Office: [phone] 01/28/2020

## 2020-01-28 NOTE — Progress Notes (Addendum)
[place] for Lovenox Indication: DVT , R brachial  Allergies  Allergen Reactions  . Ibuprofen Itching   Patient Measurements: Height: _0  (154.9 cm) Weight: 76.4 kg (168 lb 6.9 oz) IBW/kg (Calculated) : 47.8 Heparin Dosing Weight: 67kg  Vital Signs: Temp: 98.1 F (36.7 C) (05/15 0835) Temp Source: Axillary (05/15 0835) BP: 115/56 (05/15 0600) Pulse Rate: 73 (05/15 0600)  Labs: Recent Labs    01/26/20 0031 01/26/20 0544 01/26/20 2100 01/27/20 0347 01/27/20 1055 01/27/20 1837 01/28/20 0046  HGB  --   --   --  7.7*  --   --  7.5*  HCT  --   --   --  23.6*  --   --  22.8*  PLT  --   --   --  455*  --   --  452*  HEPARINUNFRC  --   --    < >  --  0.13* 0.48 0.71*  CREATININE 0.65 0.55  --  0.73  --   --   --    < > = values in this interval not displayed.   Estimated Creatinine Clearance: 70.8 mL/min (by C-G formula based on SCr of 0.73 mg/dL).  Medical History: Past Medical History:  Diagnosis Date  . Blurred vision   . Cervicalgia   . Chronic pain   . Diabetes mellitus without complication (HCC)    diet controlled borderline  . Headache   . Hypertension   . Numbness and tingling    Medications:  Scheduled:  . albuterol  2.5 mg Nebulization Q6H  . chlorhexidine gluconate (MEDLINE KIT)  15 mL Mouth Rinse BID  . Chlorhexidine Gluconate Cloth  6 each Topical Daily  . cloNIDine  0.2 mg Per Tube Q6H  . feeding supplement (PRO-STAT SUGAR FREE 64)  30 mL Per Tube BID  . insulin aspart  2-6 Units Subcutaneous Q4H  . lactulose  30 g Oral BID  . mouth rinse  15 mL Mouth Rinse 10 times per day  . nicotine  7 mg Transdermal Daily  . polyethylene glycol  17 g Per Tube Daily  . QUEtiapine  50 mg Per Tube QHS  . sodium chloride flush  10-40 mL Intracatheter Q12H   Infusions:  . sodium chloride Stopped (01/25/20 2356)  . cefTRIAXone (ROCEPHIN)  IV Stopped (01/27/20 1302)  . dexmedetomidine (PRECEDEX) IV infusion 1.2 mcg/kg/hr  (01/28/20 0702)  . feeding supplement (VITAL AF 1.2 CAL) Stopped (01/27/20 2100)  . HYDROmorphone 4 mg/hr (01/28/20 0600)  . midazolam 2 mg/hr (01/28/20 0600)  . TPN ADULT (ION) 40 mL/hr at 01/28/20 0600   Assessment:  9 yoF s/p Sigmoid colon perforation secondary to chronic constipation and ulceration with feculent peritonitis and gross contamination of abdominal cavity -S/pExploratory laparotomy with partial colectomy and end colostomy5/5 Dr. Brantley Stage 5/13: begin IV Heparin for R brachial DVT at 1000 units/hr, no bolus per CCS  01/28/2020 Change Heparin to Lovenox  Goal of Therapy:  Heparin level 0.3-0.7 units/ml Monitor platelets by anticoagulation protocol: Yes    Plan:  Discontinue Heparin infusion Begin Lovenox 66m [place] q12 (~ 167mkg q12) at one hr after Heparin discontinued Consider change Lovenox to 1.50m850mg q24 Continue daily CBC Monitor for s/s bleed  GreMinda DittoarmD 01/28/2020, 9:51 AM

## 2020-01-28 NOTE — Progress Notes (Signed)
eLink Physician-Brief Progress Note Patient Name: Darlene Castillo DOB: May 09, 1961 MRN: [id number]   Date of Service  01/28/2020  HPI/Events of Note  Nursing concerned about aspiration.   eICU Interventions  Will order: 1. Portable CXR STAT.     Intervention Category Major Interventions: Hypoxemia - evaluation and management  Rishabh Rinkenberger Eugene 01/28/2020, 8:30 PM

## 2020-01-28 NOTE — Progress Notes (Signed)
Patient tube feeding had to be paused at 2100 due to pump malfunction. No new pumps available. [facility] Nurse and [facility] aware.

## 2020-01-28 NOTE — Progress Notes (Signed)
NAME:  Darlene Castillo, MRN:  [id number], DOB:  27-May-1961, LOS: 10 ADMISSION DATE:  01/18/2020, CONSULTATION DATE:  01/18/20 REFERRING MD:  Harriette Bouillon CHIEF COMPLAINT:  VDRF, septic shock  Brief History   Peritonitis 2/2 sigmoid colon perforation s/p ex-lap 5/5 with resection and end colostomy.  History of present illness   Darlene Castillo is a 36 yobf smoker  who presented early am 5/5  with abdominal pain, constipation, one episode of vomiting and was found to have a perforated sigmoid colon and peritonitis.  She went to the OR for ex lap and return to the ICU intubated.  Past Medical History  Chronic opiate use Tobacco abuse DM HTN  Significant Hospital Events   5/5 ex lap with partial colectomy and end colostomy for perf:  neg path   01/25/2020: Extubated yesterday evening but in significant respiratory distress and fatigue this morning.  Overnight decided on DO NOT INTUBATE status but husband questioning her capacity this morning.  In conversation with the patient it appears she does not have capacity.  She is not able to comprehend my conversation about intubation.  Husband reports patient is muslim and has intense desire to live.  He does not feel that patient's decision on no intubation is consistent with her values.  He also states it is not in her best interest.   Had a temperature and per CCS has CT scan abdomen did  not find any abscess.  Panculture required. Labs probably not accurate - drawn from near TPN  - likely per pharmacist. Needs repeat lab draw  5/13  Back on vent. fever curve better. PCT down to 3.4. Mag 1.8. Concern for A Fib but EKG sinus tach with PAC. Down to 40% fio2 . TF on hold but getting meds via NG. On TPN., On dilaudid gtt, scheduled methadone, versed gtt, clonidine, seroquel and nicotine patch. Started on IV heparin for RUE DVT   5/14 dc versed gtt. Working on weaning sedation. Changing abx to rocephin Consults:  CCS PCCM  Procedures:  5/5 CVL RIJ 5/5 A-line  5/7  5/5 ETT>> 5/11, replaced 5/12 5/6 PICC L brachial   Significant Diagnostic Tests:  CT abdomen pelvis with contrast 01/17/2020-scattered groundglass opacities in the lower lungs bilaterally, no focal opacities.  Abdominal free air.  Multiple dilated loops of bowel with stool.  Large R renal cyst. Path 5/5 >>> - Perforation with acute inflammation and acute serositis. No evidence of malignancy.   [place] UE 5/13>> RUE DVT  Micro Data:  Covid negative FLu PCR - neg   MRSA PCR 5/12 - neg Tracheal aspirate 01/25/20 - klebsiella > R ampicillin, Unasyn, I: zosyn Urine analysis 01/25/2020  - no growth Blood culture 01/25/2020>>>   Antimicrobials:  Zosyn 5/5  >>5/14 Eraxis 5/12 >>5/14 Rocephin 5/14>>>  Interim history/subjective:  Methadone stopped yesterday per family's request  Objective   Blood pressure (!) 115/56, pulse 73, temperature 98.1 F (36.7 C), temperature source Axillary, resp. rate (!) 30, height 5\' 1"  (1.549 m), weight 76.4 kg, last menstrual period 05/23/2013, SpO2 99 %.    Vent Mode: PRVC FiO2 (%):  [40 %] 40 % Set Rate:  [26 bmp] 26 bmp Vt Set:  [330 mL] 330 mL PEEP:  [5 cmH20] 5 cmH20 Plateau Pressure:  [21 cmH20-26 cmH20] 25 cmH20   Intake/Output Summary (Last 24 hours) at 01/28/2020 0849 Last data filed at 01/28/2020 0600 Gross per 24 hour  Intake 3117.97 ml  Output 1825 ml  Net 1292.97 ml  Filed Weights   01/26/20 0932 01/27/20 0424 01/28/20 0400  Weight: 71.9 kg 76.4 kg 76.4 kg   General: middle aged woman laying in bed intubated, sedated HEENT: [place]/AT, ETT and NGT in place Pulmonary: Tachypneic, Breathing above the vent.  Faint rhales Cardiac: Regular rate and rhythm, no murmurs Abdomen: Distended, hypoactive bowel sounds.  Wound VAC in place.  Tender to palpation diffusely. Extremities: Mild lower extremity edema, no clubbing or cyanosis.  Hands with good perfusion when mitts removed. Neuro: RASS -5, wincing during abdominal exam. Derm: No rashes no  significant erythema around wound VAC.   Resolved problems   Circulatory/septic shock   Assessment & Plan:  LOS 10 days   Acute hypoxic respiratory failure requiring mechanical ventilation, complicated by Klebsiella PNA.  Plan -Continue full vent support, 48 cc/kg ideal body with goal plateau less than 30 driving pressure less than 15.  Meeting goals.  Titrate down PEEP and FiO2 per ARDS protocol. -Daily SAT and SBT as appropriate.  Barriers to extubation remain adequate pain and sedation control, abdominal distention, and overall weakness. -Complete 7 days of ceftriaxone -PAD protocol for pain control.  Discussed sedation with her husband and daughter.  We will continue to hold methadone today.  They may be amenable to restarting it tomorrow depending on her level of sedation and pain control.   -VAP prevention protocol -Discussed the possibility of tracheostomy with her husband and daughter at bedside.  We discussed that this is not intended to be a permanent requirement, but allows prolonged vent weaning, would facilitate her needing less sedation, which could allow additional rehabilitation.  Her husband wants to discuss this with her when she is more awake.  Acute encephalopathy started 5/12, continues 5/14 Chronic pain, on oral methadone PTA 01/26/20 - improved orientation but on scheduled , On dilaudid gtt, scheduled methadone, versed gtt, clonidine, seroquel and nicotine patch -PAD protocol, get goal RASS 0 to -1 -Continue Precedex, Dilaudid.  Attempting to wean Versed.  Holding methadone at the family's request.  I explained that she will not be able to go without all opiates due to acute pain and physiologic changes associated with chronic pain and chronic opiate use that continue to contribute. -Continue clonidine  Peritonitis 2/2 sigmoid colon perforation from chronic constipation - s/p ex lap with sigmoid resection and colostomy. -Continue wound care and ostomy care -Continue  to monitor for signs of infection. Off antibiotics for initial peritonitis after completing 10 days.  DVT right brachial vein -Transition from heparin drip to Lovenox given no signs of bleeding; needs 3 months of anticoagulation for provoked DVT  Malnutrition- Expected prolonged ileus post op. Plan -Continue tube feeds and advance as tolerated. -Continue TPN until maintaining full nutritional needs enterally.  Intermittent Fluid and Electrolyte imbalance-net positive volume status over the last few days -Lasix x1 dose today -Continue to monitor  Hyperglycemia-uncontrolled -Start tube feeding coverage every 4 hours with hold parameters -Continue sliding scale insulin every 4 hours -Goal BG 140-180 while admitted to the ICU  I had a lengthy meeting at bedside with Darlene Castillo' husband [name] and her daughter regarding her concerns with her level of pain control and oversedation, eventual possible need for tracheostomy, and overall care.  All questions were answered.  Best practice:  Diet: NPO.  TPN & TF Pain/Anxiety/Delirium protocol (if indicated): see above VAP protocol (if indicated): In place. DVT prophylaxis: SCDs and lovenox.  GI prophylaxis: pepcid. Glucose control: SSI. Mobility: bedrest. Code Status: full. Family Communication: Husband and daughter  updated at bedside Disposition: ICU   BMP Latest Ref Rng & Units 01/27/2020 01/26/2020 01/26/2020  Glucose 70 - 99 mg/dL 217(H) 183(H) 180(H)  BUN 6 - 20 mg/dL 27(H) 17 19  Creatinine 0.44 - 1.00 mg/dL 0.73 0.55 0.65  Sodium 135 - 145 mmol/L 137 139 139  Potassium 3.5 - 5.1 mmol/L 4.6 4.1 3.9  Chloride 98 - 111 mmol/L 103 101 105  CO2 22 - 32 mmol/L 26 27 27   Calcium 8.9 - 10.3 mg/dL 8.1(L) 8.1(L) 7.8(L)    CBC    Component Value Date/Time   WBC 15.4 (H) 01/28/2020 0046   RBC 2.76 (L) 01/28/2020 0046   HGB 7.5 (L) 01/28/2020 0046   HCT 22.8 (L) 01/28/2020 0046   PLT 452 (H) 01/28/2020 0046   MCV 82.6 01/28/2020 0046    MCH 27.2 01/28/2020 0046   MCHC 32.9 01/28/2020 0046   RDW 14.0 01/28/2020 0046   LYMPHSABS 0.9 01/23/2020 0345   MONOABS 0.9 01/23/2020 0345   EOSABS 0.1 01/23/2020 0345   BASOSABS 0.0 01/23/2020 0345     This patient is critically ill with multiple organ system failure which requires frequent high complexity decision making, assessment, support, evaluation, and titration of therapies. This was completed through the application of advanced monitoring technologies and extensive interpretation of multiple databases. During this encounter critical care time was devoted to patient care services described in this note for 65 minutes.    Julian Hy, DO 01/28/20 10:03 AM [facility]

## 2020-01-28 NOTE — Progress Notes (Signed)
Pharmacy: [facility]  Patient's a 59 y.o F currently on heparin for R brachial DVT.  - Heparin level is slightly supra-therapeutic at 0.71 with current rate of 1700 units/hr.  - hgb down 7.5, plts stable -  Per pt's RN, no issues with IV line and no bleeding noted.  Goal of Therapy:  Heparin level 0.3-0.7 units/ml Monitor platelets by anticoagulation protocol: Yes  Plan: - Decrease heparin drip to 1600 units/hr - check 6 hr heparin level - monitor for s/sx  Dorna Leitz, PharmD, BCPS 01/28/2020 1:27 AM

## 2020-01-29 ENCOUNTER — Inpatient Hospital Stay (HOSPITAL_COMMUNITY): Payer: BLUE CROSS/BLUE SHIELD

## 2020-01-29 DIAGNOSIS — K5903 Drug induced constipation: Secondary | ICD-10-CM

## 2020-01-29 DIAGNOSIS — F1111 Opioid abuse, in remission: Secondary | ICD-10-CM

## 2020-01-29 LAB — BASIC METABOLIC PANEL
Anion gap: 8 (ref 5–15)
BUN: 43 mg/dL — ABNORMAL HIGH (ref 6–20)
CO2: 25 mmol/L (ref 22–32)
Calcium: 8.6 mg/dL — ABNORMAL LOW (ref 8.9–10.3)
Chloride: 110 mmol/L (ref 98–111)
Creatinine, Ser: 0.81 mg/dL (ref 0.44–1.00)
GFR calc Af Amer: >60 mL/min (ref >60)
GFR calc non Af Amer: >60 mL/min (ref >60)
Glucose, Bld: 101 mg/dL — ABNORMAL HIGH (ref 70–99)
Potassium: 5.2 mmol/L — ABNORMAL HIGH (ref 3.5–5.1)
Sodium: 143 mmol/L (ref 135–145)

## 2020-01-29 LAB — GLUCOSE, CAPILLARY
Glucose-Capillary: 110 mg/dL — ABNORMAL HIGH (ref 70–99)
Glucose-Capillary: 116 mg/dL — ABNORMAL HIGH (ref 70–99)
Glucose-Capillary: 139 mg/dL — ABNORMAL HIGH (ref 70–99)
Glucose-Capillary: 164 mg/dL — ABNORMAL HIGH (ref 70–99)
Glucose-Capillary: 192 mg/dL — ABNORMAL HIGH (ref 70–99)
Glucose-Capillary: 92 mg/dL (ref 70–99)

## 2020-01-29 LAB — CBC
HCT: 23.6 % — ABNORMAL LOW (ref 36.0–46.0)
Hemoglobin: 7.4 g/dL — ABNORMAL LOW (ref 12.0–15.0)
MCH: 27 pg (ref 26.0–34.0)
MCHC: 31.4 g/dL (ref 30.0–36.0)
MCV: 86.1 fL (ref 80.0–100.0)
Platelets: 564 10*3/uL — ABNORMAL HIGH (ref 150–400)
RBC: 2.74 MIL/uL — ABNORMAL LOW (ref 3.87–5.11)
RDW: 14.7 % (ref 11.5–15.5)
WBC: 14.5 10*3/uL — ABNORMAL HIGH (ref 4.0–10.5)
nRBC: 0.3 % — ABNORMAL HIGH (ref 0.0–0.2)

## 2020-01-29 LAB — MAGNESIUM: Magnesium: 2.6 mg/dL — ABNORMAL HIGH (ref 1.7–2.4)

## 2020-01-29 LAB — PHOSPHORUS: Phosphorus: 5.4 mg/dL — ABNORMAL HIGH (ref 2.5–4.6)

## 2020-01-29 MED ORDER — FUROSEMIDE 10 MG/ML IJ SOLN
40.0000 mg | Freq: Four times a day (QID) | INTRAMUSCULAR | Status: AC
  Administered 2020-01-29 (×2): 40 mg via INTRAVENOUS
  Filled 2020-01-29 (×2): qty 4

## 2020-01-29 MED ORDER — DEXMEDETOMIDINE HCL IN NACL 400 MCG/100ML IV SOLN
0.4000 ug/kg/h | INTRAVENOUS | Status: DC
  Administered 2020-01-29 (×2): 1.2 ug/kg/h via INTRAVENOUS
  Administered 2020-01-29: 1 ug/kg/h via INTRAVENOUS
  Administered 2020-01-30 (×4): 1.2 ug/kg/h via INTRAVENOUS
  Administered 2020-01-31 (×3): 0.8 ug/kg/h via INTRAVENOUS
  Administered 2020-02-01: 0.6 ug/kg/h via INTRAVENOUS
  Administered 2020-02-01: 0.5 ug/kg/h via INTRAVENOUS
  Administered 2020-02-02: 1 ug/kg/h via INTRAVENOUS
  Administered 2020-02-02: 1.2 ug/kg/h via INTRAVENOUS
  Administered 2020-02-02: 0.7 ug/kg/h via INTRAVENOUS
  Administered 2020-02-03 – 2020-02-04 (×7): 1 ug/kg/h via INTRAVENOUS
  Administered 2020-02-04: 1.1 ug/kg/h via INTRAVENOUS
  Administered 2020-02-04 – 2020-02-05 (×2): 1 ug/kg/h via INTRAVENOUS
  Administered 2020-02-05 (×2): 1.1 ug/kg/h via INTRAVENOUS
  Administered 2020-02-06: 0.8 ug/kg/h via INTRAVENOUS
  Administered 2020-02-06: 1 ug/kg/h via INTRAVENOUS
  Administered 2020-02-06: 0.8 ug/kg/h via INTRAVENOUS
  Administered 2020-02-07: 0.7 ug/kg/h via INTRAVENOUS
  Administered 2020-02-07: 0.8 ug/kg/h via INTRAVENOUS
  Administered 2020-02-08: 0.5 ug/kg/h via INTRAVENOUS
  Filled 2020-01-29 (×18): qty 100
  Filled 2020-01-29: qty 200
  Filled 2020-01-29 (×21): qty 100

## 2020-01-29 MED ORDER — TRAVASOL 10 % IV SOLN
INTRAVENOUS | Status: AC
  Filled 2020-01-29: qty 432

## 2020-01-29 MED ORDER — METHADONE HCL 10 MG/ML IJ SOLN
5.0000 mg | Freq: Four times a day (QID) | INTRAMUSCULAR | Status: DC
  Administered 2020-01-29 – 2020-02-07 (×35): 5 mg via INTRAVENOUS
  Filled 2020-01-29 (×38): qty 0.5

## 2020-01-29 NOTE — Progress Notes (Signed)
[facility]:  [id number] General Surgery Progress Note   LOS: 11 days  POD -  11 Days Post-Op  Assessment and Plan: 1.  EXPLORATORY LAPAROTOMY SIGMOID RESECTION AND COLOSTOMY - 01/18/2020 - [facility]  For sigmoid perforation  Rocephin  WBC - 14,500 - 01/29/2020  Colostomy output has increase a lot over the last day   2.  VDRF, ?VAP- extubated 5/11, reintubated 5/12. CCM discussing trach 3.  HTN 4.  Prediabetic- A1c6.3 5.  Tobacco abuse 6.  On methadone (this is actually being held at this time per family request)  Prior h/o heroin abuse at least 15 years ago 7.  Chronic constipation  On lactulose/miralax for this - this may be weaned as her colostomy output improves 8.  Malnutrition - prealbumin<5 (5/10),   continueTPN - she is tolerating TF -  9.  DVT prophylaxis - Lovenox 10.  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  Apparently requiring more sedation since she is off the methadone.    Objective:   Vitals:   01/29/20 0630 01/29/20 0757  BP: 127/61   Pulse: 73   Resp: (!) 26   Temp:    SpO2: 98% 98%     Intake/Output from previous day:  05/15 0701 - 05/16 0700 In: 2265.3 [I.V.:1607.5; NG/GT:559.7; IV Piggyback:98.1] Out: 1650 [Urine:1600; Drains:50]  Intake/Output this shift:  No intake/output data recorded.   Physical Exam:   General: AA F who is intubated.  She is  sedated.   Lungs: Rhonchi   Abdomen: Distended.  Few BS.   Wound: Midline wound with VAC.  Ostomy left abdomen with stool.   Lab Results:    Recent Labs    01/28/20 0046 01/29/20 0252  WBC 15.4* 14.5*  HGB 7.5* 7.4*  HCT 22.8* 23.6*  PLT 452* 564*    BMET   Recent Labs    01/27/20 0347 01/29/20 0252  NA 137 143  K 4.6 5.2*  CL 103 110  CO2 26 25  GLUCOSE 217* 101*  BUN 27* 43*  CREATININE 0.73 0.81  CALCIUM 8.1* 8.6*    PT/INR  No results for input(s): LABPROT, INR in the last 72 hours.  ABG   No results for input(s): PHART, HCO3 in the last 72 hours.  Invalid input(s): PCO2, PO2   Studies/Results:  DG Chest 1 View  Result Date: 01/27/2020 CLINICAL DATA:  Decreased oxygen saturation EXAM: CHEST  1 VIEW COMPARISON:  01/27/2020 FINDINGS: Endotracheal tube in good position. Left arm PICC tip in the right atrium. NG tube coiled in the stomach Diffuse bilateral airspace disease unchanged. No significant pleural fluid. IMPRESSION: Endotracheal tube in good position.  PICC tip in the right atrium. Diffuse bilateral airspace disease unchanged. Electronically Signed   By: Marlan Palau M.D.   On: 01/27/2020 14:54   DG Abd 1 View  Result Date: 01/27/2020 CLINICAL DATA:  New enteric tube placement. EXAM: ABDOMEN - 1 VIEW COMPARISON:  Abdominal x-ray  dated Jan 25, 2020. FINDINGS: Enteric tube in the stomach. The bowel gas pattern is normal. Stool in the colon. No radio-opaque calculi or other significant radiographic abnormality are seen. IMPRESSION: Enteric tube in the stomach. Electronically Signed   By: Titus Dubin M.D.   On: 01/27/2020 08:52   DG CHEST PORT 1 VIEW  Result Date: 01/28/2020 CLINICAL DATA:  Aspiration. EXAM: PORTABLE CHEST 1 VIEW COMPARISON:  Radiograph yesterday. FINDINGS: Endotracheal tube tip 3.4 cm from the carina. Enteric tube tip below the diaphragm in the stomach. Left upper extremity PICC tip in the right atrium. Low lung volumes. Worsening  retrocardiac opacity. Additional streaky perihilar opacities are not significantly changed. No pneumothorax. No large pleural effusion. IMPRESSION: 1. Worsening retrocardiac opacity since yesterday, may be atelectasis, aspiration or pneumonia. 2. Additional streaky perihilar opacities are unchanged. 3. Stable support apparatus. Electronically Signed   By: Keith Rake M.D.   On: 01/28/2020 21:30     Anti-infectives:   Anti-infectives (From admission, onward)   Start     Dose/Rate Route Frequency Ordered Stop   01/27/20 1200  cefTRIAXone (ROCEPHIN) 2 g in sodium chloride 0.9 % 100 mL IVPB     2 g 200 mL/hr over 30 Minutes Intravenous Every 24 hours 01/27/20 1041 02/03/20 1159   01/26/20 1000  anidulafungin (ERAXIS) 100 mg in sodium chloride 0.9 % 100 mL IVPB  Status:  Discontinued     100 mg 78 mL/hr over 100 Minutes Intravenous Every 24 hours 01/25/20 0956 01/27/20 1041   01/25/20 1100  anidulafungin (ERAXIS) 200 mg in sodium chloride 0.9 % 200 mL IVPB     200 mg 78 mL/hr over 200 Minutes Intravenous  Once 01/25/20 1006 01/25/20 1525   01/18/20 1400  piperacillin-tazobactam (ZOSYN) IVPB 3.375 g  Status:  Discontinued     3.375 g 12.5 mL/hr over 240 Minutes Intravenous Every 8 hours 01/18/20 1311 01/27/20 1041   01/18/20 0715  piperacillin-tazobactam (ZOSYN) IVPB 3.375 g     3.375 g 100 mL/hr over 30 Minutes Intravenous  Once 01/18/20 0707 01/18/20 Nitro, MD, [facility]: [phone] 01/29/2020

## 2020-01-29 NOTE — Plan of Care (Signed)
Updated Ms. Howells's mother Stark Klein over the phone. She said the family is okay with restarting methadone to facilitate sedation and pain control. She said they are likely ok with trach this week. She has been discussing this with [name], the husband.  Steffanie Dunn, DO 01/29/20 5:22 PM  [facility]

## 2020-01-29 NOTE — Progress Notes (Signed)
NAME:  Darlene Castillo, MRN:  [id number], DOB:  July 02, 1961, LOS: 11 ADMISSION DATE:  01/18/2020, CONSULTATION DATE:  01/18/20 REFERRING MD:  Harriette Bouillon CHIEF COMPLAINT:  VDRF, septic shock  Brief History   Peritonitis 2/2 sigmoid colon perforation s/p ex-lap 5/5 with resection and end colostomy.  History of present illness   Darlene Castillo is a 44 yobf smoker  who presented early am 5/5  with abdominal pain, constipation, one episode of vomiting and was found to have a perforated sigmoid colon and peritonitis.  She went to the OR for ex lap and return to the ICU intubated.  Past Medical History  Chronic opiate use Tobacco abuse DM HTN  Significant Hospital Events   5/5 ex lap with partial colectomy and end colostomy for perf:  neg path   01/25/2020: Extubated yesterday evening but in significant respiratory distress and fatigue this morning.  Overnight decided on DO NOT INTUBATE status but husband questioning her capacity this morning.  In conversation with the patient it appears she does not have capacity.  She is not able to comprehend my conversation about intubation.  Husband reports patient is muslim and has intense desire to live.  He does not feel that patient's decision on no intubation is consistent with her values.  He also states it is not in her best interest.   Had a temperature and per CCS has CT scan abdomen did  not find any abscess.  Panculture required. Labs probably not accurate - drawn from near TPN  - likely per pharmacist. Needs repeat lab draw  5/13  Back on vent. fever curve better. PCT down to 3.4. Mag 1.8. Concern for A Fib but EKG sinus tach with PAC. Down to 40% fio2 . TF on hold but getting meds via NG. On TPN., On dilaudid gtt, scheduled methadone, versed gtt, clonidine, seroquel and nicotine patch. Started on IV heparin for RUE DVT   5/14 dc versed gtt. Working on weaning sedation. Changing abx to rocephin Consults:  CCS PCCM  Procedures:  5/5 CVL RIJ 5/5 A-line  5/7  5/5 ETT>> 5/11, replaced 5/12 5/6 PICC L brachial   Significant Diagnostic Tests:  CT abdomen pelvis with contrast 01/17/2020-scattered groundglass opacities in the lower lungs bilaterally, no focal opacities.  Abdominal free air.  Multiple dilated loops of bowel with stool.  Large R renal cyst. Path 5/5 >>> - Perforation with acute inflammation and acute serositis. No evidence of malignancy.   [place] UE 5/13>> RUE DVT  Micro Data:  Covid negative FLu PCR - neg   MRSA PCR 5/12 - neg Tracheal aspirate 01/25/20 - klebsiella > R ampicillin, Unasyn, I: zosyn Urine analysis 01/25/2020  - no growth Blood culture 01/25/2020>>>   Antimicrobials:  Zosyn 5/5  >>5/14 Eraxis 5/12 >>5/14 Rocephin 5/14>>>  Interim history/subjective:  Methadone held yesterday per family's request. Required increased sedation throughout the day and night. Yesterday husband [name] and daughter were here when we were attempting to wake her up while she was very agitated and they were unable to redirect her to avoid increasing sedation. Overnight there was concern for aspiration, but this was due to the frothy sputum that she has continued to produce with her ETT.  Objective   Blood pressure (!) 154/76, pulse (!) 120, temperature 97.8 F (36.6 C), temperature source Axillary, resp. rate (!) 34, height 5\' 1"  (1.549 m), weight 76.4 kg, last menstrual period 05/23/2013, SpO2 100 %.    Vent Mode: PRVC FiO2 (%):  [30 %-40 %]  40 % Set Rate:  [26 bmp] 26 bmp Vt Set:  [330 mL] 330 mL PEEP:  [5 cmH20] 5 cmH20 Plateau Pressure:  [23 cmH20-27 cmH20] 27 cmH20   Intake/Output Summary (Last 24 hours) at 01/29/2020 1546 Last data filed at 01/29/2020 1200 Gross per 24 hour  Intake 2130.83 ml  Output 1650 ml  Net 480.83 ml   Filed Weights   01/26/20 0932 01/27/20 0424 01/28/20 0400  Weight: 71.9 kg 76.4 kg 76.4 kg   General:  Middle-aged woman laying in bed intubated, agitated despite sedation HEENT: [place]/AT, eyes  anicteric Pulmonary: tachypneic breathing above the vent. Thick mostly clear secretions from ETT. Rhonchi cleared with suctioning. Cardiac: tachycardic, regular rhtyhm Abdomen: distended, TTP. Ostomy pink with stool output. Abd incision with wound vac. Extremities: mild LE edema, no c/c Neuro: agitated, moving all extremities, nodding to answer some quesitons Derm: no rashes or ecchymoses   Resolved problems   Circulatory/septic shock   Assessment & Plan:  LOS 11 days   Acute hypoxic respiratory failure requiring mechanical ventilation, complicated by Klebsiella PNA.  Plan -Con't full vent support, 4-8cc/kg IBW, goal P plat <30 & driving pressure <62. Peak pressures low, but unable to assess plateau due to vent dyssynchrony. -Intolreant to SAT. Severe tachypnea precludes safe SBT. -Complete 7 days of ceftriaxone -PAD protocol. Attempted to call mother Legrand Rams to discuss potentially restarting methadone. -VAP prevention protocol -Have previously discussed the recommendation for tracheostomy to facilitate vent weaning. Severe agitation and WOB in addition to ongoing significant distention is worrisome that her breathing mechanics would limit her again if extubation was attempted soon. Family is considering this.  Acute encephalopathy started 5/12, continues 5/14 Chronic pain, on oral methadone PTA 01/26/20 - improved orientation but on scheduled , On dilaudid gtt, scheduled methadone, versed gtt, clonidine, seroquel and nicotine patch -PAD protocol, get goal RASS 0 to -1 -Con't versed, precedex, dilaudid.  Attempting to wean Versed.  Holding methadone at the family's request. I have previously attempted to explain that she will not be able to go without all opiates currently given acute pain and chronic opiate use. -Continue clonidine  Peritonitis 2/2 sigmoid colon perforation from chronic constipation - s/p ex lap with sigmoid resection and colostomy. -Continue wound care and ostomy  care -Continue to monitor for signs of infection. Off antibiotics for initial peritonitis after completing 10 days.   DVT right brachial vein -Con't lovenox BID  Malnutrition- Expected prolonged ileus post- op, which is improving. Plan -Continue tube feeds and advance as tolerated. -Continue TPN until maintaining full nutritional needs enterally. Hopefully will meet goal tomorrow.  Intermittent Fluid and Electrolyte imbalance-net positive volume status over the last few days -Lasix x2 doses today as she remains net positive. -Continue to monitor  Hyperglycemia-controlled -Con't tube feeding coverage every 4 hours with hold parameters -Continue sliding scale insulin every 4 hours -Goal BG 140-180 while admitted to the ICU  Yesterday I had a lengthy meeting at bedside with Mrs. Haggar' husband Lysle Rubens and her daughter regarding her concerns with her level of pain control and oversedation, eventual possible need for tracheostomy, and overall care.  All questions were answered. Today I have attempted to contact Legrand Rams, the patient's mother who is a retired Therapist, sports. Unable to reach her by phone.  Best practice:  Diet: NPO; TPN & TF Pain/Anxiety/Delirium protocol (if indicated): see above VAP protocol (if indicated): In place DVT prophylaxis: SCDs and lovenox GI prophylaxis: pepcid Glucose control: SSI Mobility: bedrest Code Status: full Family Communication: Husband  and daughter updated at bedside 5/15 Disposition: ICU   BMP Latest Ref Rng & Units 01/29/2020 01/27/2020 01/26/2020  Glucose 70 - 99 mg/dL 761(P) 509(T) 267(T)  BUN 6 - 20 mg/dL 24(P) 80(D) 17  Creatinine 0.44 - 1.00 mg/dL 9.83 3.82 5.05  Sodium 135 - 145 mmol/L 143 137 139  Potassium 3.5 - 5.1 mmol/L 5.2(H) 4.6 4.1  Chloride 98 - 111 mmol/L 110 103 101  CO2 22 - 32 mmol/L 25 26 27   Calcium 8.9 - 10.3 mg/dL ) 8.1(L) 8.1(L)    CBC    Component Value Date/Time   WBC 14.5 (H) 01/29/2020 0252   RBC 2.74 (L) 01/29/2020  0252   HGB 7.4 (L) 01/29/2020 0252   HCT 23.6 (L) 01/29/2020 0252   PLT 564 (H) 01/29/2020 0252   MCV 86.1 01/29/2020 0252   MCH 27.0 01/29/2020 0252   MCHC 31.4 01/29/2020 0252   RDW 14.7 01/29/2020 0252   LYMPHSABS 0.9 01/23/2020 0345   MONOABS 0.9 01/23/2020 0345   EOSABS 0.1 01/23/2020 0345   BASOSABS 0.0 01/23/2020 0345     This patient is critically ill with multiple organ system failure which requires frequent high complexity decision making, assessment, support, evaluation, and titration of therapies. This was completed through the application of advanced monitoring technologies and extensive interpretation of multiple databases. During this encounter critical care time was devoted to patient care services described in this note for 40 minutes.    03/24/2020, DO 01/29/20 3:46 PM [facility]

## 2020-01-29 NOTE — Progress Notes (Signed)
PHARMACY - TOTAL PARENTERAL NUTRITION CONSULT NOTE   Indication: Prolonged ileus  Patient Measurements: Height: 5\' 1"  (154.9 cm) Weight: 76.4 kg (168 lb 6.9 oz) IBW/kg (Calculated) : 47.8   Body mass index is 31.82 kg/m. Usual Weight: 67 kg  Assessment: 59 yo F s/p Sigmoid colon perforation secondary to chronic constipation and ulceration with feculent peritonitis and gross contamination of abdominal cavity with stool balls  -S/p Exploratory laparotomy with partial colectomy and end colostomy 5/5 Dr. 46 5/6 PICC placed and started TPN for nutrition support; expect prolonged ileus  Glucose / Insulin: prediabetes, A1c 6.3, CBGs higher with tube feed started 5/13, range 92-164, MD added 4 units insulin q4hr, received additional 12 units per SSI - total 36 units/24hr. Did not add insulin to 5/14 & 5/15 TPN bags in anticipation of stopping TPN with tube feed ramp up.  Electrolytes: last 5/14 Phos 5.4, Mag 2.6 (goal >=2) , K 5.2 (goal >=4); CoCa 10.2 Renal: Scr WNL, 1.7 L charted UOP over past 24hr LFTs / TGs: WNL; Trig 27> 331> 434 > 270 > 210, propofol dc'd 5/7, Lipids now in formula SMOF lipid emulsion added 5/12, had been held for first 7 days for critically ill patients per ASPEN guidelines Prealbumin / albumin: prealbumin 9.7 5/6 > now < 5 reflects inflammation of post-op stress and critical illness, admit alb 4.5 WNL, BMI 27.9, well-nourished PTA, poor PO intake for 5 days PTA Intake / Output; MIVF: IVF dc'd Surgeries / Procedures:   -S/p Exploratory laparotomy with partial colectomy and end colostomy 5/5 Dr. 7/12 GI Imaging: CT 5/12 -  diffuse inflammatory wall thickening throughout the colon with diffuse fecal material identified, free fluid in the pelvis but no definitive abscess.  Central access: PICC 5/6 for TPN TPN start date: 01/19/2020  Nutritional Goals (per RD recommendation updated on 01/23/2020): kCal: 1379, Protein: 114-130, Fluid: >= 1.3 L/day  Goal TPN rate is  80 mL/hr (provides 115 g of protein and ~1400 kcals per day) with lipids  Current Nutrition:  TPN at 40 ml/hr Tube feed off since 5/14 at 9p - pump malfunction, resumed 5/15 am 5/16 Tube feed was charted as 40 ml/hr, but rate not changed on pump  Goal rate Tube feed 55 ml/hr + Pro-Stat delivers 129 gm protein, 1784 kCal  Plan:  Reduced TPN rate to 30 ml/hr  Anticipate TF at goal in next 24 hr and d/c TPN 5/17 am Electrolytes: 71mEq/L of Na, 43mEq/L of K, 2.91mEq/L of Ca, 2.50mEq/L of Mg, 0 mmol/L of Phos . Cl:Ac ratio 1:1 Standard MVI and trace elements daily Continue q4h SSI,    Monitor TPN labs on Mon/Thurs  4m PharmD [phone] 01/29/2020, 12:44 PM  Dedicated ICU phone [phone] weekdays only till 2:30pm

## 2020-01-30 ENCOUNTER — Inpatient Hospital Stay (HOSPITAL_COMMUNITY): Payer: BLUE CROSS/BLUE SHIELD

## 2020-01-30 LAB — CULTURE, BLOOD (ROUTINE X 2)
Culture: NO GROWTH
Culture: NO GROWTH
Special Requests: ADEQUATE
Special Requests: ADEQUATE

## 2020-01-30 LAB — CBC
HCT: 23.6 % — ABNORMAL LOW (ref 36.0–46.0)
Hemoglobin: 7.4 g/dL — ABNORMAL LOW (ref 12.0–15.0)
MCH: 26.6 pg (ref 26.0–34.0)
MCHC: 31.4 g/dL (ref 30.0–36.0)
MCV: 84.9 fL (ref 80.0–100.0)
Platelets: 522 10*3/uL — ABNORMAL HIGH (ref 150–400)
RBC: 2.78 MIL/uL — ABNORMAL LOW (ref 3.87–5.11)
RDW: 14.6 % (ref 11.5–15.5)
WBC: 12 10*3/uL — ABNORMAL HIGH (ref 4.0–10.5)
nRBC: 0.6 % — ABNORMAL HIGH (ref 0.0–0.2)

## 2020-01-30 LAB — PHOSPHORUS: Phosphorus: 5.4 mg/dL — ABNORMAL HIGH (ref 2.5–4.6)

## 2020-01-30 LAB — GLUCOSE, CAPILLARY
Glucose-Capillary: 84 mg/dL (ref 70–99)
Glucose-Capillary: 88 mg/dL (ref 70–99)
Glucose-Capillary: 100 mg/dL — ABNORMAL HIGH (ref 70–99)
Glucose-Capillary: 122 mg/dL — ABNORMAL HIGH (ref 70–99)
Glucose-Capillary: 114 mg/dL — ABNORMAL HIGH (ref 70–99)
Glucose-Capillary: 165 mg/dL — ABNORMAL HIGH (ref 70–99)

## 2020-01-30 LAB — DIFFERENTIAL
Abs Immature Granulocytes: 0.26 10*3/uL — ABNORMAL HIGH (ref 0.00–0.07)
Basophils Absolute: 0.1 10*3/uL (ref 0.0–0.1)
Basophils Relative: 0 %
Eosinophils Absolute: 0.2 10*3/uL (ref 0.0–0.5)
Eosinophils Relative: 2 %
Immature Granulocytes: 2 %
Lymphocytes Relative: 13 %
Lymphs Abs: 1.6 10*3/uL (ref 0.7–4.0)
Monocytes Absolute: 1 10*3/uL (ref 0.1–1.0)
Monocytes Relative: 8 %
Neutro Abs: 8.9 10*3/uL — ABNORMAL HIGH (ref 1.7–7.7)
Neutrophils Relative %: 75 %

## 2020-01-30 LAB — COMPREHENSIVE METABOLIC PANEL
ALT: 50 U/L — ABNORMAL HIGH (ref 0–44)
AST: 66 U/L — ABNORMAL HIGH (ref 15–41)
Albumin: 1.9 g/dL — ABNORMAL LOW (ref 3.5–5.0)
Alkaline Phosphatase: 271 U/L — ABNORMAL HIGH (ref 38–126)
Anion gap: 12 (ref 5–15)
BUN: 36 mg/dL — ABNORMAL HIGH (ref 6–20)
CO2: 28 mmol/L (ref 22–32)
Calcium: 9 mg/dL (ref 8.9–10.3)
Chloride: 103 mmol/L (ref 98–111)
Creatinine, Ser: 0.81 mg/dL (ref 0.44–1.00)
GFR calc Af Amer: >60 mL/min (ref >60)
GFR calc non Af Amer: >60 mL/min (ref >60)
Glucose, Bld: 103 mg/dL — ABNORMAL HIGH (ref 70–99)
Potassium: 4.1 mmol/L (ref 3.5–5.1)
Sodium: 143 mmol/L (ref 135–145)
Total Bilirubin: 0.2 mg/dL — ABNORMAL LOW (ref 0.3–1.2)
Total Protein: 7 g/dL (ref 6.5–8.1)

## 2020-01-30 LAB — MAGNESIUM: Magnesium: 2.1 mg/dL (ref 1.7–2.4)

## 2020-01-30 LAB — PREALBUMIN: Prealbumin: 7.9 mg/dL — ABNORMAL LOW (ref 18–38)

## 2020-01-30 LAB — TRIGLYCERIDES: Triglycerides: 205 mg/dL — ABNORMAL HIGH (ref <150)

## 2020-01-30 MED ORDER — TRAVASOL 10 % IV SOLN
INTRAVENOUS | Status: AC
  Filled 2020-01-30: qty 576

## 2020-01-30 MED ORDER — SODIUM CHLORIDE (PF) 0.9 % IJ SOLN
INTRAMUSCULAR | Status: AC
  Filled 2020-01-30: qty 50

## 2020-01-30 MED ORDER — IOHEXOL 9 MG/ML PO SOLN
ORAL | Status: AC
  Filled 2020-01-30: qty 1000

## 2020-01-30 MED ORDER — POLYETHYLENE GLYCOL 3350 17 G PO PACK
17.0000 g | PACK | Freq: Two times a day (BID) | ORAL | Status: DC
  Administered 2020-01-30 – 2020-02-06 (×8): 17 g
  Filled 2020-01-30 (×7): qty 1

## 2020-01-30 MED ORDER — LACTULOSE 10 GM/15ML PO SOLN
30.0000 g | Freq: Two times a day (BID) | ORAL | Status: DC
  Administered 2020-01-30 – 2020-02-07 (×13): 30 g
  Filled 2020-01-30 (×15): qty 45

## 2020-01-30 MED ORDER — SORBITOL 70 % SOLN
960.0000 mL | TOPICAL_OIL | ORAL | Status: AC
  Filled 2020-01-30: qty 473

## 2020-01-30 MED ORDER — IOHEXOL 9 MG/ML PO SOLN
500.0000 mL | ORAL | Status: AC
  Administered 2020-01-30 (×2): 500 mL via ORAL

## 2020-01-30 MED ORDER — SORBITOL 70 % SOLN: 960.0000 mL | TOPICAL_OIL | Freq: Once | ORAL | Status: DC

## 2020-01-30 MED ORDER — IOHEXOL 300 MG/ML  SOLN
100.0000 mL | Freq: Once | INTRAMUSCULAR | Status: AC | PRN
  Administered 2020-01-30: 100 mL via INTRAVENOUS

## 2020-01-30 NOTE — Progress Notes (Signed)
PHARMACY - TOTAL PARENTERAL NUTRITION CONSULT NOTE   Indication: Prolonged ileus  Patient Measurements: Height: _0  (154.9 cm) Weight: 72.2 kg (159 lb 2.8 oz) IBW/kg (Calculated) : 47.8   Body mass index is 30.08 kg/m. Usual Weight: 67 kg  Assessment: 59 yo F s/p Sigmoid colon perforation secondary to chronic constipation and ulceration with feculent peritonitis and gross contamination of abdominal cavity with stool balls  -S/p Exploratory laparotomy with partial colectomy and end colostomy 5/5 Dr. Brantley Stage 5/6 PICC placed and started TPN for nutrition support; expect prolonged ileus  Glucose / Insulin: prediabetes, A1c 6.3, CBGs higher with tube feed started 5/13- TF off since 5/16 1800,  range 84-192, MD added 4 units insulin q4hr 5/15 when TF added, TF currently off,   Electrolytes:  Phos remains 5.4, CoCa 10.68 other lytes WNL Renal: Scr WNL, 1750 ml urine charted UOP over past 24hr LFTs / TGs: WNL; Trig 27> 331> 434 > 270 > 210>105, propofol dc'd 5/7, Lipids now in formula SMOF lipid emulsion added 5/12, had been held for first 7 days for critically ill patients per ASPEN guidelines 5/17 AST/ALT sl elevated, ALK phos up to 271 Prealbumin / albumin: prealbumin 9.7 > < 5 > 7.9, reflects inflammation of post op stress and critical illness, admit alb 4.5 WNL, BMI 27.9, well-nourished PTA, poor PO intake for 5 days PTA Intake / Output; MIVF: IVF dc'd Surgeries / Procedures:   -S/p Exploratory laparotomy with partial colectomy and end colostomy 5/5 Dr. Brantley Stage GI Imaging: CT 5/12 -  diffuse inflammatory wall thickening throughout the colon with diffuse fecal material identified, free fluid in the pelvis but no definitive abscess.  Central access: PICC 5/6 for TPN TPN start date: 01/19/2020  Nutritional Goals (per RD recommendation updated on 01/23/2020): kCal: 1379, Protein: 114-130, Fluid: >= 1.3 L/day  Goal TPN rate is 80 mL/hr (provides 115 g of protein and ~1400 kcals per day)  with lipids  Current Nutrition:  TPN at 30 ml/hr Tube feed off since 5/16 at 6 pm -   Goal rate Tube feed 55 ml/hr + Pro-Stat delivers 129 gm protein, 1784 kCal  Plan:  TPN at 40 ml/hr - 1/2 rate per CCS request Electrolytes: 87mq/L of Na, 333m/L of K, 2.59m86mL of Ca, 0 mEq/L of Mg, 0 mmol/L of Phos . Cl:Ac ratio 1:1 Standard MVI and trace elements daily - change to per tube once able Continue q4h SSI,    Monitor TPN labs on Mon/Thurs  MicEudelia [email or web address] 01/30/2020, 9:20 AM  Dedicated ICU phone [phone] only till 2:30pm

## 2020-01-30 NOTE — Progress Notes (Signed)
Patient ID: Darlene Castillo, female   DOB: 04-13-1961, 58 y.o.   MRN: [id number]    12 Days Post-Op  Subjective: Intubated.  Grimaces to palpation of her abdomen and will try to open her eyes to voice  ROS: unable, on vent  Objective: Vital signs in last 24 hours: Temp:  [97.8 F (36.6 C)-101.5 F (38.6 C)] 98.8 F (37.1 C) (05/17 0357) Pulse Rate:  [74-120] 77 (05/17 0600) Resp:  [19-42] 26 (05/17 0600) BP: (100-171)/(35-151) 134/58 (05/17 0600) SpO2:  [95 %-100 %] 99 % (05/17 0740) FiO2 (%):  [40 %] 40 % (05/17 0740) Weight:  [72.2 kg] 72.2 kg (05/17 0418) Last BM Date: 01/29/20  Intake/Output from previous day: 05/16 0701 - 05/17 0700 In: 1580.5 [I.V.:1480.5; IV Piggyback:100] Out: 2200 [Urine:1750; Drains:50; Stool:400] Intake/Output this shift: No intake/output data recorded.  PE: Heart: regular Lungs: on vent Abd: distended and tympanitic.  NGT in place but difficult to flush.  100cc of bilious output in cannister, colostomy stoma is pink and viable with some pudding like stool present.  Wound VAC in place to midline wound.  Lab Results:  Recent Labs    01/29/20 0252 01/30/20 0222  WBC 14.5* 12.0*  HGB 7.4* 7.4*  HCT 23.6* 23.6*  PLT 564* 522*   BMET Recent Labs    01/29/20 0252 01/30/20 0222  NA 143 143  K 5.2* 4.1  CL 110 103  CO2 25 28  GLUCOSE 101* 103*  BUN 43* 36*  CREATININE 0.81 0.81  CALCIUM 8.6* 9.0   PT/INR No results for input(s): LABPROT, INR in the last 72 hours. CMP     Component Value Date/Time   NA 143 01/30/2020 0222   K 4.1 01/30/2020 0222   CL 103 01/30/2020 0222   CO2 28 01/30/2020 0222   GLUCOSE 103 (H) 01/30/2020 0222   BUN 36 (H) 01/30/2020 0222   CREATININE 0.81 01/30/2020 0222   CALCIUM 9.0 01/30/2020 0222   PROT 7.0 01/30/2020 0222   ALBUMIN 1.9 (L) 01/30/2020 0222   AST 66 (H) 01/30/2020 0222   ALT 50 (H) 01/30/2020 0222   ALKPHOS 271 (H) 01/30/2020 0222   BILITOT 0.2 (L) 01/30/2020 0222   GFRNONAA >60  01/30/2020 0222   GFRAA >60 01/30/2020 0222   Lipase     Component Value Date/Time   LIPASE 19 01/18/2020 0525       Studies/Results: DG CHEST PORT 1 VIEW  Result Date: 01/29/2020 CLINICAL DATA:  Possible aspiration EXAM: PORTABLE CHEST 1 VIEW COMPARISON:  01/28/2020, 01/27/2020 FINDINGS: Endotracheal tube tip is less than a cm superior to the carina. Esophageal tube tip is below the diaphragm but incompletely visualized. Left upper extremity central venous catheter tip partially obscured, it projects over the right atrial region. Cardiomegaly with worsened vascular congestion and increased pleural effusions. Extensive hazy bilateral pulmonary opacity likely due to some component of edema. Increased confluent airspace disease at both bases and within the perihilar regions. No pneumothorax. IMPRESSION: 1. Endotracheal tube tip less than a cm superior to the carina 2. Significant worsening of aeration compared to previous exam. New confluent airspace disease at both bases with increase perihilar region consolidations, possible bilateral pneumonia. Borderline to mild cardiomegaly with increased vascular congestion overall diffuse hazy appearance to both thoraces suggesting some component of edema. Suspect that there are pleural effusions as well. Electronically Signed   By: Jasmine Pang M.D.   On: 01/29/2020 20:35   DG CHEST PORT 1 VIEW  Result Date: 01/28/2020 CLINICAL  DATA:  Aspiration. EXAM: PORTABLE CHEST 1 VIEW COMPARISON:  Radiograph yesterday. FINDINGS: Endotracheal tube tip 3.4 cm from the carina. Enteric tube tip below the diaphragm in the stomach. Left upper extremity PICC tip in the right atrium. Low lung volumes. Worsening retrocardiac opacity. Additional streaky perihilar opacities are not significantly changed. No pneumothorax. No large pleural effusion. IMPRESSION: 1. Worsening retrocardiac opacity since yesterday, may be atelectasis, aspiration or pneumonia. 2. Additional streaky  perihilar opacities are unchanged. 3. Stable support apparatus. Electronically Signed   By: Keith Rake M.D.   On: 01/28/2020 21:30   DG Abd Portable 1V  Result Date: 01/29/2020 CLINICAL DATA:  Possible aspiration EXAM: PORTABLE ABDOMEN - 1 VIEW COMPARISON:  01/27/2020, 01/25/2020 FINDINGS: Esophageal tube is coiled within the stomach, the tip projects over the gastric body. Angular appearance of the catheter tubing chest beyond the GE junction. There is similar gaseous dilatation of the bowel. IMPRESSION: 1. Esophageal tube tip overlies the stomach. Possible kinked appearance of the tubing just beyond the GE junction region. 2. Gaseous dilatation of the bowel, obstruction cannot be excluded. Electronically Signed   By: Donavan Foil M.D.   On: 01/29/2020 20:38    Anti-infectives: Anti-infectives (From admission, onward)   Start     Dose/Rate Route Frequency Ordered Stop   01/27/20 1200  cefTRIAXone (ROCEPHIN) 2 g in sodium chloride 0.9 % 100 mL IVPB     2 g 200 mL/hr over 30 Minutes Intravenous Every 24 hours 01/27/20 1041 02/03/20 1159   01/26/20 1000  anidulafungin (ERAXIS) 100 mg in sodium chloride 0.9 % 100 mL IVPB  Status:  Discontinued     100 mg 78 mL/hr over 100 Minutes Intravenous Every 24 hours 01/25/20 0956 01/27/20 1041   01/25/20 1100  anidulafungin (ERAXIS) 200 mg in sodium chloride 0.9 % 200 mL IVPB     200 mg 78 mL/hr over 200 Minutes Intravenous  Once 01/25/20 1006 01/25/20 1525   01/18/20 1400  piperacillin-tazobactam (ZOSYN) IVPB 3.375 g  Status:  Discontinued     3.375 g 12.5 mL/hr over 240 Minutes Intravenous Every 8 hours 01/18/20 1311 01/27/20 1041   01/18/20 0715  piperacillin-tazobactam (ZOSYN) IVPB 3.375 g     3.375 g 100 mL/hr over 30 Minutes Intravenous  Once 01/18/20 0707 01/18/20 0837       Assessment/Plan HTN Prediabetic- A1c6.3although glucose elevated >200,SSI Tobacco abuse 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 CCM discussing trach, ok to proceed from our standpoint when felt indicated by CCM Fever - CT without definite IAA abscess 5/11, Ucx neg, Rcx GNR, Bcx negative. On Rocephin for PNA.  Still febrile overnight to 101.5 Anemia - hgb stable at 7.4 R brachial DVT - lovenox 80mg  BID  Septic shock Sigmoid colon perforation secondary to chronic constipation and ulceration with feculent peritonitis and gross contamination of abdominal cavity with stool balls -S/pExploratory laparotomy with partial colectomy and end colostomy5/5 Dr. Brantley Stage - POD#12 - surgical path:Perforation with acute inflammation and acute serositis, no evidence of malignancy -wound vac MWF - WOC RNfollowingfor new colostomyand vac -WBC down to 12 today - IV zosyn DC - cont NGT today to LIWS and will have RN replace tube to try and get kink out so the tube works better. -plain film shows some distention of her colon.  Ostomy seems to be working. -cont to hold TFs today til we can get  tube better positioned, cont half rate TNA for now  ID -zosyn 5/5>>5/14, eraxis 5/12>>5/14, Rocephin 5/14 --> FEN -NPO/NGTto LIWS,TPN, TFs on hold due to emesis overnight VTE -SCDs, lovenox 80mg  BID Foley -d/c 5/13    LOS: 12 days    6/13 , [facility] 01/30/2020, 8:38 AM Please see Amion for pager number during day hours 7:00am-4:30pm or 7:00am -11:30am on weekends

## 2020-01-30 NOTE — Progress Notes (Addendum)
NAME:  Darlene Castillo, MRN:  [id number], DOB:  [birth year], LOS: 12 ADMISSION DATE:  01/18/2020, CONSULTATION DATE:  01/18/20 REFERRING MD:  Erroll Luna CHIEF COMPLAINT:  VDRF, septic shock  Brief History   59 y/o F admitted 5/5 with abdominal pain, constipation and one episode of vomiting. Found to have a perforated sigmoid colon with peritonitis.  To OR for exploratory laparotomy with partial colectomy and end colostomy (negative pathology), returned to ICU post-operatively on mechanical ventilation.   Past Medical History  Chronic opiate use Tobacco abuse DM HTN  Significant Hospital Events   5/05 Admit, ex lap with partial colectomy and end colostomy for perf:  neg path 5/12 Extubated yesterday evening but in significant respiratory distress and fatigue this morning.  Overnight decided on DO NOT INTUBATE status but husband questioning her capacity this morning.  In conversation with the patient it appears she does not have capacity.  She is not able to comprehend my conversation about intubation.  Husband reports patient is muslim and has intense desire to live.  He does not feel that patient's decision on no intubation is consistent with her values.  He also states it is not in her best interest.   Had a temperature and per CCS has CT scan abdomen did not find any abscess.  Panculture required. Labs probably not accurate - drawn from near TPN  - likely per pharmacist. Needs repeat lab draw 5/13  Back on vent. fever curve better. PCT down to 3.4. Mag 1.8. Concern for A Fib but EKG sinus tach with PAC. Down to 40% fio2 . TF on hold but getting meds via NG. On TPN. On dilaudid gtt, scheduled methadone, versed gtt, clonidine, seroquel and nicotine patch. Started on IV heparin for RUE DVT  5/14 dc versed gtt. Working on weaning sedation. Changing abx to rocephin 5/15 Methadone held at family request but restarted 5/16  Consults:  CCS PCCM  Procedures:  R IJ CVL 5/5 >>  ALine 5/5 >> 5/7  ETT  5/5 >> 5/11, replaced 5/12 >>  LUE PICC 5/6 >>  Significant Diagnostic Tests:  CT abdomen pelvis with contrast 01/17/2020 >> scattered groundglass opacities in the lower lungs bilaterally, no focal opacities.  Abdominal free air.  Multiple dilated loops of bowel with stool.  Large R renal cyst. Path 5/5 >> Perforation with acute inflammation and acute serositis. No evidence of malignancy.   [place] UE 5/13 >> RUE DVT  Micro Data:  Covid 5/5 >> negative Flu PCR 5/5 >> negative MRSA PCR 5/12 >> neg Tracheal aspirate 5/12 >> klebsiella > R ampicillin, Unasyn, I: zosyn Urine analysis 5/12 >> negative  Blood culture 5/12 >>   Antimicrobials:  Zosyn 5/5 >> 5/14 Eraxis 5/12 >> 5/14 Rocephin 5/14 >>  Interim history/subjective:  Afebrile currently, tmax in last 24 hours 101.5 / WBC trend down overall On vent - FiO2 40%, PEEP 8 I/O - 1.7L UOP, -619 ml in 24h Husband at bedside, asks that information be relayed to patients mother who is an Therapist, sports  Objective   Blood pressure (!) 134/58, pulse 77, temperature 98.4 F (36.9 C), temperature source Axillary, resp. rate (!) 26, height 5\' 1"  (1.549 m), weight 72.2 kg, last menstrual period 05/23/2013, SpO2 99 %.    Vent Mode: PRVC FiO2 (%):  [40 %] 40 % Set Rate:  [26 bmp] 26 bmp Vt Set:  [330 mL] 330 mL PEEP:  [5 cmH20-8 cmH20] 8 cmH20 Plateau Pressure:  [22 cmH20-27 cmH20] 22 cmH20  Intake/Output Summary (Last 24 hours) at 01/30/2020 0851 Last data filed at 01/30/2020 0600 Gross per 24 hour  Intake 1431.77 ml  Output 2200 ml  Net -768.23 ml   Filed Weights   01/27/20 0424 01/28/20 0400 01/30/20 0418  Weight: 76.4 kg 76.4 kg 72.2 kg   Exam: General: adult female lying in bed on vent in NAD HEENT: MM pink/moist, ETT, anicteric  Neuro: calm, sedate but will open eyes / nod yes/no, moves all extremities  CV: s1s2 RRR, tachy, no m/r/g PULM:  Non-labored on vent, lungs bilaterally clear GI: soft, hypoactive, midline wound VAC, LLQ  ostomy Extremities: warm/dry, no edema  Skin: no rashes or lesions  Resolved problems  Circulatory/septic shock   Assessment & Plan:  LOS 12 days   Acute hypoxic respiratory failure requiring mechanical ventilation, complicated by Klebsiella PNA.  -PRVC 8cc/kg, lung protective ventilation  -daily SBT / WUA when able  -D4/7 rocephin  -VAP prevention measures  -continue PAD Protocol -will follow up with mother regarding tracheostomy placement to facilitate vent weaning  Acute Metabolic Encephalopathy, started 5/12 Chronic Pain, on oral methadone PTA Difficulties with intermittent agitation, family requests for patient to come off home medications, agitated delirium.  Improved 5/17.   -PAD protocol with precedex, versed, dilaudid -wean sedation as able  -continue clonidine, seroquel    Peritonitis 2/2 Sigmoid Colon Perforation from Chronic Constipation s/p ex lap with sigmoid resection and colostomy. Completed 10 days abx for initial peritonitis.  -post-operative care per CCS  -monitor for evidence of infection  Right Brachial DVT -continue BID lovenox   Moderate Malnutrition Expected prolonged ileus post- op, which is improving. -continue tube feeding per Nutrition / CCS  -TPN until meeting full nutrition needs enterally   Intermittent Fluid and Electrolyte imbalance-net positive volume status over the last few days -hold lasix 5/17 -follow I/O's  Hyperglycemia -SSI with TF coverage   Best practice:  Diet: NPO; TPN & TF Pain/Anxiety/Delirium protocol (if indicated): see above VAP protocol (if indicated): In place DVT prophylaxis: SCDs and lovenox GI prophylaxis: pepcid Glucose control: SSI Mobility: bedrest Code Status: full Family Communication: Husband updated at bedside.  Will call mother for update 5/17 Disposition: ICU   BMP Latest Ref Rng & Units 01/30/2020 01/29/2020 01/27/2020  Glucose 70 - 99 mg/dL 329(J) 188(C) 166(A)  BUN 6 - 20 mg/dL 63(K) 16(W)  10(X)  Creatinine 0.44 - 1.00 mg/dL 3.23 5.57 3.22  Sodium 135 - 145 mmol/L 143 143 137  Potassium 3.5 - 5.1 mmol/L 4.1 5.2(H) 4.6  Chloride 98 - 111 mmol/L 103 110 103  CO2 22 - 32 mmol/L 28 25 26   Calcium 8.9 - 10.3 mg/dL 9.0 ) 8.1(L)    CBC    Component Value Date/Time   WBC 12.0 (H) 01/30/2020 0222   RBC 2.78 (L) 01/30/2020 0222   HGB 7.4 (L) 01/30/2020 0222   HCT 23.6 (L) 01/30/2020 0222   PLT 522 (H) 01/30/2020 0222   MCV 84.9 01/30/2020 0222   MCH 26.6 01/30/2020 0222   MCHC 31.4 01/30/2020 0222   RDW 14.6 01/30/2020 0222   LYMPHSABS 1.6 01/30/2020 0222   MONOABS 1.0 01/30/2020 0222   EOSABS 0.2 01/30/2020 0222   BASOSABS 0.1 01/30/2020 0222    Critical Care Time: 38 minutes  02/01/2020, MSN, NP-C [facility] 01/30/2020, 8:52 AM   Please see [email or web address] for pager details.

## 2020-01-30 NOTE — TOC Progression Note (Signed)
Transition of Care [facility]) - Progression Note    Patient Details  Name: Darlene Castillo MRN: [id number] Date of Birth: [birth year]  [facility] of [facility]) CM/SW Contact  Golda Acre, RN Phone Number: 01/30/2020, 8:33 AM  Clinical Narrative:    Remains on the vent. Temp 101.5, iv meds=rocephin, precedex, versed, dilaqudid, t\pn, wbc=12.0, hgb 7.4, remains sedated.   Expected Discharge Plan: Home/Self Care Barriers to Discharge: Continued Medical Work up  Expected Discharge Plan and Services Expected Discharge Plan: Home/Self Care       Living arrangements for the past 2 months: Single Family Home                                       Social Determinants of Health (SDOH) Interventions    Readmission Risk Interventions No flowsheet data found.

## 2020-01-30 NOTE — Progress Notes (Signed)
Mother called for daily update and regarding tracheostomy placement. Explained concept of tracheostomy and plan for Wednesday or Thursday placement. Mother will talk with patients husband and we will revisit timing / consent.    Canary Brim, MSN, NP-C [facility] 01/30/2020, 3:55 PM   Please see [email or web address] for pager details.

## 2020-01-30 NOTE — Consult Note (Signed)
WOC Nurse wound follow up Wound type: Surgical  Measurement: 16cm x 6cm x 3.5cm Wound bed: red, moist Drainage (amount, consistency, odor) serosanguinous Periwound: intact Dressing procedure/placement/frequency: Patient is sedated, methadone given 30 minutes prior to treatment, medicated during dressing change for restlessness, agitation and mild combative activity consisting of flailing arms and legs. One black foam removed from previous dressing at midline, wound cleansed and one piece of black foam used to obliterate dead space; 90% of wound bed is covered. Skin barrier ring applied to most distal part of wound to enhance seal. Drape applied and dressing is attached to continuous negative pressure.  An immediate seal is achieved. Next dressing change is due on Wednesday.  My partner will change this dressing in my absence.  WOC Nurse ostomy follow up Stoma type/location: LLQ Colostomy  Stomal assessment/size: Slightly larger than 1 and 3/4 inch oval stoma that is red, raised and edematous. Lumen at center. Peristomal assessment: intact Treatment options for stomal/peristomal skin: skin barrier ring Output: liquid brown stool emptied from old pouch Ostomy pouching: 2pc. 2 and 3/4 inch pouching system with skin barrier ring Education provided: None. Enrolled patient in [place] Secure [employer] Discharge program: Yes, previously   WOC nursing team will follow and will remain available to this patient, the nursing and medical teams.   Thanks, Ladona Mow, MSN, RN, GNP, [place]  Pager# [phone]

## 2020-01-31 ENCOUNTER — Inpatient Hospital Stay (HOSPITAL_COMMUNITY): Payer: BLUE CROSS/BLUE SHIELD

## 2020-01-31 LAB — BASIC METABOLIC PANEL
Anion gap: 11 (ref 5–15)
BUN: 29 mg/dL — ABNORMAL HIGH (ref 6–20)
CO2: 27 mmol/L (ref 22–32)
Calcium: 8.4 mg/dL — ABNORMAL LOW (ref 8.9–10.3)
Chloride: 100 mmol/L (ref 98–111)
Creatinine, Ser: 0.62 mg/dL (ref 0.44–1.00)
GFR calc Af Amer: >60 mL/min (ref >60)
GFR calc non Af Amer: >60 mL/min (ref >60)
Glucose, Bld: 128 mg/dL — ABNORMAL HIGH (ref 70–99)
Potassium: 3.8 mmol/L (ref 3.5–5.1)
Sodium: 138 mmol/L (ref 135–145)

## 2020-01-31 LAB — RETICULOCYTES
Immature Retic Fract: 9.1 % (ref 2.3–15.9)
RBC.: 2.48 MIL/uL — ABNORMAL LOW (ref 3.87–5.11)
Retic Count, Absolute: 31.2 10*3/uL (ref 19.0–186.0)
Retic Ct Pct: 1.3 % (ref 0.4–3.1)

## 2020-01-31 LAB — HEMOGLOBIN AND HEMATOCRIT, BLOOD
HCT: 24.3 % — ABNORMAL LOW (ref 36.0–46.0)
Hemoglobin: 7.8 g/dL — ABNORMAL LOW (ref 12.0–15.0)

## 2020-01-31 LAB — CBC WITH DIFFERENTIAL/PLATELET
Abs Immature Granulocytes: 0.12 10*3/uL — ABNORMAL HIGH (ref 0.00–0.07)
Basophils Absolute: 0 10*3/uL (ref 0.0–0.1)
Basophils Relative: 0 %
Eosinophils Absolute: 0.1 10*3/uL (ref 0.0–0.5)
Eosinophils Relative: 1 %
HCT: 21.2 % — ABNORMAL LOW (ref 36.0–46.0)
Hemoglobin: 6.7 g/dL — CL (ref 12.0–15.0)
Immature Granulocytes: 1 %
Lymphocytes Relative: 8 %
Lymphs Abs: 0.9 10*3/uL (ref 0.7–4.0)
MCH: 26.8 pg (ref 26.0–34.0)
MCHC: 31.6 g/dL (ref 30.0–36.0)
MCV: 84.8 fL (ref 80.0–100.0)
Monocytes Absolute: 0.9 10*3/uL (ref 0.1–1.0)
Monocytes Relative: 7 %
Neutro Abs: 9.6 10*3/uL — ABNORMAL HIGH (ref 1.7–7.7)
Neutrophils Relative %: 83 %
Platelets: 836 10*3/uL — ABNORMAL HIGH (ref 150–400)
RBC: 2.5 MIL/uL — ABNORMAL LOW (ref 3.87–5.11)
RDW: 14.1 % (ref 11.5–15.5)
WBC: 11.6 10*3/uL — ABNORMAL HIGH (ref 4.0–10.5)
nRBC: 0.4 % — ABNORMAL HIGH (ref 0.0–0.2)

## 2020-01-31 LAB — GLUCOSE, CAPILLARY
Glucose-Capillary: 105 mg/dL — ABNORMAL HIGH (ref 70–99)
Glucose-Capillary: 108 mg/dL — ABNORMAL HIGH (ref 70–99)
Glucose-Capillary: 111 mg/dL — ABNORMAL HIGH (ref 70–99)
Glucose-Capillary: 115 mg/dL — ABNORMAL HIGH (ref 70–99)
Glucose-Capillary: 120 mg/dL — ABNORMAL HIGH (ref 70–99)
Glucose-Capillary: 121 mg/dL — ABNORMAL HIGH (ref 70–99)
Glucose-Capillary: 121 mg/dL — ABNORMAL HIGH (ref 70–99)
Glucose-Capillary: 127 mg/dL — ABNORMAL HIGH (ref 70–99)
Glucose-Capillary: 155 mg/dL — ABNORMAL HIGH (ref 70–99)
Glucose-Capillary: 161 mg/dL — ABNORMAL HIGH (ref 70–99)

## 2020-01-31 LAB — IRON AND TIBC
Iron: 7 ug/dL — ABNORMAL LOW (ref 28–170)
Saturation Ratios: 5 % — ABNORMAL LOW (ref 10.4–31.8)
TIBC: 151 ug/dL — ABNORMAL LOW (ref 250–450)
UIBC: 144 ug/dL

## 2020-01-31 LAB — CBC
HCT: 22.7 % — ABNORMAL LOW (ref 36.0–46.0)
Hemoglobin: 7.2 g/dL — ABNORMAL LOW (ref 12.0–15.0)
MCH: 26.6 pg (ref 26.0–34.0)
MCHC: 31.7 g/dL (ref 30.0–36.0)
MCV: 83.8 fL (ref 80.0–100.0)
Platelets: 910 10*3/uL (ref 150–400)
RBC: 2.71 MIL/uL — ABNORMAL LOW (ref 3.87–5.11)
RDW: 14.1 % (ref 11.5–15.5)
WBC: 11.9 10*3/uL — ABNORMAL HIGH (ref 4.0–10.5)
nRBC: 0.4 % — ABNORMAL HIGH (ref 0.0–0.2)

## 2020-01-31 LAB — PREPARE RBC (CROSSMATCH)

## 2020-01-31 LAB — MAGNESIUM: Magnesium: 2 mg/dL (ref 1.7–2.4)

## 2020-01-31 LAB — PHOSPHORUS: Phosphorus: 5 mg/dL — ABNORMAL HIGH (ref 2.5–4.6)

## 2020-01-31 MED ORDER — ENOXAPARIN SODIUM 80 MG/0.8ML ~~LOC~~ SOLN
1.0000 mg/kg | Freq: Two times a day (BID) | SUBCUTANEOUS | Status: DC
  Filled 2020-01-31: qty 0.7

## 2020-01-31 MED ORDER — PRO-STAT SUGAR FREE PO LIQD
30.0000 mL | Freq: Every day | ORAL | Status: DC
  Administered 2020-02-01 – 2020-02-02 (×2): 30 mL
  Filled 2020-01-31 (×3): qty 30

## 2020-01-31 MED ORDER — SODIUM CHLORIDE 0.9 % IV SOLN
200.0000 mg | Freq: Once | INTRAVENOUS | Status: AC
  Administered 2020-01-31: 200 mg via INTRAVENOUS
  Filled 2020-01-31: qty 200

## 2020-01-31 MED ORDER — SODIUM CHLORIDE 0.9% IV SOLUTION: Freq: Once | INTRAVENOUS | Status: AC

## 2020-01-31 MED ORDER — POTASSIUM CHLORIDE 20 MEQ/15ML (10%) PO SOLN
20.0000 meq | Freq: Once | ORAL | Status: AC
  Administered 2020-01-31: 20 meq
  Filled 2020-01-31: qty 15

## 2020-01-31 MED ORDER — TRAVASOL 10 % IV SOLN
INTRAVENOUS | Status: AC
  Filled 2020-01-31: qty 576

## 2020-01-31 MED ORDER — VECURONIUM BROMIDE 10 MG IV SOLR: 10.0000 mg | Freq: Once | INTRAVENOUS | Status: DC

## 2020-01-31 MED ORDER — MIDAZOLAM HCL 2 MG/2ML IJ SOLN
INTRAMUSCULAR | Status: AC
  Filled 2020-01-31: qty 2

## 2020-01-31 MED ORDER — MIDAZOLAM HCL 2 MG/2ML IJ SOLN
2.0000 mg | Freq: Once | INTRAMUSCULAR | Status: AC
  Administered 2020-01-31: 2 mg via INTRAVENOUS

## 2020-01-31 MED ORDER — ETOMIDATE 2 MG/ML IV SOLN
40.0000 mg | Freq: Once | INTRAVENOUS | Status: DC
  Filled 2020-01-31: qty 20

## 2020-01-31 MED ORDER — SODIUM CHLORIDE 0.9 % IV SOLN
100.0000 mg | INTRAVENOUS | Status: DC
  Administered 2020-02-01: 100 mg via INTRAVENOUS
  Filled 2020-01-31 (×2): qty 100

## 2020-01-31 MED ORDER — VITAL AF 1.2 CAL PO LIQD
1000.0000 mL | ORAL | Status: DC
  Administered 2020-01-31 – 2020-02-02 (×5): 1000 mL

## 2020-01-31 MED ORDER — PROPOFOL 10 MG/ML IV BOLUS: 500.0000 mg | Freq: Once | INTRAVENOUS | Status: DC

## 2020-01-31 MED ORDER — MIDAZOLAM HCL 2 MG/2ML IJ SOLN
5.0000 mg | Freq: Once | INTRAMUSCULAR | Status: DC
  Filled 2020-01-31: qty 6

## 2020-01-31 MED ORDER — FENTANYL CITRATE (PF) 100 MCG/2ML IJ SOLN
200.0000 ug | Freq: Once | INTRAMUSCULAR | Status: DC
  Filled 2020-01-31: qty 4

## 2020-01-31 MED ORDER — ENOXAPARIN SODIUM 80 MG/0.8ML ~~LOC~~ SOLN: 1.0000 mg/kg | Freq: Two times a day (BID) | SUBCUTANEOUS | Status: DC

## 2020-01-31 NOTE — Progress Notes (Signed)
Patient ID: Darlene Castillo, female   DOB: Oct 23, 1960, 59 y.o.   MRN: [id number]    13 Days Post-Op  Subjective: On vent.  Opens eyes to voice and name.  Wiggles toes  ROS: unable, on vent  Objective: Vital signs in last 24 hours: Temp:  [99.1 F (37.3 C)-101.6 F (38.7 C)] 99.6 F (37.6 C) (05/18 0800) Pulse Rate:  [80-135] 135 (05/18 0500) Resp:  [26-33] 33 (05/18 0500) BP: (104-166)/(44-139) 108/67 (05/18 0500) SpO2:  [91 %-100 %] 99 % (05/18 0751) FiO2 (%):  [40 %] 40 % (05/18 0751) Last BM Date: 01/30/20  Intake/Output from previous day: 05/17 0701 - 05/18 0700 In: 1614.5 [I.V.:1614.5] Out: 2300 [Urine:1400; Emesis/NG output:900] Intake/Output this shift: No intake/output data recorded.  PE: Heart: regular, mildly tachy Lungs: on vent Abd: distended, some BS, ostomy with no output today.  Stoma is pink and viable.  Midline wound with VAC in place  Lab Results:  Recent Labs    01/31/20 0408 01/31/20 0701  WBC 11.9* 11.6*  HGB 7.2* 6.7*  HCT 22.7* 21.2*  PLT 910* 836*   BMET Recent Labs    01/30/20 0222 01/31/20 0408  NA 143 138  K 4.1 3.8  CL 103 100  CO2 28 27  GLUCOSE 103* 128*  BUN 36* 29*  CREATININE 0.81 0.62  CALCIUM 9.0 8.4*   PT/INR No results for input(s): LABPROT, INR in the last 72 hours. CMP     Component Value Date/Time   NA 138 01/31/2020 0408   K 3.8 01/31/2020 0408   CL 100 01/31/2020 0408   CO2 27 01/31/2020 0408   GLUCOSE 128 (H) 01/31/2020 0408   BUN 29 (H) 01/31/2020 0408   CREATININE 0.62 01/31/2020 0408   CALCIUM 8.4 (L) 01/31/2020 0408   PROT 7.0 01/30/2020 0222   ALBUMIN 1.9 (L) 01/30/2020 0222   AST 66 (H) 01/30/2020 0222   ALT 50 (H) 01/30/2020 0222   ALKPHOS 271 (H) 01/30/2020 0222   BILITOT 0.2 (L) 01/30/2020 0222   GFRNONAA >60 01/31/2020 0408   GFRAA >60 01/31/2020 0408   Lipase     Component Value Date/Time   LIPASE 19 01/18/2020 0525       Studies/Results: CT ABDOMEN PELVIS W CONTRAST  Result  Date: 01/30/2020 CLINICAL DATA:  Abdominal abscess or infection suspected status post Hartmann's for stercoral poor for a shin. EXAM: CT ABDOMEN AND PELVIS WITH CONTRAST TECHNIQUE: Multidetector CT imaging of the abdomen and pelvis was performed using the standard protocol following bolus administration of intravenous contrast. CONTRAST:  OMNIPAQUE IOHEXOL 300 MG/ML  SOLN COMPARISON:  01/18/2020 and 01/24/2020 FINDINGS: Lower chest: Small effusions and basilar airspace disease worse on the LEFT than the RIGHT with near complete collapse or consolidation of the LEFT lung base. Hepatobiliary: Loculated perihepatic fluid is similar to the prior study. No focal parenchymal abnormality. The gallbladder is under distended and there is no biliary ductal dilation. Portal vein is patent. Pancreas: Pancreas is unremarkable without ductal dilation, inflammation or lesion. Spleen: Spleen is normal size.  No focal lesion. Adrenals/Urinary Tract: Adrenal glands are normal. Kidneys enhance symmetrically. Large renal cyst on the RIGHT is unchanged. No hydronephrosis. Urinary bladder with mild thickening along the superior margin potentially related to recent surgery of the adjacent colon. Stomach/Bowel: Gastric tube in place, tip in the mid stomach. No extraluminal contrast material is noted on today's scan. The small bowel is only minimally dilated. Dilute contrast begins to fill the colon with partially formed  stool throughout the colon. Transverse colon is moderately dilated measuring approximately 5 cm, similar caliber in the axial plain though with less wall thickening than on the prior exam of 01/24/2020. In coronal there is increase in transverse colonic diameter approximately 6 cm. Heart Rexanne Mano pouch is demonstrated. This terminates in the upper pelvis. Vascular/Lymphatic: Calcified and noncalcified plaque in the abdominal aorta without sign of aneurysm. No adenopathy in the retroperitoneum or in the upper abdomen. No  pelvic lymphadenopathy. Reproductive: Uterus remains in situ. No adnexal mass. Fluid contacts the anterior superior margin of the uterus and layers atop the urinary bladder. Other: Fluid in the pelvis shows more peripheral enhancement than on the previous study. Loculated interloop fluid and fluid anterior to bowel loops in the LEFT lower quadrant with more organized appearance and slight increased compared to the previous exam, for instance on image 61 of series 2 an area of measuring approximately 9 x 4 cm previously 7.1 x 3.8 cm. This is just below, inferior to the colostomy site and is tracking between small bowel loops and the lower margin of the colon as a turns towards the ostomy in this location. Fluid in the cul-de-sac in the pelvis measures 6.2 x 2.5 cm as compared to 6.3 x 2.7 cm, this area has diminished slightly in size compared to the previous exam. Small loculated areas along the RIGHT hemi liver as discussed above similar to the prior study. Diffuse mesenteric edema and interloop fluid. Small pockets of interloop fluid and generalized edema may be slightly diminished compared to the prior study. Midline postoperative changes with wound VAC overlying the site. Musculoskeletal: No acute bone finding. No destructive bone process. IMPRESSION: 1. Increasingly organized appearance of LEFT lower quadrant and pelvic fluid with slight decrease in the deep pelvic fluid and slight increase in LEFT lower quadrant fluid. Interloop fluid in the LEFT hemiabdomen is slightly increased overall. 2. Diffuse interloop fluid and mesenteric stranding is slightly diminished. 3. Persistent small areas of loculated perihepatic fluid unchanged, largest area in the axial plane measuring approximately 2.5 by 1.7 cm. Decreased formed stool in the colon but with slight increase in colonic distension particularly of the transverse colon when compared to the prior study. This may reflect colonic ileus, attention on follow-up 4.  Persistent basilar consolidation and pleural effusions without significant change. Electronically Signed   By: Donzetta Kohut M.D.   On: 01/30/2020 14:58   DG CHEST PORT 1 VIEW  Result Date: 01/31/2020 CLINICAL DATA:  Acute respiratory failure with hypoxia EXAM: PORTABLE CHEST 1 VIEW COMPARISON:  Yesterday FINDINGS: Endotracheal tube tip is just below the clavicular heads. The enteric tube at least reaches the stomach. Left PICC with tip near the upper cavoatrial junction, partially overlapped by EKG leads. Unchanged hazy and streaky pulmonary opacity including left pleural effusion. No visible pneumothorax. Normal heart size. IMPRESSION: Stable hardware positioning and pleural effusion with pulmonary opacity. Atelectasis was seen at the lung bases on abdominal CT yesterday. Electronically Signed   By: Marnee Spring M.D.   On: 01/31/2020 07:28   DG CHEST PORT 1 VIEW  Result Date: 01/29/2020 CLINICAL DATA:  Possible aspiration EXAM: PORTABLE CHEST 1 VIEW COMPARISON:  01/28/2020, 01/27/2020 FINDINGS: Endotracheal tube tip is less than a cm superior to the carina. Esophageal tube tip is below the diaphragm but incompletely visualized. Left upper extremity central venous catheter tip partially obscured, it projects over the right atrial region. Cardiomegaly with worsened vascular congestion and increased pleural effusions. Extensive hazy bilateral pulmonary opacity  likely due to some component of edema. Increased confluent airspace disease at both bases and within the perihilar regions. No pneumothorax. IMPRESSION: 1. Endotracheal tube tip less than a cm superior to the carina 2. Significant worsening of aeration compared to previous exam. New confluent airspace disease at both bases with increase perihilar region consolidations, possible bilateral pneumonia. Borderline to mild cardiomegaly with increased vascular congestion overall diffuse hazy appearance to both thoraces suggesting some component of edema.  Suspect that there are pleural effusions as well. Electronically Signed   By: Donavan Foil M.D.   On: 01/29/2020 20:35   DG Abd Portable 1V  Result Date: 01/30/2020 CLINICAL DATA:  Check gastric catheter placement EXAM: PORTABLE ABDOMEN - 1 VIEW COMPARISON:  01/29/2020 FINDINGS: Gastric catheter is again noted coiled within the stomach. The overall appearance is stable when compare with the prior exam. Scattered large and small bowel gas is noted. No free air is seen. IMPRESSION: Stable gastric catheter as described. Electronically Signed   By: Inez Catalina M.D.   On: 01/30/2020 12:21   DG Abd Portable 1V  Result Date: 01/29/2020 CLINICAL DATA:  Possible aspiration EXAM: PORTABLE ABDOMEN - 1 VIEW COMPARISON:  01/27/2020, 01/25/2020 FINDINGS: Esophageal tube is coiled within the stomach, the tip projects over the gastric body. Angular appearance of the catheter tubing chest beyond the GE junction. There is similar gaseous dilatation of the bowel. IMPRESSION: 1. Esophageal tube tip overlies the stomach. Possible kinked appearance of the tubing just beyond the GE junction region. 2. Gaseous dilatation of the bowel, obstruction cannot be excluded. Electronically Signed   By: Donavan Foil M.D.   On: 01/29/2020 20:38    Anti-infectives: Anti-infectives (From admission, onward)   Start     Dose/Rate Route Frequency Ordered Stop   01/27/20 1200  cefTRIAXone (ROCEPHIN) 2 g in sodium chloride 0.9 % 100 mL IVPB     2 g 200 mL/hr over 30 Minutes Intravenous Every 24 hours 01/27/20 1041 02/03/20 1159   01/26/20 1000  anidulafungin (ERAXIS) 100 mg in sodium chloride 0.9 % 100 mL IVPB  Status:  Discontinued     100 mg 78 mL/hr over 100 Minutes Intravenous Every 24 hours 01/25/20 0956 01/27/20 1041   01/25/20 1100  anidulafungin (ERAXIS) 200 mg in sodium chloride 0.9 % 200 mL IVPB     200 mg 78 mL/hr over 200 Minutes Intravenous  Once 01/25/20 1006 01/25/20 1525   01/18/20 1400  piperacillin-tazobactam  (ZOSYN) IVPB 3.375 g  Status:  Discontinued     3.375 g 12.5 mL/hr over 240 Minutes Intravenous Every 8 hours 01/18/20 1311 01/27/20 1041   01/18/20 0715  piperacillin-tazobactam (ZOSYN) IVPB 3.375 g     3.375 g 100 mL/hr over 30 Minutes Intravenous  Once 01/18/20 0707 01/18/20 0837       Assessment/Plan HTN Prediabetic- A1c6.3,SSI Tobacco abuse 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 Trach tomorrow am at 7:30 per CCM Fever - cyclic, mostly over night.  Highest 101.6.  On Rocephin for VAP.  CT A/P yesterday showed some fluid in the abdomen, relatively stable from a week ago, but with slightly more enhancing.  If WBC begins to rise or fevers do not dissipate, may consider asking IR to evaluate for drainage.  Will follow Anemia - hgb 6.7 this am.  tx 1 unit of pRBCs R brachial DVT - lovenox  70mg  BID, CCM to hold prior to trach in am  Septic shock Sigmoid colon perforation secondary to chronic constipation and ulceration with feculent peritonitis and gross contamination of abdominal cavity with stool balls -S/pExploratory laparotomy with partial colectomy and end colostomy5/5 Dr. - POD#13 - surgical path:Perforation with acute inflammation and acute serositis, no evidence of malignancy -wound vac MWF - WOC RNfollowingfor new colostomyand vac -WBC down to 11 today - NGT clamped and will restart TFs today.  Cont 1/2 rate TNA today and if tolerates TFs will DC tomorrow -CT A/P shows a significant stool burden in her colon with distention.  Will have WOC given SMOG enema through stoma today to help clear some of this out to get her bowels working better.  ID -zosyn 5/5>>5/14, eraxis 5/12>>5/14, Rocephin 5/14 --> FEN -NGT/TFs, TNA 1/2 rate for today VTE -SCDs, lovenox 70mg  BID Foley -d/c  5/13   LOS: 13 days    , [facility] 01/31/2020, 8:51 AM Please see Amion for pager number during day hours 7:00am-4:30pm or 7:00am -11:30am on weekends

## 2020-01-31 NOTE — Progress Notes (Signed)
CRITICAL VALUE ALERT  Critical Value:  PLATELETS - 910  Date & Time Notied:  01/31/2020 @ 0500  Provider Notified: Glade Nurse NURSE  Orders Received/Actions taken: TBD

## 2020-01-31 NOTE — Consult Note (Signed)
WOC Nurse ostomy follow up: SMOG Enema  Stoma type/location: LLQ colostomy. Pouching system applied yesterday is intact. I spoke with Jettie Pagan, CCS PA yesterday and she requested the administration of a SMOG enema this morning. It is at bedside and I will administer today. Stomal assessment/size: 1 and 3/4 inches, lumen at center Peristomal assessment: Not seen today Treatment options for stomal/peristomal skin: skin barrier ring Output: Small amount liquid brown stool  Ostomy pouching: 2pc. 2 and 3/4 inch pouching system with skin barrier ring Education provided: None.  Patient sedated and on vent.   Enrolled patient in [place] [facility] Discharge program: Yes, previously  SMOG enema placed into colostomy irrigation bag, 2 and 3/4 inch irrigation sleeve attached to patient's skin barrier. Stoma digitalized to determine direction of colon and it is noted to be toward 2 o'clock.  of irrigant is instilled and patient is allow to accommodate for 20 minutes.  Another 250 of irrigant is instilled and patient given another 10 minutes to accommodate.  Patient's family is in and talking to patient, and she becomes restless and agitated. Bedside RN, Lubertha South is supportive and provides additional sedation.  During a calm period, another is instilled for a total of .  Patient with brown liquid out with one piece of hard stool measuring 3cm round:  stool, 850 of returned irrigant. System is disconnected and cleansed, new pouch is attached to skin barrier. Patient tolerated procedure well.   Jettie Pagan, CCS PA is given a face-to-face report on the procedure/outcome.  WOC nursing team will follow, and will remain available to this patient, the nursing and medical teams.   Thanks, Ladona Mow, MSN, RN, GNP, [place]  Pager# [phone]

## 2020-01-31 NOTE — Progress Notes (Signed)
CRITICAL VALUE ALERT  Critical Value:  Hgb 6.7  Date & Time Notied:  5/18 0732   Provider Notified: Isaiah Serge MD  Orders Received/Actions taken: Awaiting new orders

## 2020-01-31 NOTE — Progress Notes (Addendum)
NAME:  Darlene Castillo, MRN:  [id number], DOB:  03-31-1961, LOS: 13 ADMISSION DATE:  01/18/2020, CONSULTATION DATE:  01/18/20 REFERRING MD:  Harriette Bouillon CHIEF COMPLAINT:  VDRF, septic shock  Brief History   59 y/o F admitted 5/5 with abdominal pain, constipation and one episode of vomiting. Found to have a perforated sigmoid colon with peritonitis.  To OR for exploratory laparotomy with partial colectomy and end colostomy (negative pathology), returned to ICU post-operatively on mechanical ventilation.   Past Medical History  Chronic opiate use Tobacco abuse DM HTN  Significant Hospital Events   5/05 Admit, ex lap with partial colectomy and end colostomy for perf:  neg path 5/11 Extubated 5/12 Failed extubation.  Initially DNI but reversed.   5/13  Back on vent. Fever curve better. Concern for A Fib but EKG sinus tach with PAC. Down to 40% fio2 . TF on hold but getting meds via NG. On TPN. On dilaudid gtt, scheduled methadone, versed gtt, clonidine, seroquel and nicotine patch. Started on IV heparin for RUE DVT  5/14 dc versed gtt. Working on weaning sedation. Changing abx to rocephin 5/15 Methadone held at family request  5/16 Restarted methadone due to significant agitation  Consults:  CCS PCCM  Procedures:  R IJ CVL 5/5 >>  ALine 5/5 >> 5/7  ETT 5/5 >> 5/11, replaced 5/12 >>  LUE PICC 5/6 >>  Significant Diagnostic Tests:   CT abdomen pelvis with contrast 01/17/2020 >> scattered groundglass opacities in the lower lungs bilaterally, no focal opacities.  Abdominal free air.  Multiple dilated loops of bowel with stool.  Large R renal cyst.  Path 5/5 >> Perforation with acute inflammation and acute serositis. No evidence of malignancy.    [place] UE 5/13 >> RUE DVT  CT ABD / Pelvis w Contrast 5/17 >> increasingly organized apearance of LLQ & pelvic fluid with slight decrease in the deep pelvic fluid.  Slight increase in LLQ fluid. Interloop fluid in the left hemiabdomen is slightly  increased overall.  Diffuse interloop fluid and mesenteric stranding is slightly diminished. Persistent small areas of loculated perihepatic fluid unchanged, largest area in the axial plane measureing 2.5 x 1.7 cm.  Decreased formed stool in the colon but with slight increase in colonic distention particularly of the transverse colon.  May reflect colonic ileus.  Persistent bibasilar consolidation and pleural effusions without significant change.   Micro Data:  Covid 5/5 >> negative Flu PCR 5/5 >> negative MRSA PCR 5/12 >> neg Tracheal aspirate 5/12 >> klebsiella > R ampicillin, Unasyn, I: zosyn Urine analysis 5/12 >> negative  Blood culture 5/12 >> negative  BCx2 5/18 >>  Antimicrobials:  Zosyn 5/5 >> 5/14 Eraxis 5/12 >> 5/14 Rocephin 5/14 >> Eraxis 5/18 >>   Interim history/subjective:  Tmax 101.6 / WBC continues to improve - down to 11.6 On vent - 40% fiO2, PEEP 5 Fails SBT when turned over, RR increased to 50's I/O - UOP 1.4L, -685 in 24h  Objective   Blood pressure 108/67, pulse (!) 135, temperature 99.6 F (37.6 C), temperature source Axillary, resp. rate (!) 33, height 5\' 1"  (1.549 m), weight 72.2 kg, last menstrual period 05/23/2013, SpO2 99 %.    Vent Mode: PRVC FiO2 (%):  [40 %] 40 % Set Rate:  [26 bmp] 26 bmp Vt Set:  [330 mL] 330 mL PEEP:  [5 cmH20] 5 cmH20 Plateau Pressure:  [22 cmH20-24 cmH20] 23 cmH20   Intake/Output Summary (Last 24 hours) at 01/31/2020 0805 Last data filed  at 01/31/2020 0600 Gross per 24 hour  Intake 1434.39 ml  Output 2300 ml  Net -865.61 ml   Filed Weights   01/27/20 0424 01/28/20 0400 01/30/20 0418  Weight: 76.4 kg 76.4 kg 72.2 kg   Exam: General: ill appearing adult female lying in bed in NAD on vent HEENT: MM pink/moist, ETT, anicteric  Neuro: sedate, awakens to voice, follows commands / nods yes/no CV: s1s2 rrr, tachy, no m/r/g PULM: non-labored but tachypnea, lungs bilaterally coarse, scattered wheeze on right  GI: soft, bsx4  hypoactive, midline VAC in place, LLQ ostomy Extremities: warm/dry, no edema  Skin: no rashes or lesions  Resolved problems  Circulatory/septic shock  Assessment & Plan:  LOS 13 days   Acute hypoxic respiratory failure requiring mechanical ventilation, complicated by Klebsiella PNA.  -PRVC 7cc/kg, rate 26 for lung protective ventilation  -continue daily SBT / WUA  -D5/7 rocephin  -VAP prevention measures  -PAD protocol  -will consent husband on arrival for tracheostomy in am -daily WUA / SBT > failed this am with rate into the 45'Y  Acute Metabolic Encephalopathy, started 5/12 Chronic Pain, on oral methadone PTA Difficulties with intermittent agitation, family requests for patient to come off home medications, agitated delirium.  Improved 5/17.   -PAD protocol with precedex, versed, dilaudid  -wean sedation as able  -hopeful once trach in place, she will be able to come off sedation  -continue clonidine, seroquel   Peritonitis 2/2 Sigmoid Colon Perforation from Chronic Constipation s/p ex lap with sigmoid resection and colostomy. Completed 10 days abx for initial peritonitis.  -post-op care per CCS -monitor for evidence of infection   Right Brachial DVT -hold BID lovenox 5/18 in anticipation of tracheostomy 5/19 am   Moderate Malnutrition Expected prolonged ileus post- op, which is improving. -TF / TPN per CCS / Nutrition   Intermittent Fluid and Electrolyte imbalance-net positive volume status over the last few days -follow electrolytes, replace as indicated   Hyperglycemia -SSI with TF coverage    Anemia  Suspect multifactorial in setting of critical illness, recurrent phlebotomy. No acute evidence of bleeding  -trend CBC  -transfuse 1 unit PRBC this am   Fever  -resume eraxis  -continue rocephin for klebsiella  -no foley  -? Abdominal source with "increasingly organized fluid" noted in LLQ on CT scan -repeat blood cultures   Best practice:  Diet: NPO; TPN &  TF Pain/Anxiety/Delirium protocol (if indicated): see above VAP protocol (if indicated): In place DVT prophylaxis: SCDs and lovenox GI prophylaxis: pepcid Glucose control: SSI Mobility: bedrest Code Status: full Family Communication: Mother updated via phone 5/18.  Will update husband on arrival.  Apparently, they are not legally married but have been together for 15 years and are married within the [employer] faith.  Mother and husband on same page in terms of plan of care. He defers to her for decisions.  Disposition: ICU   BMP Latest Ref Rng & Units 01/31/2020 01/30/2020 01/29/2020  Glucose 70 - 99 mg/dL 128(H) 103(H) 101(H)  BUN 6 - 20 mg/dL 29(H) 36(H) 43(H)  Creatinine 0.44 - 1.00 mg/dL 0.62 0.81 0.81  Sodium 135 - 145 mmol/L 138 143 143  Potassium 3.5 - 5.1 mmol/L 3.8 4.1 5.2(H)  Chloride 98 - 111 mmol/L 100 103 110  CO2 22 - 32 mmol/L 27 28 25   Calcium 8.9 - 10.3 mg/dL 8.4(L) 9.0 8.6(L)    CBC    Component Value Date/Time   WBC 11.6 (H) 01/31/2020 0701   RBC 2.50 (  L) 01/31/2020 0701   RBC 2.48 (L) 01/31/2020 0701   HGB 6.7 (LL) 01/31/2020 0701   HCT 21.2 (L) 01/31/2020 0701   PLT 836 (H) 01/31/2020 0701   MCV 84.8 01/31/2020 0701   MCH 26.8 01/31/2020 0701   MCHC 31.6 01/31/2020 0701   RDW 14.1 01/31/2020 0701   LYMPHSABS 0.9 01/31/2020 0701   MONOABS 0.9 01/31/2020 0701   EOSABS 0.1 01/31/2020 0701   BASOSABS 0.0 01/31/2020 0701    Critical Care Time: 40 minutes  Canary Brim, MSN, NP-C [facility] 01/31/2020, 8:05 AM   Please see [email or web address] for pager details.

## 2020-01-31 NOTE — Progress Notes (Signed)
eLink Physician-Brief Progress Note Patient Name: Darlene Castillo DOB: 22-Nov-1960 MRN: [id number]   Date of Service  01/31/2020  HPI/Events of Note  Pt on the ventilator with extreme anxiety and agitation despite Precedex infusion at 1.2 mcg, and Versed infusion at 2 mg / hour, Pt is also on Seroquel via NG tube.  eICU Interventions  Versed infusion changed to titratable 2-5 mg per hour PRN agitation / Anxiety.        Migdalia Dk 01/31/2020, 8:38 PM

## 2020-01-31 NOTE — Progress Notes (Signed)
PHARMACY - TOTAL PARENTERAL NUTRITION CONSULT NOTE   Indication: Prolonged ileus  Patient Measurements: Height: 5' 1"  (154.9 cm) Weight: 72.2 kg (159 lb 2.8 oz) IBW/kg (Calculated) : 47.8   Body mass index is 30.08 kg/m. Usual Weight: 67 kg  Assessment: 59 yo F s/p Sigmoid colon perforation secondary to chronic constipation and ulceration with feculent peritonitis and gross contamination of abdominal cavity with stool balls  -S/p Exploratory laparotomy with partial colectomy and end colostomy 5/5 Dr. Brantley Stage 5/6 PICC placed and started TPN for nutrition support; expect prolonged ileus  Glucose / Insulin: prediabetes, A1c 6.3, CBGs higher with tube feed started 5/13- TF off since 5/16 1800,  range 105-165, MD added 4 units insulin q4hr 5/15 when TF added, TF currently off & scheduled insulin held. 6 U SSI given Electrolytes:  Phos remains 5 (none in TPN) CoCa 10.08 (none in TPN), K 3.8 (goal >=4)  other lytes WNL Renal: Scr WNL,UOP  1750>>800 ml, NGO 900 mls. Stool 400> 0 LFTs / TGs: WNL; Trig 27> 331> 434 > 270 > 210>105, propofol dc'd 5/7, Lipids now in formula SMOF lipid emulsion added 5/12, had been held for first 7 days for critically ill patients per ASPEN guidelines 5/17 AST/ALT sl elevated, ALK phos up to 271 Prealbumin / albumin: prealbumin 9.7 > < 5 > 7.9, reflects inflammation of post op stress and critical illness, admit alb 4.5 WNL, BMI 27.9, well-nourished PTA, poor PO intake for 5 days PTA Intake / Output; MIVF: IVF dc'd Surgeries / Procedures:   -S/p Exploratory laparotomy with partial colectomy and end colostomy 5/5 Dr. Brantley Stage GI Imaging: CT 5/12 -  diffuse inflammatory wall thickening throughout the colon with diffuse fecal material identified, free fluid in the pelvis but no definitive abscess. CT 5/17 Increasingly organized appearance of LEFT lower quadrant and pelvic fluid with slight decrease in the deep pelvic fluid and slight increase in LEFT lower quadrant  fluid. Interloop fluid in the LEFT hemiabdomen is slightly increased overall Persistent small areas of loculated perihepatic fluid unchanged Central access: PICC 5/6 for TPN TPN start date: 01/19/2020  Nutritional Goals (per RD recommendation updated on 01/23/2020): kCal: 1379, Protein: 114-130, Fluid: >= 1.3 L/day  Goal TPN rate is 80 mL/hr (provides 115 g of protein and ~1400 kcals per day) with lipids  Current Nutrition:  TPN at 40 ml/hr Tube feed off since 5/16 at 6 pm - to be resumed today  prostat 30 ml BID   Goal rate Tube feed 55 ml/hr + Pro-Stat delivers 129 gm protein, 1784 kCal  Plan:  20 meq KCL per tube now Continue TPN at 40 ml/hr - 1/2 rate per CCS request Electrolytes: 24mq/L of Na, 351m/L of K, 2.42m70mL of Ca, 0 mEq/L of Mg, 0 mmol/L of Phos . Cl:Ac ratio 1:1 Standard MVI and trace elements daily - change to per tube once able Continue q4h SSI,    Monitor TPN labs on Mon/Thurs  MicEudelia Bunchharm.D 01/31/2020, 7:37 AM  Dedicated ICU phone [phone] only till 2:30pm

## 2020-01-31 NOTE — Progress Notes (Signed)
eLink Physician-Brief Progress Note Patient Name: Darlene Castillo DOB: 01-27-1961 MRN: [id number]   Date of Service  02/02/2020  HPI/Events of Note  Secondary thrombocytosis likely related to Pt's anemia and possible GI bleeding.  eICU Interventions  Will send manual peripheral smear, and reticulocyte count, also iron studies.        Thomasene Lot Raeli Wiens 02-Feb-2020, 5:36 AM

## 2020-01-31 NOTE — Progress Notes (Signed)
Nutrition Follow-up  DOCUMENTATION CODES:   Not applicable  INTERVENTION:  - will order Vital AF 1.2 @ 20 ml/hr to advance by 10 ml every 8 hours to reach goal rate of 60 ml/hr with 30 ml prostat once/day. - at goal rate, this regimen will provide 1828 kcal, 123 grams protein, and 1168 ml free water. - free water flush, if desired, to be per CCM or CCS.    NUTRITION DIAGNOSIS:   Inadequate oral intake related to inability to eat as evidenced by NPO status. 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 CT revealed colonic perforation in sigmoid/rectosigmoid.  Significant Events: 5/5- admission; NGT placed in R nare; ex lap with partial colectomy and end colostomy  5/6- initial RD assessment; triple lumen PICC placed in L brachial; TPN initiation 5/13- TF initiation  5/14- TPN decreased to 40 ml/hr 5/16- TPN decreased to 30 ml/hr 5/17- TF stopped and NGT to LIS overnight d/t bilious emesis   Consult received to restart TF today. Patient discussed in rounds this AM. TPN at 40 ml/hr via PICC with plan to stop TPN tomorrow if patient is tolerating TF. Will re-order TF as outlined above to be per R nare NGT.   Weight beginning to trend back down. Flow sheet documentation indicates mild edema to BUE. Will use admission weight of 67 lb to re-estimate kcal need.    Patient is currently intubated on ventilator support MV: 11.3 L/min Temp (24hrs),  Avg:100.5 F (38.1 C), Min:99.1 F (37.3 C), Max:103.1 F (39.5 C) Propofol: none  Labs reviewed; CBGs: 108, 105, 111, and 121 mg/dl, BUN: 29 mg/dl, Ca: 8.4 mg/dl, Phos: 5 mg/dl. Medications reviewed; 40 mg pepcid per NGT/day, sliding scale novolog, 1 packet miralax BID. Drips; precedex @ 0.8 mcg/kg/hr, versed @ 2 mg/hr.    Diet Order:   Diet Order            Diet NPO time specified  Diet effective midnight              EDUCATION NEEDS:   Not appropriate for education at this time  Skin:  Skin Assessment: Skin Integrity Issues: Skin Integrity Issues:: Incisions, Wound VAC Wound Vac: being emptied MWF Incisions: abdomen (5/5)  Last BM:  5/17  Height:   Ht Readings from Last 1 Encounters:  01/31/20 5\' 1"  (1.549 m)    Weight:   Wt Readings from Last 1 Encounters:  01/30/20 72.2 kg     Estimated Nutritional Needs:  Kcal:  1873 kcal Protein:  114-130 Fluid:  >/= 1.8 L/day     02/01/20, MS, RD, LDN, CNSC Inpatient Clinical Dietitian RD pager # available in AMION  After hours/weekend pager # available in [facility]

## 2020-01-31 NOTE — Progress Notes (Addendum)
ANTICOAGULATION CONSULT NOTE  Pharmacy Consult for Lovenox Indication: DVT , R brachial  Allergies  Allergen Reactions  . Ibuprofen Itching   Patient Measurements: Height: 5\' 1"  (154.9 cm) Weight: 72.2 kg (159 lb 2.8 oz) IBW/kg (Calculated) : 47.8 Heparin Dosing Weight: 67kg  Vital Signs: Temp: 99.6 F (37.6 C) (05/18 0400) Temp Source: Axillary (05/18 0400) BP: 130/49 (05/17 2300) Pulse Rate: 80 (05/18 0320)  Labs: Recent Labs    01/29/20 0252 01/29/20 0252 01/30/20 0222 01/31/20 0408  HGB 7.4*   < > 7.4* 7.2*  HCT 23.6*  --  23.6* 22.7*  PLT 564*  --  522* 910*  CREATININE 0.81  --  0.81 0.62   < > = values in this interval not displayed.   Estimated Creatinine Clearance: 68.9 mL/min (by C-G formula based on SCr of 0.62 mg/dL).  Assessment:  68 yoF s/p Sigmoid colon perforation secondary to chronic constipation and ulceration with feculent peritonitis and gross contamination of abdominal cavity -S/pExploratory laparotomy with partial colectomy and end colostomy5/5 Dr. 46 5/13: begin IV Heparin for R brachial DVT at 1000 units/hr, no bolus per CCM 5/15 heparin >> Lovenox 80 q12  01/31/2020  Hg 7.2 & 6.7 this AM, PLTC up to 910,  Wt down to 72.2 kg  per CCM "Secondary thrombocytosis likely related to Pt's anemia and possible GI bleeding. Will send manual peripheral smear, and reticulocyte count, also iron studies. "   Plan:  Hold [facility] today for trach 5/19 @ 0730 am F/u w/ CCM 5/19 when to resume, probably 5/19 PM dose decrease Lovenox to 70 mg Sheridan q12 (~ 1mg /kg q12) Consider change Lovenox to 1.5mg /kg q24 once out of ICU Monitor for s/s bleed CBC daily  6/19, Pharm.D 01/31/2020 7:33 AM

## 2020-02-01 ENCOUNTER — Inpatient Hospital Stay (HOSPITAL_COMMUNITY): Payer: BLUE CROSS/BLUE SHIELD

## 2020-02-01 LAB — CBC
HCT: 26 % — ABNORMAL LOW (ref 36.0–46.0)
Hemoglobin: 8.3 g/dL — ABNORMAL LOW (ref 12.0–15.0)
MCH: 26.9 pg (ref 26.0–34.0)
MCHC: 31.9 g/dL (ref 30.0–36.0)
MCV: 84.4 fL (ref 80.0–100.0)
Platelets: 858 10*3/uL — ABNORMAL HIGH (ref 150–400)
RBC: 3.08 MIL/uL — ABNORMAL LOW (ref 3.87–5.11)
RDW: 14.7 % (ref 11.5–15.5)
WBC: 12.7 10*3/uL — ABNORMAL HIGH (ref 4.0–10.5)
nRBC: 0 % (ref 0.0–0.2)

## 2020-02-01 LAB — URINALYSIS, ROUTINE W REFLEX MICROSCOPIC
Bilirubin Urine: NEGATIVE
Glucose, UA: NEGATIVE mg/dL
Ketones, ur: NEGATIVE mg/dL
Leukocytes,Ua: NEGATIVE
Nitrite: NEGATIVE
Protein, ur: NEGATIVE mg/dL
Specific Gravity, Urine: 1.012 (ref 1.005–1.030)
pH: 5 (ref 5.0–8.0)

## 2020-02-01 LAB — BASIC METABOLIC PANEL
Anion gap: 10 (ref 5–15)
BUN: 23 mg/dL — ABNORMAL HIGH (ref 6–20)
CO2: 27 mmol/L (ref 22–32)
Calcium: 8.5 mg/dL — ABNORMAL LOW (ref 8.9–10.3)
Chloride: 103 mmol/L (ref 98–111)
Creatinine, Ser: 0.54 mg/dL (ref 0.44–1.00)
GFR calc Af Amer: >60 mL/min (ref >60)
GFR calc non Af Amer: >60 mL/min (ref >60)
Glucose, Bld: 150 mg/dL — ABNORMAL HIGH (ref 70–99)
Potassium: 3.5 mmol/L (ref 3.5–5.1)
Sodium: 140 mmol/L (ref 135–145)

## 2020-02-01 LAB — GLUCOSE, CAPILLARY
Glucose-Capillary: 110 mg/dL — ABNORMAL HIGH (ref 70–99)
Glucose-Capillary: 127 mg/dL — ABNORMAL HIGH (ref 70–99)
Glucose-Capillary: 129 mg/dL — ABNORMAL HIGH (ref 70–99)
Glucose-Capillary: 134 mg/dL — ABNORMAL HIGH (ref 70–99)
Glucose-Capillary: 140 mg/dL — ABNORMAL HIGH (ref 70–99)
Glucose-Capillary: 146 mg/dL — ABNORMAL HIGH (ref 70–99)
Glucose-Capillary: 158 mg/dL — ABNORMAL HIGH (ref 70–99)
Glucose-Capillary: 168 mg/dL — ABNORMAL HIGH (ref 70–99)

## 2020-02-01 LAB — PROTIME-INR
INR: 1.2 (ref 0.8–1.2)
Prothrombin Time: 14.5 seconds (ref 11.4–15.2)

## 2020-02-01 LAB — TYPE AND SCREEN
ABO/RH(D): O POS
Antibody Screen: NEGATIVE
Unit division: 0

## 2020-02-01 LAB — BPAM RBC
Blood Product Expiration Date: 202106152359
ISSUE DATE / TIME: 202105181025
Unit Type and Rh: 5100

## 2020-02-01 LAB — APTT: aPTT: 41 seconds — ABNORMAL HIGH (ref 24–36)

## 2020-02-01 LAB — PHOSPHORUS: Phosphorus: 4 mg/dL (ref 2.5–4.6)

## 2020-02-01 LAB — MAGNESIUM: Magnesium: 2.1 mg/dL (ref 1.7–2.4)

## 2020-02-01 MED ORDER — STERILE WATER FOR INJECTION IJ SOLN
INTRAMUSCULAR | Status: AC
  Filled 2020-02-01: qty 10

## 2020-02-01 MED ORDER — ETOMIDATE 2 MG/ML IV SOLN
INTRAVENOUS | Status: AC
  Filled 2020-02-01: qty 20

## 2020-02-01 MED ORDER — POTASSIUM CHLORIDE 20 MEQ/15ML (10%) PO SOLN
40.0000 meq | Freq: Once | ORAL | Status: AC
  Administered 2020-02-01: 40 meq
  Filled 2020-02-01: qty 30

## 2020-02-01 MED ORDER — VECURONIUM BROMIDE 10 MG IV SOLR
INTRAVENOUS | Status: AC
  Filled 2020-02-01: qty 10

## 2020-02-01 MED ORDER — FUROSEMIDE 10 MG/ML IJ SOLN
40.0000 mg | Freq: Two times a day (BID) | INTRAMUSCULAR | Status: AC
  Administered 2020-02-01 – 2020-02-02 (×2): 40 mg via INTRAVENOUS
  Filled 2020-02-01 (×2): qty 4

## 2020-02-01 MED ORDER — ENOXAPARIN SODIUM 80 MG/0.8ML ~~LOC~~ SOLN
70.0000 mg | Freq: Two times a day (BID) | SUBCUTANEOUS | Status: DC
  Administered 2020-02-01 – 2020-02-02 (×3): 70 mg via SUBCUTANEOUS
  Filled 2020-02-01 (×4): qty 0.7

## 2020-02-01 MED ORDER — CLONIDINE HCL 0.3 MG PO TABS
0.3000 mg | ORAL_TABLET | Freq: Four times a day (QID) | ORAL | Status: DC
  Administered 2020-02-01 – 2020-02-02 (×4): 0.3 mg
  Filled 2020-02-01 (×4): qty 1

## 2020-02-01 MED ORDER — TRAVASOL 10 % IV SOLN
INTRAVENOUS | Status: AC
  Filled 2020-02-01: qty 576

## 2020-02-01 MED ORDER — LIDOCAINE HCL 1 % IJ SOLN
INTRAMUSCULAR | Status: AC
  Filled 2020-02-01: qty 20

## 2020-02-01 MED ORDER — PROPOFOL 500 MG/50ML IV EMUL
INTRAVENOUS | Status: AC
  Filled 2020-02-01: qty 50

## 2020-02-01 MED ORDER — PIPERACILLIN-TAZOBACTAM 3.375 G IVPB
3.3750 g | Freq: Three times a day (TID) | INTRAVENOUS | Status: DC
  Administered 2020-02-01 (×3): 3.375 g via INTRAVENOUS
  Filled 2020-02-01 (×3): qty 50

## 2020-02-01 NOTE — Procedures (Signed)
Bronchoscopy  Indication: trach  Consent: signed in chart  Anesthesia: in place for trach  Procedure - Timeout performed - Bronchoscope advanced through ETT - Therapeutic suctioning of airways and ETT performed - Following this direct visualization of tracheostomy tube placement by Dr. Isaiah Serge into tracheal lumen was provided

## 2020-02-01 NOTE — TOC Progression Note (Addendum)
Transition of Care [facility]) - Progression Note    Patient Details  Name: PHALA SCHRAEDER MRN: [id number] Date of Birth: [birth year]  [facility] CM/SW Contact  Golda Acre, RN Phone Number: 02/01/2020, 9:54 AM  Clinical Narrative:    Pt trached this am,remains on vent with full support and iv sedation,iv abx x2,iv tpn. abd wound vac in place,wbc=12.7, temp 1.3.1. bld cultures pending. QUESTION OF LTACH PLACMENT PLACED ON PHYSICIAN NOTE PAD.  Expected Discharge Plan: Home/Self Care Barriers to Discharge: Continued Medical Work up  Expected Discharge Plan and Services Expected Discharge Plan: Home/Self Care       Living arrangements for the past 2 months: Single Family Home                                       Social Determinants of Health (SDOH) Interventions    Readmission Risk Interventions No flowsheet data found.

## 2020-02-01 NOTE — Consult Note (Signed)
WOC Nurse ostomy follow up Stoma type/location: LLQ, end colostomy  Stomal assessment/size: pink and moist, visualized through pouch Peristomal assessment: NA Treatment options for stomal/peristomal skin: NA Output bloody; SMOG enema administered yesterday  Ostomy pouching: 1pc./2pc.  Education provided: patient sedated on the vent; no family in the room Enrolled patient in [place] [employer] Discharge program: Yes   WOC Nurse wound follow up Wound type: surgical  Measurement:16cm x 6cm x 4cm  Wound IPP:GFQMK 100% early granulation tissue Drainage (amount, consistency, odor) minimal in canister, no odor Periwound:intact; colostomy left lateral aspect of wound Dressing procedure/placement/frequency: Removed old NPWT dressing Cleansed wound with normal saline Filled wound with  1___ piece of black foam,  Sealed NPWT dressing at HG Patient received IV pain medication per bedside nurse prior to dressing change Patient tolerated but is restless on the vent   WOC nurse will continue to provide NPWT dressing changed due to the complexity of the dressing change.    Darlene Castillo [facility], CNS, [employer] [phone]

## 2020-02-01 NOTE — Progress Notes (Signed)
14 Days Post-Op  Subjective: On vent, sedated.  Just had trach placed by CCM. Having multiple BMs in ostomy after SMOG enema yesterday.  Tolerated TFs yesterday but held overnight for trach today.  Fevers up to 103 yesterday  ROS: unable  Objective: Vital signs in last 24 hours: Temp:  [98 F (36.7 C)-103.1 F (39.5 C)] 99.7 F (37.6 C) (05/19 0834) Pulse Rate:  [74-142] 77 (05/19 0600) Resp:  [21-34] 30 (05/19 0834) BP: (94-199)/(38-143) 99/38 (05/19 0800) SpO2:  [91 %-99 %] 92 % (05/19 0819) FiO2 (%):  [40 %] 40 % (05/19 0819) Weight:  [69.4 kg-70.6 kg] 70.6 kg (05/19 0418) Last BM Date: 01/31/20  Intake/Output from previous day: 05/18 0701 - 05/19 0700 In: 2287.7 [I.V.:1487.8; Blood:345; IV Piggyback:455] Out: 2150 [Urine:1050; OEUMP:5361] Intake/Output this shift: No intake/output data recorded.  PE: Gen: sedated on vent Heart: regular Lungs: CTAB, new trach in place Abd: softer today, but still with some distention, colostomy with some liquid tan stool present today, stoma is pink and viable.  Midline wound with VAC present.  Will try to see wound at dressing change  Lab Results:  Recent Labs    01/31/20 0701 01/31/20 0701 01/31/20 1500 02/01/20 0354  WBC 11.6*  --   --  12.7*  HGB 6.7*   < > 7.8* 8.3*  HCT 21.2*   < > 24.3* 26.0*  PLT 836*  --   --  858*   < > = values in this interval not displayed.   BMET Recent Labs    01/31/20 0408 02/01/20 0354  NA 138 140  K 3.8 3.5  CL 100 103  CO2 27 27  GLUCOSE 128* 150*  BUN 29* 23*  CREATININE 0.62 0.54  CALCIUM 8.4* 8.5*   PT/INR Recent Labs    02/01/20 0354  LABPROT 14.5  INR 1.2   CMP     Component Value Date/Time   NA 140 02/01/2020 0354   K 3.5 02/01/2020 0354   CL 103 02/01/2020 0354   CO2 27 02/01/2020 0354   GLUCOSE 150 (H) 02/01/2020 0354   BUN 23 (H) 02/01/2020 0354   CREATININE 0.54 02/01/2020 0354   CALCIUM 8.5 (L) 02/01/2020 0354   PROT 7.0 01/30/2020 0222   ALBUMIN 1.9  (L) 01/30/2020 0222   AST 66 (H) 01/30/2020 0222   ALT 50 (H) 01/30/2020 0222   ALKPHOS 271 (H) 01/30/2020 0222   BILITOT 0.2 (L) 01/30/2020 0222   GFRNONAA >60 02/01/2020 0354   GFRAA >60 02/01/2020 0354   Lipase     Component Value Date/Time   LIPASE 19 01/18/2020 0525       Studies/Results: CT ABDOMEN PELVIS W CONTRAST  Result Date: 01/30/2020 CLINICAL DATA:  Abdominal abscess or infection suspected status post Hartmann's for stercoral poor for a shin. EXAM: CT ABDOMEN AND PELVIS WITH CONTRAST TECHNIQUE: Multidetector CT imaging of the abdomen and pelvis was performed using the standard protocol following bolus administration of intravenous contrast. CONTRAST:  14mL OMNIPAQUE IOHEXOL 300 MG/ML  SOLN COMPARISON:  01/18/2020 and 01/24/2020 FINDINGS: Lower chest: Small effusions and basilar airspace disease worse on the LEFT than the RIGHT with near complete collapse or consolidation of the LEFT lung base. Hepatobiliary: Loculated perihepatic fluid is similar to the prior study. No focal parenchymal abnormality. The gallbladder is under distended and there is no biliary ductal dilation. Portal vein is patent. Pancreas: Pancreas is unremarkable without ductal dilation, inflammation or lesion. Spleen: Spleen is normal size.  No  focal lesion. Adrenals/Urinary Tract: Adrenal glands are normal. Kidneys enhance symmetrically. Large renal cyst on the RIGHT is unchanged. No hydronephrosis. Urinary bladder with mild thickening along the superior margin potentially related to recent surgery of the adjacent colon. Stomach/Bowel: Gastric tube in place, tip in the mid stomach. No extraluminal contrast material is noted on today's scan. The small bowel is only minimally dilated. Dilute contrast begins to fill the colon with partially formed stool throughout the colon. Transverse colon is moderately dilated measuring approximately 5 cm, similar caliber in the axial plain though with less wall thickening than  on the prior exam of 01/24/2020. In coronal there is increase in transverse colonic diameter approximately 6 cm. Heart Rexanne Mano pouch is demonstrated. This terminates in the upper pelvis. Vascular/Lymphatic: Calcified and noncalcified plaque in the abdominal aorta without sign of aneurysm. No adenopathy in the retroperitoneum or in the upper abdomen. No pelvic lymphadenopathy. Reproductive: Uterus remains in situ. No adnexal mass. Fluid contacts the anterior superior margin of the uterus and layers atop the urinary bladder. Other: Fluid in the pelvis shows more peripheral enhancement than on the previous study. Loculated interloop fluid and fluid anterior to bowel loops in the LEFT lower quadrant with more organized appearance and slight increased compared to the previous exam, for instance on image 61 of series 2 an area of measuring approximately 9 x 4 cm previously 7.1 x 3.8 cm. This is just below, inferior to the colostomy site and is tracking between small bowel loops and the lower margin of the colon as a turns towards the ostomy in this location. Fluid in the cul-de-sac in the pelvis measures 6.2 x 2.5 cm as compared to 6.3 x 2.7 cm, this area has diminished slightly in size compared to the previous exam. Small loculated areas along the RIGHT hemi liver as discussed above similar to the prior study. Diffuse mesenteric edema and interloop fluid. Small pockets of interloop fluid and generalized edema may be slightly diminished compared to the prior study. Midline postoperative changes with wound VAC overlying the site. Musculoskeletal: No acute bone finding. No destructive bone process. IMPRESSION: 1. Increasingly organized appearance of LEFT lower quadrant and pelvic fluid with slight decrease in the deep pelvic fluid and slight increase in LEFT lower quadrant fluid. Interloop fluid in the LEFT hemiabdomen is slightly increased overall. 2. Diffuse interloop fluid and mesenteric stranding is slightly diminished.  3. Persistent small areas of loculated perihepatic fluid unchanged, largest area in the axial plane measuring approximately 2.5 by 1.7 cm. Decreased formed stool in the colon but with slight increase in colonic distension particularly of the transverse colon when compared to the prior study. This may reflect colonic ileus, attention on follow-up 4. Persistent basilar consolidation and pleural effusions without significant change. Electronically Signed   By: Donzetta Kohut M.D.   On: 01/30/2020 14:58   DG CHEST PORT 1 VIEW  Result Date: 01/31/2020 CLINICAL DATA:  Acute respiratory failure with hypoxia EXAM: PORTABLE CHEST 1 VIEW COMPARISON:  Yesterday FINDINGS: Endotracheal tube tip is just below the clavicular heads. The enteric tube at least reaches the stomach. Left PICC with tip near the upper cavoatrial junction, partially overlapped by EKG leads. Unchanged hazy and streaky pulmonary opacity including left pleural effusion. No visible pneumothorax. Normal heart size. IMPRESSION: Stable hardware positioning and pleural effusion with pulmonary opacity. Atelectasis was seen at the lung bases on abdominal CT yesterday. Electronically Signed   By: Marnee Spring M.D.   On: 01/31/2020 07:28   DG  Abd Portable 1V  Result Date: 01/30/2020 CLINICAL DATA:  Check gastric catheter placement EXAM: PORTABLE ABDOMEN - 1 VIEW COMPARISON:  01/29/2020 FINDINGS: Gastric catheter is again noted coiled within the stomach. The overall appearance is stable when compare with the prior exam. Scattered large and small bowel gas is noted. No free air is seen. IMPRESSION: Stable gastric catheter as described. Electronically Signed   By: Alcide Clever M.D.   On: 01/30/2020 12:21    Anti-infectives: Anti-infectives (From admission, onward)   Start     Dose/Rate Route Frequency Ordered Stop   02/01/20 1200  anidulafungin (ERAXIS) 100 mg in sodium chloride 0.9 % 100 mL IVPB     100 mg 78 mL/hr over 100 Minutes Intravenous Every  24 hours 01/31/20 1034     02/01/20 0800  piperacillin-tazobactam (ZOSYN) IVPB 3.375 g     3.375 g 12.5 mL/hr over 240 Minutes Intravenous Every 8 hours 02/01/20 0735     01/31/20 1200  anidulafungin (ERAXIS) 200 mg in sodium chloride 0.9 % 200 mL IVPB     200 mg 78 mL/hr over 200 Minutes Intravenous  Once 01/31/20 1034 01/31/20 1730   01/27/20 1200  cefTRIAXone (ROCEPHIN) 2 g in sodium chloride 0.9 % 100 mL IVPB  Status:  Discontinued     2 g 200 mL/hr over 30 Minutes Intravenous Every 24 hours 01/27/20 1041 02/01/20 0735   01/26/20 1000  anidulafungin (ERAXIS) 100 mg in sodium chloride 0.9 % 100 mL IVPB  Status:  Discontinued     100 mg 78 mL/hr over 100 Minutes Intravenous Every 24 hours 01/25/20 0956 01/27/20 1041   01/25/20 1100  anidulafungin (ERAXIS) 200 mg in sodium chloride 0.9 % 200 mL IVPB     200 mg 78 mL/hr over 200 Minutes Intravenous  Once 01/25/20 1006 01/25/20 1525   01/18/20 1400  piperacillin-tazobactam (ZOSYN) IVPB 3.375 g  Status:  Discontinued     3.375 g 12.5 mL/hr over 240 Minutes Intravenous Every 8 hours 01/18/20 1311 01/27/20 1041   01/18/20 0715  piperacillin-tazobactam (ZOSYN) IVPB 3.375 g     3.375 g 100 mL/hr over 30 Minutes Intravenous  Once 01/18/20 0707 01/18/20 0837       Assessment/Plan HTN Prediabetic- A1c6.3,SSI Tobacco abuse 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 Trach this am by CCM.  Greatly appreciate all their assistance! Fever - up to 103 yesterday.  Will broaden back out abx therapy given fluid in pelvis on CT scan.  It did not appear to be totally an abscess, but will have IR consider drainage given persistent fevers despite treatment for PNA.  Repeat blood cxs negative yesterday.  Will check UA as it hasn't been checked in a week. Anemia - tx 1 unit of pRBCs  5/18.  hgb 8.5 this am, follow R brachial DVT - lovenox 70mg  BID, CCM to hold prior to trach in am  Septic shock Sigmoid colon perforation secondary to chronic constipation and ulceration with feculent peritonitis and gross contamination of abdominal cavity with stool balls -S/pExploratory laparotomy with partial colectomy and end colostomy5/5 Dr. - POD#14 - surgical path:Perforation with acute inflammation and acute serositis, no evidence of malignancy -wound vac MWF - WOC RNfollowingfor new colostomyand vac -WBC 12.7 today -resume TFs today -SMOG enema helped significantly with constipation. -CT A/P from Monday with some pelvic fluid with mild rim enhancement.  No totally convincing this is abscess.  Given persistent fevers despite tx for PNA, will broaden abx coverage back out to cover abd as well and ask IR to assess for perc drain to rule out this as source of fevers.  ID -zosyn 5/5>>5/14, 5/19 --> eraxis 5/12>>5/14, 5/19 --> Rocephin 5/14 -->5/19 FEN -NGT/TFs, TNA 1/2 rate for today and once TFs increased today will plan to DC tomorrow VTE -SCDs, lovenox70mg  BID Foley -d/c 5/13   LOS: 14 days    Letha Cape , [facility] 02/01/2020, 8:34 AM Please see Amion for pager number during day hours 7:00am-4:30pm or 7:00am -11:30am on weekends

## 2020-02-01 NOTE — Progress Notes (Signed)
During trach insertion, this RN, RT, MD Isaiah Serge, MD Katrinka Blazing at bedside. Verbal order to give patient a total of the following: 20 mg Etomidate, 100 mg Propofol bolus, 12 mg Versed, 6 mg Dilaudid, 10 mg Vecuronium during procedure. Propofol infusion started at 20 mcg/kg/min and titrated up to 30 mcg/kg/min and then discontinued after procedure.

## 2020-02-01 NOTE — Procedures (Signed)
Procedure: Percutaneous Tracheostomy CPT 31600 Performed by: Dr. Chilton Greathouse, Dr Myrla Halsted Bronchoscopy Assistant: Dr. Myrla Halsted.  Indications: Chronic respiratory failure and need for ongoing mechanical ventilation.  Consent: Signed in chart  Preprocedure: Universal protocol was followed for this procedure. Timeout performed. Anterior neck prepped and draped.  Anesthesia: The patient was intubated and sedated prior to the procedure. Additional midazolam, etomidate, fentanyl and vecuronium were given for sedation and paralysis with close attention to vital signs throughout procedure.   Procedure: The patient was placed in the supine position. The anterior neck was prepped and draped in usual sterile fashion. 1% lidocaine was administered approximately 2 fingerbreadths above the sternal notch for local anesthesia. A 1.5-cm vertical incision was then performed 2 fingerbreadths above the sternal notch. Using a curved Kelly, blunt dissection was performed down to the level of the pretracheal fascia. At this point, the bronchoscope was introduced through the endotracheal tube and the trachea was properly visualized. The endotracheal tube was then gradually withdrawn within the trachea under direct bronchoscopic visualization. Proper midline position was confirmed by bouncing the needle from the tracheostomy tray over the trachea with bronchoscopic examination. The needle was advanced into the trachea and proper positioning was confirmed with direct visualization. The needle was then removed leaving a white outer cannula in position. The wire from the tracheostomy tray was then advanced through the white outer cannula. The cannula was then removed. The small, blue dilator was then advanced over the wire into the trachea. Once proper dilatation was achieved, the dilator was removed. The large, tapered dilator was then advanced over the wire into the trachea. The dilator was removed leaving the wire and white  inner cannula in position. A number #6 cuffed percutaneous Shiley tracheostomy tube was then advanced over the wire and white inner cannula into the trachea. Proper positioning was confirmed with bronchoscopic visualization. The tracheostomy tube was then sutured in place with four nylon sutures. It was further secured with a tracheostomy tie.  Estimated blood loss: Less than 5 mL.  Complications: None immediate.  CXR ordered.   Chilton Greathouse MD [facility] Please see [email or web address] for pager details.  02/01/2020, 8:08 AM

## 2020-02-01 NOTE — Progress Notes (Signed)
Patient ID: Darlene Castillo, female   DOB: 09-May-1961, 60 y.o.   MRN: [id number] Request received for possible paracentesis on pt. On limited US abd in all four quadrants there is only a small amount of fluid located in midline between wound vac and ostomy but not safely accessible for aspiration. Fluid appears simple by [place].  Images were reviewed by Dr. Grace Isaac. Procedure cancelled. Nurse updated.

## 2020-02-01 NOTE — Progress Notes (Addendum)
NAME:  VEONA BITTMAN, MRN:  [id number], DOB:  Aug 10, 1961, LOS: 70 ADMISSION DATE:  01/18/2020, CONSULTATION DATE:  01/18/20 REFERRING MD:  Erroll Luna CHIEF COMPLAINT:  VDRF, septic shock  Brief History   59 y/o F admitted 5/5 with abdominal pain, constipation and one episode of vomiting. Found to have a perforated sigmoid colon with peritonitis.  To OR for exploratory laparotomy with partial colectomy and end colostomy (negative pathology), returned to ICU post-operatively on mechanical ventilation.   Past Medical History  Chronic opiate use Tobacco abuse DM HTN  Significant Hospital Events   5/05 Admit, ex lap with partial colectomy and end colostomy for perf:  neg path 5/11 Extubated 5/12 Failed extubation.  Initially DNI but reversed.   5/13  Back on vent. Fever curve better. Concern for A Fib but EKG sinus tach with PAC. Down to 40% fio2 . TF on hold but getting meds via NG. On TPN. On dilaudid gtt, scheduled methadone, versed gtt, clonidine, seroquel and nicotine patch. Started on IV heparin for RUE DVT  5/14 dc versed gtt. Working on weaning sedation. Changing abx to rocephin 5/15 Methadone held at family request  5/16 Restarted methadone due to significant agitation 5/18 Agitation overnight, sedation gtt's increased 5/19 Trach   Consults:  CCS PCCM  Procedures:  R IJ CVL 5/5 >> 5/6 ALine 5/5 >> 5/7  ETT 5/5 >> 5/11, replaced 5/12 >> 5/19  Trach 5/19 >> LUE PICC 5/6 >>  Significant Diagnostic Tests:   CT abdomen pelvis with contrast 01/17/2020 >> scattered groundglass opacities in the lower lungs bilaterally, no focal opacities.  Abdominal free air.  Multiple dilated loops of bowel with stool.  Large R renal cyst.  Path 5/5 >> Perforation with acute inflammation and acute serositis. No evidence of malignancy.    [place] UE 5/13 >> RUE DVT  CT ABD / Pelvis w Contrast 5/17 >> increasingly organized apearance of LLQ & pelvic fluid with slight decrease in the deep pelvic  fluid.  Slight increase in LLQ fluid. Interloop fluid in the left hemiabdomen is slightly increased overall.  Diffuse interloop fluid and mesenteric stranding is slightly diminished. Persistent small areas of loculated perihepatic fluid unchanged, largest area in the axial plane measureing 2.5 x 1.7 cm. Decreased formed stool in the colon but with slight increase in colonic distention particularly of the transverse colon.  May reflect colonic ileus.  Persistent bibasilar consolidation and pleural effusions without significant change.   Micro Data:  Covid 5/5 >> negative Flu PCR 5/5 >> negative MRSA PCR 5/12 >> neg Tracheal aspirate 5/12 >> klebsiella > R ampicillin, Unasyn, I: zosyn Urine analysis 5/12 >> negative  Blood culture 5/12 >> negative  BCx2 5/18 >>  Antimicrobials:  Zosyn 5/5 >> 5/14 Eraxis 5/12 >> 5/14 Rocephin 5/14 >> Eraxis 5/18 >>  Zosyn 5/19 >>  Interim history/subjective:  Tmax 98 / WBC 12.7 I/O  1L UOP, 1.1L stool, +150ml in 24 hours Pt received intra-stomal enema per WOC RN with 1.1L UOP Glucose range 129-155 Agitation overnight, versed ceiling increased per ELink On vent 40%, PEEP 5  Objective   Blood pressure (!) 153/78, pulse 77, temperature 98 F (36.7 C), temperature source Axillary, resp. rate (!) 31, height 5\' 1"  (1.549 m), weight 70.6 kg, last menstrual period 05/23/2013, SpO2 96 %.    Vent Mode: PRVC FiO2 (%):  [40 %] 40 % Set Rate:  [26 bmp] 26 bmp Vt Set:  [330 mL] 330 mL PEEP:  [5 cmH20] 5 cmH20  Plateau Pressure:  [20 cmH20-23 cmH20] 20 cmH20   Intake/Output Summary (Last 24 hours) at 02/01/2020 0800 Last data filed at 02/01/2020 7989 Gross per 24 hour  Intake 2287.7 ml  Output 2150 ml  Net 137.7 ml   Filed Weights   01/30/20 0418 01/31/20 1839 02/01/20 0418  Weight: 72.2 kg 69.4 kg 70.6 kg   Exam: General: ill appearing adult female lying in bed in NAD HEENT: MM pink/moist, trach midline c/d/i, anicteric Neuro: sedate but arouses to  voice, moves spontaneously  CV: s1s2 RRR, tachy, no m/r/g PULM:  Non-labored on vent, lungs bilaterally clear  GI: less distended, midline abd VAC in place, LLQ ostomy Extremities: warm/dry, no edema  Skin: no rashes or lesions  Resolved problems  Circulatory/septic shock  Assessment & Plan:  LOS 14 days   Acute hypoxic respiratory failure requiring mechanical ventilation, complicated by Klebsiella PNA.  -changed to [facility] 8cc/kg, rate 16   -wean PEEP / FiO2 for sats >90% -daily SBT / WUA -D6/7 rocephin  -VAP prevention measures  -PAD protocol -trach care per protocol, plan to clip trach sutures ~4/28  Acute Metabolic Encephalopathy, started 5/12 Chronic Pain, on oral methadone PTA Difficulties with intermittent agitation, family requests for patient to come off home medications, agitated delirium.  Improved 5/17.   -PAD protocol with versed, precedex  -stop dilaudid gtt per surgical request  -wean sedation as able, hopeful to be able to come off sedation quickly now that she has trach  -continue clonidine, seroquel   Peritonitis 2/2 Sigmoid Colon Perforation from Chronic Constipation s/p ex lap with sigmoid resection and colostomy. Completed 10 days abx for initial peritonitis. Required enema for constipation 5/19. -post-op care per CCS  -monitor for evidence of infection  -follow stoma output  -pain control per CCS  Right Brachial DVT -resume lovenox 5/19   Moderate Malnutrition Expected prolonged ileus post- op, which is improving. -TF / TPN per CCS / Nutrition   Intermittent Fluid and Electrolyte imbalance-net positive volume status over the last few days -follow electrolytes, replace as indicated   Hyperglycemia -SSI   Anemia  Suspect multifactorial in setting of critical illness, recurrent phlebotomy. No acute evidence of bleeding  -trend CBC  -transfuse for Hgb <7% or active bleeding   Intermittent Fevers -continue eraxis, zosyn -rocephin as above for  klebsiella -follow up repeat blood cultures  -CCS planning for IR drain placement into fluid collection -defer further abdominal imaging to CCS, note LLQ fluid on last CT scan  -no foley   Best practice:  Diet: NPO; TPN & TF Pain/Anxiety/Delirium protocol (if indicated): see above VAP protocol (if indicated): In place DVT prophylaxis: SCDs and lovenox GI prophylaxis: pepcid Glucose control: SSI Mobility: bedrest Code Status: full code Family Communication: Mother updated via phone 5/19 on plan of care. Disposition: ICU   BMP Latest Ref Rng & Units 02/01/2020 01/31/2020 01/30/2020  Glucose 70 - 99 mg/dL 211(H) 417(E) 081(K)  BUN 6 - 20 mg/dL 48(J) 85(U) 31(S)  Creatinine 0.44 - 1.00 mg/dL 9.70 2.63 7.85  Sodium 135 - 145 mmol/L 140 138 143  Potassium 3.5 - 5.1 mmol/L 3.5 3.8 4.1  Chloride 98 - 111 mmol/L 103 100 103  CO2 22 - 32 mmol/L 27 27 28   Calcium 8.9 - 10.3 mg/dL ) 8.8(F) 9.0    CBC    Component Value Date/Time   WBC 12.7 (H) 02/01/2020 0354   RBC 3.08 (L) 02/01/2020 0354   HGB 8.3 (L) 02/01/2020 0354   HCT 26.0 (  L) 02/01/2020 0354   PLT 858 (H) 02/01/2020 0354   MCV 84.4 02/01/2020 0354   MCH 26.9 02/01/2020 0354   MCHC 31.9 02/01/2020 0354   RDW 14.7 02/01/2020 0354   LYMPHSABS 0.9 01/31/2020 0701   MONOABS 0.9 01/31/2020 0701   EOSABS 0.1 01/31/2020 0701   BASOSABS 0.0 01/31/2020 0701    Critical Care Time: 35 minutes  Canary Brim, MSN, NP-C [facility] 02/01/2020, 8:00 AM   Please see [email or web address] for pager details.

## 2020-02-01 NOTE — Progress Notes (Addendum)
PHARMACY - TOTAL PARENTERAL NUTRITION CONSULT NOTE   Indication: Prolonged ileus  Patient Measurements: Height: 5' 1"  (154.9 cm) Weight: 70.6 kg (155 lb 10.3 oz) IBW/kg (Calculated) : 47.8   Body mass index is 29.41 kg/m. Usual Weight: 67 kg  Assessment: 59 yo F s/p Sigmoid colon perforation secondary to chronic constipation and ulceration with feculent peritonitis and gross contamination of abdominal cavity with stool balls  -S/p Exploratory laparotomy with partial colectomy and end colostomy 5/5 Dr. Brantley Stage 5/6 PICC placed and started TPN for nutrition support; expect prolonged ileus  Glucose / Insulin: prediabetes, A1c 6.3, CBGs at goal - MD added 4 units insulin q4hr 5/15 when TF added, TF currently off & scheduled insulin held. 14 units SSI given last 24hr Electrolytes:  Phos down to 4 (none in TPN) CoCa 10.2 (none in TPN), K 3.5 (goal >=4)  other lytes WNL Renal: Scr WNL, UOP 1750>>800 ml, NGO 900 mls. Stool 400> 0 LFTs / TGs: WNL; Trig 27> 331> 434 > 270 > 210 > 205, propofol dc'd 5/7, Lipids now in formula SMOF lipid emulsion added 5/12, had been held for first 7 days for critically ill patients per ASPEN guidelines 5/17 AST/ALT sl elevated, ALK phos up to 271 Prealbumin / albumin: prealbumin 9.7 > < 5 > 7.9, reflects inflammation of post op stress and critical illness, admit alb 4.5 WNL, BMI 27.9, well-nourished PTA, poor PO intake for 5 days PTA Intake / Output; MIVF: IVF dc'd, I/O net +137 mL - UOP 1050 mL, stool 1.1L (note on lactulose. cumulative +2L over admission Surgeries / Procedures:   -S/p Exploratory laparotomy with partial colectomy and end colostomy 5/5 Dr. Brantley Stage GI Imaging: CT 5/12 -  diffuse inflammatory wall thickening throughout the colon with diffuse fecal material identified, free fluid in the pelvis but no definitive abscess. CT 5/17 Increasingly organized appearance of LEFT lower quadrant and pelvic fluid with slight decrease in the deep pelvic fluid  and slight increase in LEFT lower quadrant fluid. Interloop fluid in the LEFT hemiabdomen is slightly increased overall Persistent small areas of loculated perihepatic fluid unchanged  Central access: PICC 5/6 for TPN TPN start date: 01/19/2020  Nutritional Goals (per RD recommendation updated on 01/23/2020): kCal: 1379, Protein: 114-130, Fluid: >= 1.3 L/day  Goal TPN rate is 80 mL/hr (provides 115 g of protein and ~1400 kcals per day) with lipids  Current Nutrition:  TPN at 40 ml/hr for hopefully one more day per CCS then able to d/c 5/20 if tolerates TFs Tube feed off since 5/18 PM for trach procedure today - to be resumed today per CCS note prostat 30 ml daily Goal rate Tube feed 60 ml/hr + Pro-Stat delivers 123 g protein, 1828 kCal  Plan:  40 meq KCL per tube now  Continue TPN at 40 ml/hr - 1/2 rate per CCS request Electrolytes: 24mq/L of Na, 367m/L of K, 2.24m27mL of Ca, 0 mEq/L of Mg, 0 mmol/L of Phos . Cl:Ac ratio 1:1 Standard MVI and trace elements daily - change to per tube once able Continue q4h SSI esp since getting novolog 4 units q4h scheduled Monitor TPN labs on Mon/Thurs  JusAdrian SaranharmD, BCPS 02/01/2020 9:40 AM Dedicated ICU phone [phone] only till 2:30pm

## 2020-02-01 NOTE — Progress Notes (Signed)
Assisted with trach. Pt pre oxygenated with 100 % O2 on vent. Pt o2 saturations remained 100% throughout the procedure. 6 Shiley cuffed trach placed and confirmed visually with bronhocope. Airway secured. Ventilator placed to trach after placement. No complications.

## 2020-02-02 ENCOUNTER — Inpatient Hospital Stay (HOSPITAL_COMMUNITY): Payer: BLUE CROSS/BLUE SHIELD

## 2020-02-02 LAB — GLUCOSE, CAPILLARY
Glucose-Capillary: 157 mg/dL — ABNORMAL HIGH (ref 70–99)
Glucose-Capillary: 112 mg/dL — ABNORMAL HIGH (ref 70–99)
Glucose-Capillary: 183 mg/dL — ABNORMAL HIGH (ref 70–99)
Glucose-Capillary: 141 mg/dL — ABNORMAL HIGH (ref 70–99)
Glucose-Capillary: 90 mg/dL (ref 70–99)
Glucose-Capillary: 162 mg/dL — ABNORMAL HIGH (ref 70–99)

## 2020-02-02 LAB — COMPREHENSIVE METABOLIC PANEL
ALT: 53 U/L — ABNORMAL HIGH (ref 0–44)
AST: 63 U/L — ABNORMAL HIGH (ref 15–41)
Albumin: 1.8 g/dL — ABNORMAL LOW (ref 3.5–5.0)
Alkaline Phosphatase: 294 U/L — ABNORMAL HIGH (ref 38–126)
Anion gap: 11 (ref 5–15)
BUN: 21 mg/dL — ABNORMAL HIGH (ref 6–20)
CO2: 25 mmol/L (ref 22–32)
Calcium: 7.9 mg/dL — ABNORMAL LOW (ref 8.9–10.3)
Chloride: 96 mmol/L — ABNORMAL LOW (ref 98–111)
Creatinine, Ser: 0.6 mg/dL (ref 0.44–1.00)
GFR calc Af Amer: >60 mL/min (ref >60)
GFR calc non Af Amer: >60 mL/min (ref >60)
Glucose, Bld: 526 mg/dL (ref 70–99)
Potassium: 4.9 mmol/L (ref 3.5–5.1)
Sodium: 132 mmol/L — ABNORMAL LOW (ref 135–145)
Total Bilirubin: 0.3 mg/dL (ref 0.3–1.2)
Total Protein: 6.6 g/dL (ref 6.5–8.1)

## 2020-02-02 LAB — CBC
HCT: 31.1 % — ABNORMAL LOW (ref 36.0–46.0)
Hemoglobin: 9.8 g/dL — ABNORMAL LOW (ref 12.0–15.0)
MCH: 26.6 pg (ref 26.0–34.0)
MCHC: 31.5 g/dL (ref 30.0–36.0)
MCV: 84.5 fL (ref 80.0–100.0)
Platelets: 870 10*3/uL — ABNORMAL HIGH (ref 150–400)
RBC: 3.68 MIL/uL — ABNORMAL LOW (ref 3.87–5.11)
RDW: 14.7 % (ref 11.5–15.5)
WBC: 11.4 10*3/uL — ABNORMAL HIGH (ref 4.0–10.5)
nRBC: 0.3 % — ABNORMAL HIGH (ref 0.0–0.2)

## 2020-02-02 LAB — URINE CULTURE: Culture: NO GROWTH

## 2020-02-02 LAB — MAGNESIUM: Magnesium: 1.9 mg/dL (ref 1.7–2.4)

## 2020-02-02 LAB — PROCALCITONIN: Procalcitonin: 1.07 ng/mL

## 2020-02-02 LAB — PHOSPHORUS: Phosphorus: 4 mg/dL (ref 2.5–4.6)

## 2020-02-02 MED ORDER — DEXTROSE-NACL 5-0.9 % IV SOLN: INTRAVENOUS | Status: DC

## 2020-02-02 MED ORDER — CLONAZEPAM 0.5 MG PO TBDP
0.5000 mg | ORAL_TABLET | Freq: Two times a day (BID) | ORAL | Status: AC
  Administered 2020-02-04 – 2020-02-05 (×4): 0.5 mg
  Filled 2020-02-02 (×4): qty 1

## 2020-02-02 MED ORDER — CLONAZEPAM 1 MG PO TABS
1.0000 mg | ORAL_TABLET | Freq: Two times a day (BID) | ORAL | Status: AC
  Administered 2020-02-02 – 2020-02-03 (×3): 1 mg
  Filled 2020-02-02 (×3): qty 1

## 2020-02-02 MED ORDER — SODIUM CHLORIDE 0.9 % IV SOLN
2.0000 g | Freq: Three times a day (TID) | INTRAVENOUS | Status: DC
  Administered 2020-02-02 – 2020-02-04 (×7): 2 g via INTRAVENOUS
  Filled 2020-02-02 (×7): qty 2

## 2020-02-02 MED ORDER — CLONIDINE HCL 0.1 MG PO TABS
0.1000 mg | ORAL_TABLET | Freq: Four times a day (QID) | ORAL | Status: DC
  Administered 2020-02-02 – 2020-02-11 (×31): 0.1 mg
  Filled 2020-02-02 (×31): qty 1

## 2020-02-02 MED ORDER — SODIUM CHLORIDE (PF) 0.9 % IJ SOLN
INTRAMUSCULAR | Status: AC
  Filled 2020-02-02: qty 50

## 2020-02-02 MED ORDER — NALOXEGOL OXALATE 12.5 MG PO TABS
12.5000 mg | ORAL_TABLET | Freq: Every day | ORAL | Status: DC
  Filled 2020-02-02: qty 1

## 2020-02-02 MED ORDER — NALOXEGOL OXALATE 12.5 MG PO TABS
12.5000 mg | ORAL_TABLET | Freq: Every day | ORAL | Status: DC
  Administered 2020-02-02 – 2020-02-10 (×8): 12.5 mg via NASOGASTRIC
  Filled 2020-02-02 (×9): qty 1

## 2020-02-02 MED ORDER — CLONAZEPAM 0.125 MG PO TBDP
0.2500 mg | ORAL_TABLET | Freq: Two times a day (BID) | ORAL | Status: AC
  Administered 2020-02-06 – 2020-02-09 (×8): 0.25 mg via ORAL
  Filled 2020-02-02: qty 2
  Filled 2020-02-02: qty 1
  Filled 2020-02-02 (×7): qty 2

## 2020-02-02 MED ORDER — MIDAZOLAM 50MG/50ML (1MG/ML) PREMIX INFUSION: 0.5000 mg/h | INTRAVENOUS | Status: AC

## 2020-02-02 MED ORDER — IOHEXOL 300 MG/ML  SOLN
75.0000 mL | Freq: Once | INTRAMUSCULAR | Status: AC | PRN
  Administered 2020-02-02: 75 mL via INTRAVENOUS

## 2020-02-02 NOTE — Progress Notes (Signed)
PHARMACY - TOTAL PARENTERAL NUTRITION CONSULT NOTE   Indication: Prolonged ileus  Patient Measurements: Height: 5' 1"  (154.9 cm) Weight: 70.6 kg (155 lb 10.3 oz) IBW/kg (Calculated) : 47.8   Body mass index is 29.41 kg/m. Usual Weight: 67 kg  Assessment: 59 yo F s/p Sigmoid colon perforation secondary to chronic constipation and ulceration with feculent peritonitis and gross contamination of abdominal cavity with stool balls  -S/p Exploratory laparotomy with partial colectomy and end colostomy 5/5 Dr. Brantley Stage 5/6 PICC placed and started TPN for nutrition support; expect prolonged ileus  Glucose / Insulin: prediabetes, A1c 6.3, CBGs at goal on ICU SSI - 20 units SSI given last 24hr Electrolytes:  WNL except Na slightly low Renal: Scr WNL, UOP 1750>>800 ml, NGO 900 mls. Stool 400> 0 LFTs / TGs: WNL; Trig 27> 331> 434 > 270 > 210 > 205, propofol dc'd 5/7, Lipids now in formula SMOF lipid emulsion added 5/12, had been held for first 7 days for critically ill patients per ASPEN guidelines 5/17 AST/ALT sl elevated, ALK phos up to 271 Prealbumin / albumin: prealbumin 9.7 > < 5 > 7.9, reflects inflammation of post op stress and critical illness, admit alb 4.5 WNL, BMI 27.9, well-nourished PTA, poor PO intake for 5 days PTA Intake / Output; MIVF: IVF dc'd, I/O net +137 mL - UOP 1050 mL, stool 1.1L (note on lactulose. cumulative +2L over admission Surgeries / Procedures:   -S/p Exploratory laparotomy with partial colectomy and end colostomy 5/5 Dr. Brantley Stage GI Imaging: CT 5/12 -  diffuse inflammatory wall thickening throughout the colon with diffuse fecal material identified, free fluid in the pelvis but no definitive abscess. CT 5/17 Increasingly organized appearance of LEFT lower quadrant and pelvic fluid with slight decrease in the deep pelvic fluid and slight increase in LEFT lower quadrant fluid. Interloop fluid in the LEFT hemiabdomen is slightly increased overall Persistent small areas  of loculated perihepatic fluid unchanged  Central access: PICC 5/6 for TPN TPN start date: 01/19/2020  Nutritional Goals (per RD recommendation updated on 01/23/2020): kCal: 1379, Protein: 114-130, Fluid: >= 1.3 L/day  Goal TPN rate is 80 mL/hr (provides 115 g of protein and ~1400 kcals per day) with lipids  Current Nutrition:  TPN at 40 ml/hr TF at 40 ml/hr (goal rate 60 ml/hr) prostat 30 ml daily  Tube feed 60 ml/hr + Pro-Stat delivers 123 g protein, 1828 kCal  Plan:  Stop TPN tonight No further electrolyte supplementation needed  Reuel Boom, PharmD, BCPS [phone] 02/02/2020, 10:08 AM

## 2020-02-02 NOTE — Progress Notes (Signed)
15 Days Post-Op  Subjective: On vent, eyes open and grimaces to pain.  Still having fevers up to 102.5.  ROS: unable  Objective: Vital signs in last 24 hours: Temp:  [98.9 F (37.2 C)-102.5 F (39.2 C)] 101.2 F (38.4 C) (05/20 0800) Pulse Rate:  [87-144] 111 (05/20 0600) Resp:  [22-41] 37 (05/20 0600) BP: (105-189)/(52-143) 118/68 (05/20 0622) SpO2:  [95 %-100 %] 98 % (05/20 0744) FiO2 (%):  [30 %-40 %] 30 % (05/20 0744) Last BM Date: 02/01/20  Intake/Output from previous day: 05/19 0701 - 05/20 0700 In: 2430.5 [I.V.:1308.7; NG/GT:843.2; IV Piggyback:278.6] Out: 3825 [Urine:2600; Drains:100; SWFUX:3235] Intake/Output this shift: No intake/output data recorded.  PE: Heart: tachy currently as versed weaning Lungs: trach in place, CTAB Abd: softer, still bloated, +BS, tolerating TFs, colostomy putting out liquid stool, over 1L yesterday, midline wound with VAC.  Wound was 100% pink with granulation tissue yesterday at [facility] change, which I was present for.  Lab Results:  Recent Labs    02/01/20 0354 02/02/20 0403  WBC 12.7* 11.4*  HGB 8.3* 9.8*  HCT 26.0* 31.1*  PLT 858* 870*   BMET Recent Labs    02/01/20 0354 02/02/20 0403  NA 140 132*  K 3.5 4.9  CL 103 96*  CO2 27 25  GLUCOSE 150* 526*  BUN 23* 21*  CREATININE 0.54 0.60  CALCIUM 8.5* 7.9*   PT/INR Recent Labs    02/01/20 0354  LABPROT 14.5  INR 1.2   CMP     Component Value Date/Time   NA 132 (L) 02/02/2020 0403   K 4.9 02/02/2020 0403   CL 96 (L) 02/02/2020 0403   CO2 25 02/02/2020 0403   GLUCOSE 526 (HH) 02/02/2020 0403   BUN 21 (H) 02/02/2020 0403   CREATININE 0.60 02/02/2020 0403   CALCIUM 7.9 (L) 02/02/2020 0403   PROT 6.6 02/02/2020 0403   ALBUMIN 1.8 (L) 02/02/2020 0403   AST 63 (H) 02/02/2020 0403   ALT 53 (H) 02/02/2020 0403   ALKPHOS 294 (H) 02/02/2020 0403   BILITOT 0.3 02/02/2020 0403   GFRNONAA >60 02/02/2020 0403   GFRAA >60 02/02/2020 0403   Lipase     Component  Value Date/Time   LIPASE 19 01/18/2020 0525       Studies/Results: US Abdomen Limited  Result Date: 02/01/2020 CLINICAL DATA:  Indeterminate intra-abdominal fluid. Please perform ascites search ultrasound ultrasound-guided paracentesis for diagnostic purposes as indicated. EXAM: LIMITED ABDOMEN ULTRASOUND FOR ASCITES TECHNIQUE: Limited ultrasound survey for ascites was performed in all four abdominal quadrants. COMPARISON:  CT abdomen pelvis-01/30/2020 FINDINGS: Sonographic evaluation of the abdomen demonstrates a trace amount of simple appearing anechoic fluid within the left lower abdomen/pelvis interposed between the midline abdominal wound and the left lower abdominal end colostomy. Given the small amount of fluid as well as poor sonographic window, ultrasound-guided paracentesis was not attempted. IMPRESSION: Trace amount of simple appearing anechoic/simple appearing fluid within the left lower abdomen/pelvis compatible with findings seen on preceding abdominal CT. Given small amount of fluid as well as poor sonographic window given adjacent midline wound and end colostomy, ultrasound-guided paracentesis not attempted. Electronically Signed   By: Sandi Mariscal M.D.   On: 02/01/2020 12:43   DG Chest Port 1 View  Result Date: 02/02/2020 CLINICAL DATA:  Respiratory failure EXAM: PORTABLE CHEST 1 VIEW COMPARISON:  Yesterday FINDINGS: Haziness of the bilateral chest, usually pleural fluid and atelectasis. Tracheostomy tube, left PICC, and enteric tube remain in stable expected position. Normal heart  size. No pneumothorax IMPRESSION: Stable hardware positioning and hazy chest opacification usually related to atelectasis and pleural fluid. Electronically Signed   By: Marnee Spring M.D.   On: 02/02/2020 07:18   DG CHEST PORT 1 VIEW  Result Date: 02/01/2020 CLINICAL DATA:  Tracheostomy placement.  Respiratory failure EXAM: PORTABLE CHEST 1 VIEW COMPARISON:  December 01, 2019 FINDINGS: Tracheostomy  catheter tip is 3.7 cm above the carina. Nasogastric tube tip and side port are in the stomach. Central catheter tip is at the cavoatrial junction. No pneumothorax. There are bilateral pleural effusions with areas of ill-defined atelectasis and airspace opacity in both mid and lower lung zones. Heart is upper normal in size with pulmonary vascularity normal. No adenopathy. No bone lesions. IMPRESSION: Tube and catheter positions as described without pneumothorax. Bilateral pleural effusions with areas of atelectasis and airspace opacity bilaterally. Concern for potential multifocal pneumonia. Stable cardiac silhouette. Electronically Signed   By: Bretta Bang III M.D.   On: 02/01/2020 08:43    Anti-infectives: Anti-infectives (From admission, onward)   Start     Dose/Rate Route Frequency Ordered Stop   02/02/20 0800  ceFEPIme (MAXIPIME) 2 g in sodium chloride 0.9 % 100 mL IVPB     2 g 200 mL/hr over 30 Minutes Intravenous Every 8 hours 02/02/20 0727     02/01/20 1200  anidulafungin (ERAXIS) 100 mg in sodium chloride 0.9 % 100 mL IVPB     100 mg 78 mL/hr over 100 Minutes Intravenous Every 24 hours 01/31/20 1034     02/01/20 0800  piperacillin-tazobactam (ZOSYN) IVPB 3.375 g  Status:  Discontinued     3.375 g 12.5 mL/hr over 240 Minutes Intravenous Every 8 hours 02/01/20 0735 02/02/20 0721   01/31/20 1200  anidulafungin (ERAXIS) 200 mg in sodium chloride 0.9 % 200 mL IVPB     200 mg 78 mL/hr over 200 Minutes Intravenous  Once 01/31/20 1034 01/31/20 1730   01/27/20 1200  cefTRIAXone (ROCEPHIN) 2 g in sodium chloride 0.9 % 100 mL IVPB  Status:  Discontinued     2 g 200 mL/hr over 30 Minutes Intravenous Every 24 hours 01/27/20 1041 02/01/20 0735   01/26/20 1000  anidulafungin (ERAXIS) 100 mg in sodium chloride 0.9 % 100 mL IVPB  Status:  Discontinued     100 mg 78 mL/hr over 100 Minutes Intravenous Every 24 hours 01/25/20 0956 01/27/20 1041   01/25/20 1100  anidulafungin (ERAXIS) 200 mg in  sodium chloride 0.9 % 200 mL IVPB     200 mg 78 mL/hr over 200 Minutes Intravenous  Once 01/25/20 1006 01/25/20 1525   01/18/20 1400  piperacillin-tazobactam (ZOSYN) IVPB 3.375 g  Status:  Discontinued     3.375 g 12.5 mL/hr over 240 Minutes Intravenous Every 8 hours 01/18/20 1311 01/27/20 1041   01/18/20 0715  piperacillin-tazobactam (ZOSYN) IVPB 3.375 g     3.375 g 100 mL/hr over 30 Minutes Intravenous  Once 01/18/20 0707 01/18/20 0837       Assessment/Plan HTN Prediabetic- A1c6.3,SSI Tobacco abuse 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  Unknown origin.  VAP being treated, fluid in abdomen simple and unable to be drained, but unlikely to be infected per radiology assessment, VTE being treated, UA negative, blood cultures all negative,  2 in last week.  Repeat sputum culture just to make sure nothing is else growing, but otherwise no other known source.  Constipation improving.  Could be psych meds or abx fevers.  Given WBC is decreasing, may need to consider discontinuation of abx soon to see if this helps. Anemia - tx 1 unit of pRBCs 5/18.  hgb 9.8, stable R brachial DVT - lovenox70mg  BID  Septic shock Sigmoid colon perforation secondary to chronic constipation and ulceration with feculent peritonitis and gross contamination of abdominal cavity with stool balls -S/pExploratory laparotomy with partial colectomy and end colostomy5/5 Dr. Luisa Hart - POD#15 - surgical path:Perforation with acute inflammation and acute serositis, no evidence of malignancy -wound vac MWF - WOC RNfollowingfor new colostomyand vac -WBC 11.4 today -tolerating TFs, DC TNA -SMOG enema helped significantly  with constipation. -IR unable to drain fluid in pelvis, but does appear simple on [place] inconsistent with infectious process.  ID -zosyn 5/5>>5/14, 5/19 -->5/20 eraxis 5/12>>5/14, 5/19 --> Rocephin 5/14 -->5/19.  Cefepime 5/20--> FEN -NGT/TFs, wean TNA to off today VTE -SCDs, lovenox70mg  BID Foley -d/c 5/13   LOS: 15 days    Letha Cape , [facility] 02/02/2020, 8:40 AM Please see Amion for pager number during day hours 7:00am-4:30pm or 7:00am -11:30am on weekends

## 2020-02-02 NOTE — Progress Notes (Signed)
While changing ostomy pouch and bed pad, pt motioned that she felt nauseas. Pt sat up and vommited about 500cc of thick tan fluid. Oral suction and et suction performed. Tube feeds stopped. NG to suction temporarily. Cornett, MD notified of event and that TNA had been discontinued under the assumption patient would tolerate tube feeds. Orders to hold tube feeds for the night and add D5NS @ 120mL/ hr. Will continue to monitor.

## 2020-02-02 NOTE — Progress Notes (Signed)
PHARMACY NOTE -  Cefepime  Pharmacy has been assisting with dosing of cefepime for pneumonia.  Dosage remains stable at 2g IV q8 hr and need for further dosage adjustment appears unlikely at present given renal function at baseline  Pharmacy will sign off, following peripherally for culture results or dose adjustments. Please reconsult if a change in clinical status warrants re-evaluation of dosage.  Bernadene Person, PharmD, BCPS [phone] 02/02/2020, 7:27 AM

## 2020-02-02 NOTE — Progress Notes (Addendum)
NAME:  Darlene Castillo, MRN:  [id number], DOB:  07-20-1961, LOS: 15 ADMISSION DATE:  01/18/2020, CONSULTATION DATE:  01/18/20 REFERRING MD:  Harriette Bouillon CHIEF COMPLAINT:  VDRF, septic shock  Brief History   59 y/o F admitted 5/5 with abdominal pain, constipation and one episode of vomiting. Found to have a perforated sigmoid colon with peritonitis.  To OR for exploratory laparotomy with partial colectomy and end colostomy (negative pathology), returned to ICU post-operatively on mechanical ventilation.   Past Medical History  Chronic opiate use Tobacco abuse DM HTN  Significant Hospital Events   5/05 Admit, ex lap with partial colectomy and end colostomy for perf:  neg path 5/11 Extubated 5/12 Failed extubation.  Initially DNI but reversed.   5/13  Back on vent. Fever curve better. Concern for A Fib but EKG sinus tach with PAC. Down to 40% fio2 . TF on hold but getting meds via NG. On TPN. On dilaudid gtt, scheduled methadone, versed gtt, clonidine, seroquel and nicotine patch. Started on IV heparin for RUE DVT  5/14 dc versed gtt. Working on weaning sedation. Changing abx to rocephin 5/15 Methadone held at family request  5/16 Restarted methadone due to significant agitation 5/18 Agitation overnight, sedation gtt's increased 5/19 Trach.  Dilaudid stopped. Intra stomal enema  5/20 Sedation reduction   Consults:  CCS PCCM  Procedures:  R IJ CVL 5/5 >> 5/6 ALine 5/5 >> 5/7  ETT 5/5 >> 5/11, replaced 5/12 >> 5/19  Trach 5/19 >> LUE PICC 5/6 >>  Significant Diagnostic Tests:   CT abdomen pelvis with contrast 01/17/2020 >> scattered groundglass opacities in the lower lungs bilaterally, no focal opacities.  Abdominal free air.  Multiple dilated loops of bowel with stool.  Large R renal cyst.  Path 5/5 >> Perforation with acute inflammation and acute serositis. No evidence of malignancy.    [place] UE 5/13 >> RUE DVT  CT ABD / Pelvis w Contrast 5/17 >> increasingly organized apearance of  LLQ & pelvic fluid with slight decrease in the deep pelvic fluid.  Slight increase in LLQ fluid. Interloop fluid in the left hemiabdomen is slightly increased overall.  Diffuse interloop fluid and mesenteric stranding is slightly diminished. Persistent small areas of loculated perihepatic fluid unchanged, largest area in the axial plane measureing 2.5 x 1.7 cm. Decreased formed stool in the colon but with slight increase in colonic distention particularly of the transverse colon.  May reflect colonic ileus.  Persistent bibasilar consolidation and pleural effusions without significant change.   Micro Data:  Covid 5/5 >> negative Flu PCR 5/5 >> negative MRSA PCR 5/12 >> neg Tracheal aspirate 5/12 >> klebsiella > R ampicillin, Unasyn, I: zosyn Urine analysis 5/12 >> negative  Blood culture 5/12 >> negative  BCx2 5/18 >>  Antimicrobials:  Zosyn 5/5 >> 5/14 Eraxis 5/12 >> 5/14 Rocephin 5/14 >> Eraxis 5/18 >> 5/20 Zosyn 5/19 >> 5/19 Cefepime 5/20 >>   Interim history/subjective:  Tmax 101.5 / WBC 11.4 I/O 2.6L UOP, 1.1 stool, -1.3L in 24hours Glucose range 112-168 (note one lab reading of 526, suspect drawn off TPN line) On vent - 30% FiO2, PEEP 5 RN reports versed increased to 4mg  overnight + boluses.  Decreased dosing this am.   Objective   Blood pressure 118/68, pulse (!) 111, temperature (!) 101.2 F (38.4 C), temperature source Oral, resp. rate (!) 37, height 5\' 1"  (1.549 m), weight 70.6 kg, last menstrual period 05/23/2013, SpO2 98 %.    Vent Mode: PRVC FiO2 (%):  [  30 %-40 %] 30 % Set Rate:  [16 bmp-26 bmp] 16 bmp Vt Set:  [330 mL-380 mL] 380 mL PEEP:  [5 cmH20] 5 cmH20 Plateau Pressure:  [14 cmH20-23 cmH20] 23 cmH20   Intake/Output Summary (Last 24 hours) at 02/02/2020 0804 Last data filed at 02/02/2020 8413 Gross per 24 hour  Intake 2430.54 ml  Output 3825 ml  Net -1394.46 ml   Filed Weights   01/30/20 0418 01/31/20 1839 02/01/20 0418  Weight: 72.2 kg 69.4 kg 70.6 kg    Exam: General: ill appearing adult female lying in bed in NAD HEENT: MM pink/moist, trach midline c/d/i, anicteric  Neuro: awakens to voice, nods, follows commands, MAE  CV: s1s2 RRR, no m/r/g PULM:  Non-labored on vent, coarse breath sounds bilaterally GI: soft, midline VAC in place, LLQ ostomy  Extremities: warm/dry, pitting edema noted in upper posterior thighs / buttocks  Skin: no rashes or lesions  Resolved problems  Circulatory/septic shock  Assessment & Plan:  LOS 15 days   Acute hypoxic respiratory failure requiring mechanical ventilation, complicated by Klebsiella PNA.  -continue PRVC 8cc/kg, rate 16 -wean PEEP / FiO2 for sats >90% -daily SBT / WUA -reduce sedation  -abx expanded to cefepime per CCS, D7/x for pulmonary + abdominal coverage -VAP prevention measures  -trach care per protocol, clip trach sutures ~ 4/28 -lasix 40 mg IV x1   Acute Metabolic Encephalopathy, started 5/12 Chronic Pain, on oral methadone PTA Difficulties with intermittent agitation, family requests for patient to come off home medications, agitated delirium. Improved 5/17.   -Minimize sedating medications as able -klonopin taper to off in place -discontinue versed gtt  -reduce ceiling on clonidine  -PAD protocol with precedex -IV methadone for pain  -continue seroquel   Peritonitis 2/2 Sigmoid Colon Perforation from Chronic Constipation s/p ex lap with sigmoid resection and colostomy. Completed 10 days abx for initial peritonitis. Required enema for constipation 5/19. -post operative care per CCS -follow stoma output  -pain control per CCS  Right Brachial DVT -continue lovenox   Moderate Malnutrition Expected prolonged ileus post- op, which is improving. -TF per CCS -anticipate TPN will be stopped soon   Intermittent Fluid and Electrolyte imbalance-net positive volume status over the last few days -follow electrolytes, replace as indicated   Hyperglycemia -SSI   Anemia   Suspect multifactorial in setting of critical illness, recurrent phlebotomy. No acute evidence of bleeding  -trend CBC  -transfuse for Hgb <7% or active bleeding   Intermittent Fevers Persistent intermittent fever despite abx.  DVT identified and being treated with full dose anticoagulation. Consider right chest effusion, ongoing constipation, drug fever, sinusitis. GB normal on CT imaging, less likely acalculous cholecystitis.  -abx as above -follow cultures to maturity  -no foley  -assess of right pleural space with [place] -assess PCT  -assess CT sinuses -stop eraxis (9-18% risk of drug fever, WBC has reduced)  Mild Elevation of LFT's  -follow LFT's -avoid hepatotoxic agents  Best practice:  Diet: NPO; TPN & TF Pain/Anxiety/Delirium protocol (if indicated): see above VAP protocol (if indicated): In place DVT prophylaxis: SCDs and lovenox GI prophylaxis: pepcid Glucose control: SSI Mobility: bedrest Code Status: full code Family Communication: Mother updated via phone 5/20.  Disposition: ICU   BMP Latest Ref Rng & Units 02/02/2020 02/01/2020 01/31/2020  Glucose 70 - 99 mg/dL 526(HH) 150(H) 128(H)  BUN 6 - 20 mg/dL 21(H) 23(H) 29(H)  Creatinine 0.44 - 1.00 mg/dL 0.60 0.54 0.62  Sodium 135 - 145 mmol/L 132(L) 140 138  Potassium 3.5 - 5.1 mmol/L 4.9 3.5 3.8  Chloride 98 - 111 mmol/L 96(L) 103 100  CO2 22 - 32 mmol/L 25 27 27   Calcium 8.9 - 10.3 mg/dL 7.9(L) 8.5(L) 8.4(L)    CBC    Component Value Date/Time   WBC 11.4 (H) 02/02/2020 0403   RBC 3.68 (L) 02/02/2020 0403   HGB 9.8 (L) 02/02/2020 0403   HCT 31.1 (L) 02/02/2020 0403   PLT 870 (H) 02/02/2020 0403   MCV 84.5 02/02/2020 0403   MCH 26.6 02/02/2020 0403   MCHC 31.5 02/02/2020 0403   RDW 14.7 02/02/2020 0403   LYMPHSABS 0.9 01/31/2020 0701   MONOABS 0.9 01/31/2020 0701   EOSABS 0.1 01/31/2020 0701   BASOSABS 0.0 01/31/2020 0701    Critical Care Time: 35 minutes  02/02/2020, MSN, NP-C [facility] 02/02/2020, 8:04 AM   Please see [email or web address] for pager details.

## 2020-02-03 DIAGNOSIS — Z930 Tracheostomy status: Secondary | ICD-10-CM

## 2020-02-03 LAB — BASIC METABOLIC PANEL
Anion gap: 3 — ABNORMAL LOW (ref 5–15)
Anion gap: 11 (ref 5–15)
BUN: 21 mg/dL — ABNORMAL HIGH (ref 6–20)
BUN: 24 mg/dL — ABNORMAL HIGH (ref 6–20)
CO2: 24 mmol/L (ref 22–32)
CO2: 28 mmol/L (ref 22–32)
Calcium: 7.5 mg/dL — ABNORMAL LOW (ref 8.9–10.3)
Calcium: 8.8 mg/dL — ABNORMAL LOW (ref 8.9–10.3)
Chloride: 117 mmol/L — ABNORMAL HIGH (ref 98–111)
Chloride: 104 mmol/L (ref 98–111)
Creatinine, Ser: 0.78 mg/dL (ref 0.44–1.00)
Creatinine, Ser: 0.75 mg/dL (ref 0.44–1.00)
GFR calc Af Amer: >60 mL/min (ref >60)
GFR calc Af Amer: >60 mL/min (ref >60)
GFR calc non Af Amer: >60 mL/min (ref >60)
GFR calc non Af Amer: >60 mL/min (ref >60)
Glucose, Bld: 83 mg/dL (ref 70–99)
Glucose, Bld: 123 mg/dL — ABNORMAL HIGH (ref 70–99)
Potassium: 2.7 mmol/L — CL (ref 3.5–5.1)
Potassium: 4.2 mmol/L (ref 3.5–5.1)
Sodium: 144 mmol/L (ref 135–145)
Sodium: 143 mmol/L (ref 135–145)

## 2020-02-03 LAB — GLUCOSE, CAPILLARY
Glucose-Capillary: 159 mg/dL — ABNORMAL HIGH (ref 70–99)
Glucose-Capillary: 74 mg/dL (ref 70–99)
Glucose-Capillary: 57 mg/dL — ABNORMAL LOW (ref 70–99)
Glucose-Capillary: 99 mg/dL (ref 70–99)
Glucose-Capillary: 90 mg/dL (ref 70–99)
Glucose-Capillary: 115 mg/dL — ABNORMAL HIGH (ref 70–99)
Glucose-Capillary: 114 mg/dL — ABNORMAL HIGH (ref 70–99)

## 2020-02-03 LAB — CBC
HCT: 23.2 % — ABNORMAL LOW (ref 36.0–46.0)
Hemoglobin: 7.3 g/dL — ABNORMAL LOW (ref 12.0–15.0)
MCH: 26.5 pg (ref 26.0–34.0)
MCHC: 31.5 g/dL (ref 30.0–36.0)
MCV: 84.4 fL (ref 80.0–100.0)
Platelets: 857 10*3/uL — ABNORMAL HIGH (ref 150–400)
RBC: 2.75 MIL/uL — ABNORMAL LOW (ref 3.87–5.11)
RDW: 14.8 % (ref 11.5–15.5)
WBC: 12.9 10*3/uL — ABNORMAL HIGH (ref 4.0–10.5)
nRBC: 0 % (ref 0.0–0.2)

## 2020-02-03 LAB — PROCALCITONIN: Procalcitonin: 0.63 ng/mL

## 2020-02-03 MED ORDER — VITAL AF 1.2 CAL PO LIQD
1000.0000 mL | ORAL | Status: DC
  Administered 2020-02-03: 1000 mL

## 2020-02-03 MED ORDER — ACETAMINOPHEN 10 MG/ML IV SOLN: 1000.0000 mg | Freq: Four times a day (QID) | INTRAVENOUS | Status: DC

## 2020-02-03 MED ORDER — DEXTROSE 50 % IV SOLN
INTRAVENOUS | Status: AC
  Administered 2020-02-03: 25 mL
  Filled 2020-02-03: qty 50

## 2020-02-03 MED ORDER — POTASSIUM CHLORIDE 10 MEQ/50ML IV SOLN
10.0000 meq | INTRAVENOUS | Status: AC
  Administered 2020-02-03 (×4): 10 meq via INTRAVENOUS
  Filled 2020-02-03 (×4): qty 50

## 2020-02-03 MED ORDER — ACETAMINOPHEN 10 MG/ML IV SOLN
1000.0000 mg | Freq: Four times a day (QID) | INTRAVENOUS | Status: AC | PRN
  Administered 2020-02-03 – 2020-02-04 (×2): 1000 mg via INTRAVENOUS
  Filled 2020-02-03 (×2): qty 100

## 2020-02-03 MED ORDER — ENOXAPARIN SODIUM 80 MG/0.8ML ~~LOC~~ SOLN
65.0000 mg | Freq: Two times a day (BID) | SUBCUTANEOUS | Status: DC
  Administered 2020-02-03 – 2020-02-05 (×3): 65 mg via SUBCUTANEOUS
  Filled 2020-02-03 (×9): qty 0.65

## 2020-02-03 MED ORDER — PRO-STAT SUGAR FREE PO LIQD
30.0000 mL | Freq: Every day | ORAL | Status: DC
  Administered 2020-02-03 – 2020-02-06 (×3): 30 mL
  Filled 2020-02-03 (×2): qty 30

## 2020-02-03 MED ORDER — SALINE SPRAY 0.65 % NA SOLN: 1.0000 | NASAL | Status: DC | PRN

## 2020-02-03 MED ORDER — POTASSIUM CHLORIDE 20 MEQ/15ML (10%) PO SOLN
20.0000 meq | Freq: Once | ORAL | Status: AC
  Administered 2020-02-05: 20 meq
  Filled 2020-02-03: qty 15

## 2020-02-03 MED ORDER — FUROSEMIDE 10 MG/ML IJ SOLN
40.0000 mg | Freq: Once | INTRAMUSCULAR | Status: AC
  Administered 2020-02-03: 40 mg via INTRAVENOUS
  Filled 2020-02-03: qty 4

## 2020-02-03 MED ORDER — POTASSIUM CHLORIDE 10 MEQ/100ML IV SOLN
10.0000 meq | INTRAVENOUS | Status: AC
  Administered 2020-02-03 (×4): 10 meq via INTRAVENOUS
  Filled 2020-02-03 (×4): qty 100

## 2020-02-03 MED ORDER — ONDANSETRON HCL 4 MG/2ML IJ SOLN
4.0000 mg | Freq: Three times a day (TID) | INTRAMUSCULAR | Status: DC | PRN
  Administered 2020-02-03 – 2020-02-11 (×16): 4 mg via INTRAVENOUS
  Filled 2020-02-03 (×17): qty 2

## 2020-02-03 MED ORDER — PROCHLORPERAZINE EDISYLATE 10 MG/2ML IJ SOLN
10.0000 mg | Freq: Four times a day (QID) | INTRAMUSCULAR | Status: DC | PRN
  Administered 2020-02-04 – 2020-02-09 (×8): 10 mg via INTRAVENOUS
  Filled 2020-02-03 (×9): qty 2

## 2020-02-03 MED ORDER — SALINE SPRAY 0.65 % NA SOLN
1.0000 | Freq: Two times a day (BID) | NASAL | Status: DC
  Administered 2020-02-13 – 2020-02-14 (×2): 1 via NASAL

## 2020-02-03 MED ORDER — OXYMETAZOLINE HCL 0.05 % NA SOLN
1.0000 | Freq: Two times a day (BID) | NASAL | Status: DC
  Administered 2020-02-05 (×2): 1 via NASAL
  Filled 2020-02-03: qty 15

## 2020-02-03 MED ORDER — PRO-STAT SUGAR FREE PO LIQD: 20.0000 mL | Freq: Every day | ORAL | Status: DC

## 2020-02-03 MED ORDER — POTASSIUM CHLORIDE 20 MEQ/15ML (10%) PO SOLN: 40.0000 meq | Freq: Two times a day (BID) | ORAL | Status: AC

## 2020-02-03 NOTE — Evaluation (Signed)
Physical Therapy Evaluation Patient Details Name: Darlene Castillo MRN: [id number] DOB: [birth year] Today's Date: 02/03/2020   History of Present Illness  59 yo female admitted 01/18/20 with the diagnosis of pneumoperitoneum, peritonitis,  perforated sigmoid colon status post ex lap with colectomy and ostomy remains on the ventilator/trach.  Unable to wean successfully d/t ARF with hypoxemia in setting of major abdominal surgery and acute pulmonary edema.PMH: tobacco use, HTN  Clinical Impression  Pt admitted with above diagnosis.  Pt minimally cooperative with PT/OT today d/t c/o abd pain. Pt was apparently independent at her baseline per chart.  Pt is agreeable to some LE/UE  AROM in bed however declines even attempts to fully roll in the bed. Difficult to fully assess cognition d/t trach however she is alert and following 1 step commands.  HR 100-117 max RR 37-44 max  Vent Mode: PSV;CPAP FiO2 (%):  30 % Set Rate: 16 bmp Vt Set:  380 mL PEEP:  5 cmH20 Pressure Support: [10 cmH20]   Pt currently with functional limitations due to the deficits listed below (see PT Problem List). Pt will benefit from skilled PT to increase their independence and safety with mobility to allow discharge to the venue listed below.       Follow Up Recommendations LTACH    Equipment Recommendations  None recommended by PT    Recommendations for Other Services       Precautions / Restrictions Precautions Precautions: Fall Precaution Comments: trach, NGT, multiple lines Restrictions Weight Bearing Restrictions: No      Mobility  Bed Mobility Overal bed mobility: Needs Assistance Bed Mobility: Rolling Rolling: Min guard         General bed mobility comments: pt is reluctant to cooperate with rolling, performs partial roll with HOB elevated, complains of abd pain; pt refused sitting EOB or chair position  Transfers                 General transfer comment: NT/pt  refused  Ambulation/Gait                Stairs            Wheelchair Mobility    Modified Rankin (Stroke Patients Only)       Balance                                             Pertinent Vitals/Pain Pain Assessment: Faces Faces Pain Scale: Hurts even more Pain Location: abd Pain Descriptors / Indicators: Grimacing Pain Intervention(s): Limited activity within patient's tolerance;Monitored during session    Home Living Family/patient expects to be discharged to:: Other (Comment)(LTACH) Living Arrangements: (per chart spouse and children)                    Prior Function           Comments: pt was independent prior to admission, worked at day care per chart     Hand Dominance        Extremity/Trunk Assessment   Upper Extremity Assessment Upper Extremity Assessment: Defer to OT evaluation    Lower Extremity Assessment Lower Extremity Assessment: RLE deficits/detail RLE Deficits / Details: bil LEs at least 3/5, AROM WFL       Communication   Communication: No difficulties  Cognition Arousal/Alertness: Awake/alert Behavior During Therapy: Anxious Overall Cognitive Status: Difficult to assess  General Comments      Exercises     Assessment/Plan    PT Assessment Patient needs continued PT services  PT Problem List Decreased strength;Decreased activity tolerance;Decreased mobility;Pain;Decreased cognition       PT Treatment Interventions Therapeutic exercise;DME instruction;Gait training;Functional mobility training;Therapeutic activities;Patient/family education;Balance training    PT Goals (Current goals can be found in the Care Plan section)  Acute Rehab PT Goals Patient Stated Goal: none stated/trach PT Goal Formulation: Patient unable to participate in goal setting Time For Goal Achievement: 02/17/20 Potential to Achieve Goals: Fair     Frequency Min 2X/week   Barriers to discharge        Co-evaluation               AM-PAC PT "6 Clicks" Mobility  Outcome Measure Help needed turning from your back to your side while in a flat bed without using bedrails?: A Little Help needed moving from lying on your back to sitting on the side of a flat bed without using bedrails?: A Little Help needed moving to and from a bed to a chair (including a wheelchair)?: A Lot Help needed standing up from a chair using your arms (e.g., wheelchair or bedside chair)?: A Lot Help needed to walk in hospital room?: Total Help needed climbing 3-5 steps with a railing? : Total 6 Click Score: 12    End of Session   Activity Tolerance: Patient limited by fatigue;Patient limited by pain Patient left: in bed;with call bell/phone within reach;with bed alarm set;with nursing/sitter in room Nurse Communication: Mobility status PT Visit Diagnosis: Other abnormalities of gait and mobility (R26.89);Muscle weakness (generalized) (M62.81)    Time: 6203-5597 PT Time Calculation (min) (ACUTE ONLY): 14 min   Charges:   PT Evaluation $PT Eval Moderate Complexity: 1 Mod          Osamah Schmader, PT   Acute Rehab Dept [facility]): [phone]   02/03/2020   [facility] 02/03/2020, 2:00 PM

## 2020-02-03 NOTE — TOC Progression Note (Addendum)
Transition of Care [facility]) - Progression Note    Patient Details  Name: Darlene Castillo MRN: [id number] Date of Birth: 23-Jul-1961  Transition of Care [facility]) CM/SW Contact  Golda Acre, RN Phone Number: 02/03/2020, 7:47 AM  Clinical Narrative:    Awake and on vent with trach at 30%fi02,wound vac inplace, temp 101.2, wbc 12.9, hgb=7.3, K+=2.7 iv potassium running, iv tpon off, tube feeds, BCX2X3D=Neg. Plan ltach text sent to both ltach's to see if candidate. tct-husband p[lease call mother concerning the ltach of choice. tct-Elsie Reiber the mother-message left to return call.  Expected Discharge Plan: Home/Self Care Barriers to Discharge: Continued Medical Work up  Expected Discharge Plan and Services Expected Discharge Plan: Home/Self Care       Living arrangements for the past 2 months: Single Family Home                                       Social Determinants of Health (SDOH) Interventions    Readmission Risk Interventions No flowsheet data found.

## 2020-02-03 NOTE — Consult Note (Signed)
WOC Nurse ostomy follow up Stoma type/location: LLQ colostomy Stomal assessment/size: 1 and 3/4 inch oval. Not measured today as no change is performed.  Current pouch is intact and is not incorporated into NPWT dressing Peristomal assessment: Not seen today Treatment options for stomal/peristomal skin: skin barrier ring Output: liquid tan effluent  Ostomy pouching: 2pc. 2 and 3/4 inch pouching system with skin barrier ring Education provided: None. Patient is on vent via trach and is not appropriate for teaching Enrolled patient in [facility] [employer] Discharge program: Yes, previously   WOC Nurse wound follow up Wound type:surgical, midline Measurement: 16 x 6 x 3.5cm Wound BPZ:WCHE, moist Drainage (amount, consistency, odor) scant serous Periwound: intact Dressing procedure/placement/frequency:One piece of black foam removed from wound bed, wound cleansed and patted gently dry. Skin barrier ring applied to most distal portion of wound on periwound skin to enhance seal. One piece of black foam inserted into wound-this is more difficult today since patient will not allow HOB to be lowered at all for the procedure and the pubis crease is more pronounced when patient is sitting. Foam is secured with drape; dressing attached to continuous negative pressure via the T.R.A.C.C. pad and an immediate seal is achieved. Patient tolerated procedure well.  The assistance of the bedside RN, Jon Gills is appreciated.  WOC nursing team will follow, and will remain available to this patient, the nursing and medical teams.  Next dressing change is due Monday, 02/06/20. SUpplies are in room. Thanks, Ladona Mow, MSN, RN, GNP, [place]  Pager# [phone]

## 2020-02-03 NOTE — Progress Notes (Signed)
ANTICOAGULATION CONSULT NOTE  Pharmacy Consult for Lovenox Indication: DVT , R brachial  Allergies  Allergen Reactions  . Ibuprofen Itching   Patient Measurements: Height: 5\' 1"  (154.9 cm) Weight: 64.4 kg (141 lb 15.6 oz) IBW/kg (Calculated) : 47.8 Heparin Dosing Weight: 67kg  Vital Signs: Temp: 98.4 F (36.9 C) (05/21 0337) Temp Source: Oral (05/21 0337) BP: 153/85 (05/21 0700) Pulse Rate: 94 (05/21 0723)  Labs: Recent Labs    02/01/20 0354 02/01/20 0354 02/02/20 0403 02/03/20 0500  HGB 8.3*   < > 9.8* 7.3*  HCT 26.0*  --  31.1* 23.2*  PLT 858*  --  870* 857*  APTT 41*  --   --   --   LABPROT 14.5  --   --   --   INR 1.2  --   --   --   CREATININE 0.54  --  0.60 0.78   < > = values in this interval not displayed.   Estimated Creatinine Clearance: 65 mL/min (by C-G formula based on SCr of 0.78 mg/dL).  Assessment:  63 yoF s/p Sigmoid colon perforation secondary to chronic constipation and ulceration with feculent peritonitis and gross contamination of abdominal cavity -S/pExploratory laparotomy with partial colectomy and end colostomy5/5 Dr. 46 5/13: begin IV Heparin for R brachial DVT at 1000 units/hr, no bolus per CCM 5/15 heparin >> Lovenox 80 q12  02/03/2020  Hg 7.3  PLTC 857  Wt down to 64.4 kg  per CCM "Secondary thrombocytosis likely related to Pt's anemia and possible GI bleeding. Will send manual peripheral smear, and reticulocyte count, also iron studies. "   Plan:  decrease Lovenox to 65 mg [place] q12 (~ 1mg /kg q12) Consider change Lovenox to 1.5mg /kg q24 once out of ICU Monitor for s/s bleed CBC daily  02/05/2020 RPh 02/03/2020, 7:28 AM

## 2020-02-03 NOTE — Progress Notes (Signed)
tube feeding continue to be  on hold due to vomiting on 05/20. No emesis noted tonight. Continue with D5NS@ 100 ml.

## 2020-02-03 NOTE — Progress Notes (Addendum)
NAME:  Darlene Castillo, MRN:  [id number], DOB:  1961/07/23, LOS: 16 ADMISSION DATE:  01/18/2020, CONSULTATION DATE:  01/18/20 REFERRING MD:  Harriette Bouillon CHIEF COMPLAINT:  VDRF, septic shock  Brief History   59 y/o F admitted 5/5 with abdominal pain, constipation and one episode of vomiting. Found to have a perforated sigmoid colon with peritonitis.  To OR for exploratory laparotomy with partial colectomy and end colostomy (negative pathology), returned to ICU post-operatively on mechanical ventilation. Prolonged respiratory failure s/p tracheostomy 5/19.  Difficult to control agitation / pain.    Past Medical History  Chronic opiate use Tobacco abuse DM HTN  Significant Hospital Events   5/05 Admit, ex lap with partial colectomy and end colostomy for perf:  neg path 5/11 Extubated 5/12 Failed extubation.  Initially DNI but reversed.   5/13  Back on vent. Fever curve better. Concern for A Fib but EKG sinus tach with PAC. Down to 40% fio2 . TF on hold but getting meds via NG. On TPN. On dilaudid gtt, scheduled methadone, versed gtt, clonidine, seroquel and nicotine patch. Started on IV heparin for RUE DVT  5/14 dc versed gtt. Working on weaning sedation. Changing abx to rocephin 5/15 Methadone held at family request  5/16 Restarted methadone due to significant agitation 5/18 Agitation overnight, sedation gtt's increased 5/19 Trach.  Dilaudid stopped. Intra stomal enema  5/20 Sedation reduction  5/21 On PSV wean 10/5.  TF on hold with emesis overnight.   Consults:  CCS PCCM  Procedures:  R IJ CVL 5/5 >> 5/6 ALine 5/5 >> 5/7  ETT 5/5 >> 5/11, replaced 5/12 >> 5/19  Trach 5/19 >> LUE PICC 5/6 >>  Significant Diagnostic Tests:   CT abdomen pelvis with contrast 01/17/2020 >> scattered groundglass opacities in the lower lungs bilaterally, no focal opacities.  Abdominal free air.  Multiple dilated loops of bowel with stool.  Large R renal cyst.  Path 5/5 >> Perforation with acute  inflammation and acute serositis. No evidence of malignancy.    [place] UE 5/13 >> RUE DVT  CT ABD / Pelvis w Contrast 5/17 >> increasingly organized apearance of LLQ & pelvic fluid with slight decrease in the deep pelvic fluid.  Slight increase in LLQ fluid. Interloop fluid in the left hemiabdomen is slightly increased overall.  Diffuse interloop fluid and mesenteric stranding is slightly diminished. Persistent small areas of loculated perihepatic fluid unchanged, largest area in the axial plane measureing 2.5 x 1.7 cm. Decreased formed stool in the colon but with slight increase in colonic distention particularly of the transverse colon.  May reflect colonic ileus.  Persistent bibasilar consolidation and pleural effusions without significant change.   CT Sinus 5/20 >> sinus mucosal edema, bilateral mastoid effusions, no air fluid levels, no abscess in neck  Micro Data:  Covid 5/5 >> negative Flu PCR 5/5 >> negative MRSA PCR 5/12 >> neg Tracheal aspirate 5/12 >> klebsiella > R ampicillin, Unasyn, I: zosyn Urine analysis 5/12 >> negative  Blood culture 5/12 >> negative  UC 5/19 >> negative  BCx2 5/18 >> Tracheal Aspirate 5/20 >>   Antimicrobials:  Zosyn 5/5 >> 5/14 Eraxis 5/12 >> 5/14 Rocephin 5/14 >> Eraxis 5/18 >> 5/20 Zosyn 5/19 >> 5/19 Cefepime 5/20 >>   Interim history/subjective:  On PSV wean 10/5! Glucose range 74 - 162 Vomiting overnight, TF held  Tmax 101 / WBC 12.9  I/O 1L UOP, 1.3L stool, net event for last 24hours  Objective   Blood pressure (!) 153/85, pulse  94, temperature 100.1 F (37.8 C), temperature source Oral, resp. rate (!) 23, height 5\' 1"  (1.549 m), weight 64.4 kg, last menstrual period 05/23/2013, SpO2 96 %.    Vent Mode: PSV;CPAP FiO2 (%):  [30 %] 30 % Set Rate:  [16 bmp] 16 bmp Vt Set:  [380 mL] 380 mL PEEP:  [5 cmH20] 5 cmH20 Pressure Support:  [10 cmH20] 10 cmH20 Plateau Pressure:  [20 cmH20-24 cmH20] 20 cmH20   Intake/Output Summary (Last 24  hours) at 02/03/2020 0816 Last data filed at 02/03/2020 0700 Gross per 24 hour  Intake 2586.25 ml  Output 2575 ml  Net 11.25 ml   Filed Weights   01/31/20 1839 02/01/20 0418 02/02/20 1300  Weight: 69.4 kg 70.6 kg 64.4 kg   Exam: General: ill appearing female lying in bed on vent in NAD HEENT: MM pink/moist, trach midline c/d/i, sutures in place, anicteric Neuro: Awake, alert, interactive, MAE CV: s1s2 rrr, no m/r/g PULM: non-labored on vent, PSV ventilation, lungs bilaterally clear anterior, diminished bases GI: abd soft, midline VAC in place, LLQ ostomy pink Extremities: warm/dry, no edema in LE, thigh/buttock with trace pitting edema  Skin: no rashes or lesions  Resolved problems  Circulatory/septic shock  Assessment & Plan:  LOS 16 days   Acute hypoxic respiratory failure requiring mechanical ventilation, complicated by Klebsiella PNA.  -PRVC 8cc/kg as rest mode  -continue daily SBT wean as tolerated, goal for ATC  -PEEP / FiO2 to support sats >90% -minimize sedating medications as able  -continue cefepime, D8/x.  ABX expanded 5/20 for abdominal coverage per CCS.  At this point treated for PNA. -f/u CXR in am  -VAP prevention measures -Clip trach sutures ~ 4/28 -lasix 40 mg IV x1 -follow up electrolytes 5/21 PM   Acute Metabolic Encephalopathy, started 5/12 Chronic Pain, on oral methadone PTA Difficulties with intermittent agitation, family requests for patient to come off home medications, agitated delirium. Improved 5/17.   -minimize narcotics / sedating medications  -klonopin taper to off  -PAD protocol with precedex -IV methadone for pain control  -continue seroquel, will need taper off as improves  Peritonitis 2/2 Sigmoid Colon Perforation from Chronic Constipation s/p ex lap with sigmoid resection and colostomy. Completed 10 days abx for initial peritonitis. Required enema for constipation 5/19. -post operative care per CCS -follow stoma output  -pain control  per CCS  Right Brachial DVT -continue lovenox   Emesis -PRN zofran ordered  Moderate Malnutrition Expected prolonged ileus post- op, which is improving. -TF per CCS  Intermittent Fluid and Electrolyte imbalance - hypokalemia  -replace K -follow up BMP at 1400  -additional K ordered with lasix beyond replacement   Hyperglycemia -SSI   Anemia  Suspect multifactorial in setting of critical illness, recurrent phlebotomy. No acute evidence of bleeding  -follow CBC -transfuse for Hgb <7%  Intermittent Fevers Persistent intermittent fever despite abx.  DVT identified and being treated with full dose anticoagulation. Consider right chest effusion, ongoing constipation, drug fever, sinusitis. GB normal on CT imaging, less likely acalculous cholecystitis. CT sinuses discussed with radiology > appears like retained mucus in mastoid sinuses, air fluid levels expected with NGT in place, non-specific findings.  -abx as above -will treat with afrin for two days, nasal saline.  Could consider smaller bore NGT vs PEG but will defer this to CCS -follow cultures  -fever curve better 5/21, eraxis stopped 5/20 (risk of drug fever) -no foley  -trend PCT  Mild Elevation of LFT's  -follow LFT's  -avoid hepatotoxic  agents  Best practice:  Diet: NPO; TPN & TF Pain/Anxiety/Delirium protocol (if indicated): see above VAP protocol (if indicated): In place DVT prophylaxis: SCDs and lovenox GI prophylaxis: pepcid Glucose control: SSI Mobility: bedrest Code Status: full code Family Communication: Mother updated via phone 5/21 on plan of care.  Disposition: ICU   BMP Latest Ref Rng & Units 02/03/2020 02/02/2020 02/01/2020  Glucose 70 - 99 mg/dL 83 161(WR) 604(V)  BUN 6 - 20 mg/dL 40(J) 81(X) 91(Y)  Creatinine 0.44 - 1.00 mg/dL 7.82 9.56 2.13  Sodium 135 - 145 mmol/L 144 132(L) 140  Potassium 3.5 - 5.1 mmol/L 2.7(LL) 4.9 3.5  Chloride 98 - 111 mmol/L 117(H) 96(L) 103  CO2 22 - 32 mmol/L 24 25 27    Calcium 8.9 - 10.3 mg/dL 7.5(L) 7.9(L) 8.5(L)    CBC    Component Value Date/Time   WBC 12.9 (H) 02/03/2020 0500   RBC 2.75 (L) 02/03/2020 0500   HGB 7.3 (L) 02/03/2020 0500   HCT 23.2 (L) 02/03/2020 0500   PLT 857 (H) 02/03/2020 0500   MCV 84.4 02/03/2020 0500   MCH 26.5 02/03/2020 0500   MCHC 31.5 02/03/2020 0500   RDW 14.8 02/03/2020 0500   LYMPHSABS 0.9 01/31/2020 0701   MONOABS 0.9 01/31/2020 0701   EOSABS 0.1 01/31/2020 0701   BASOSABS 0.0 01/31/2020 0701    Critical Care Time: 34 minutes  02/02/2020, MSN, NP-C [facility] 02/03/2020, 8:16 AM   Please see [email or web address] for pager details.

## 2020-02-03 NOTE — Evaluation (Signed)
Occupational Therapy Evaluation Patient Details Name: Darlene Castillo MRN: [id number] DOB: [birth year] Today's Date: 02/03/2020    History of Present Illness 59 yo female admitted 01/18/20 with the diagnosis of pneumoperitoneum, peritonitis,  perforated sigmoid colon status post ex lap with colectomy and ostomy remains on the ventilator/trach.  Unable to wean successfully d/t ARF with hypoxemia in setting of major abdominal surgery and acute pulmonary edema.PMH: tobacco use, HTN   Clinical Impression   Pt admitted with the above diagnoses and presents with below problem list. Pt will benefit from continued acute OT to address the below listed deficits and maximize independence with basic ADLs prior to d/c to venue below. 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 Pt limited by abdominal pain and fatigue this session. With encouragement pt agreeable to work on initiating rolling to each side. Discussed benefits of therapy risks of immobility. Educated on basic bed level AROM exercises for BUE/BLE general strengthening and hip level exercises towards bed mobility and pre transfer goals.      Follow Up Recommendations  LTACH    Equipment Recommendations  Other (comment)(defer to next venue)    Recommendations for Other Services       Precautions / Restrictions Precautions Precautions: Fall Precaution Comments: trach, NGT, multiple lines Restrictions Weight Bearing Restrictions: No      Mobility Bed Mobility Overal bed mobility: Needs Assistance Bed Mobility: Rolling Rolling: Min guard         General bed mobility comments: pt is reluctant to cooperate with rolling, performs partial roll with HOB elevated, complains of abd pain; pt refused sitting EOB or chair position  Transfers                 General transfer comment: NT/pt refused    Balance                                           ADL either performed or assessed with clinical  judgement   ADL Overall ADL's : Needs assistance/impaired Eating/Feeding: NPO   Grooming: Maximal assistance;Bed level   Upper Body Bathing: Maximal assistance;Bed level   Lower Body Bathing: Bed level;Total assistance   Upper Body Dressing : Total assistance;Maximal assistance   Lower Body Dressing: Maximal assistance;Total assistance                 General ADL Comments: bed level evaluation this session including assess ment of BUE/BLE ROM and strength, worked on initiang rolling to each side. Pt limited by abdominal pain, fever.      Vision         Perception     Praxis      Pertinent Vitals/Pain Pain Assessment: Faces Faces Pain Scale: Hurts even more Pain Location: abd Pain Descriptors / Indicators: Grimacing Pain Intervention(s): Limited activity within patient's tolerance;Monitored during session     Hand Dominance     Extremity/Trunk Assessment Upper Extremity Assessment Upper Extremity Assessment: Generalized weakness   Lower Extremity Assessment Lower Extremity Assessment: Defer to PT evaluation RLE Deficits / Details: bil LEs at least 3/5, AROM WFL       Communication Communication Communication: No difficulties   Cognition Arousal/Alertness: Awake/alert Behavior During Therapy: Anxious Overall Cognitive Status: Difficult to assess  General Comments: answering questions appropriately. tearful at times. Initiating shaking OT/PT hands at end of session.    General Comments       Exercises Exercises: Other exercises Other Exercises Other Exercises: educated on benefits of AROM exercises of BUE/BLE at bed level to prevent further deconditioning and in preparation for mobilizing.   Shoulder Instructions      Home Living Family/patient expects to be discharged to:: Other (Comment)(LTACH) Living Arrangements: (per chart spouse and children)                                       Prior Functioning/Environment          Comments: pt was independent prior to admission, worked at day care per chart        OT Problem List: Decreased strength;Decreased activity tolerance;Impaired balance (sitting and/or standing);Decreased knowledge of use of DME or AE;Decreased knowledge of precautions;Cardiopulmonary status limiting activity;Pain      OT Treatment/Interventions: Self-care/ADL training;Therapeutic exercise;Energy conservation;DME and/or AE instruction;Therapeutic activities;Patient/family education;Balance training    OT Goals(Current goals can be found in the care plan section) Acute Rehab OT Goals Patient Stated Goal: none stated/trach OT Goal Formulation: With patient Time For Goal Achievement: 02/17/20 Potential to Achieve Goals: Good ADL Goals Pt Will Perform Grooming: with mod assist;bed level Pt Will Perform Upper Body Bathing: with mod assist;bed level Pt/caregiver will Perform Home Exercise Program: Increased strength;Both right and left upper extremity;With theraband;With Supervision;With written HEP provided Additional ADL Goal #1: Pt will sit EOB for 2 minutes at mod A level to prepare for EOB ADLs. Additional ADL Goal #2: Pt will complete bed mobility to sit EOB with mod A in preparation for OOB/EOB ADLs.  OT Frequency: Min 2X/week   Barriers to D/C:            Co-evaluation PT/OT/SLP Co-Evaluation/Treatment: Yes Reason for Co-Treatment: Complexity of the patient's impairments (multi-system involvement)   OT goals addressed during session: ADL's and self-care;Strengthening/ROM      AM-PAC OT "6 Clicks" Daily Activity     Outcome Measure Help from another person eating meals?: Total Help from another person taking care of personal grooming?: A Lot Help from another person toileting, which includes using toliet, bedpan, or urinal?: Total Help from another person bathing (including washing, rinsing, drying)?: Total Help from another person  to put on and taking off regular upper body clothing?: A Lot Help from another person to put on and taking off regular lower body clothing?: Total 6 Click Score: 8   End of Session    Activity Tolerance: Patient limited by pain;Other (comment);Patient limited by fatigue(fever) Patient left: in bed;with call bell/phone within reach  OT Visit Diagnosis: Other abnormalities of gait and mobility (R26.89);Muscle weakness (generalized) (M62.81);Pain                Time: 8657-8469 OT Time Calculation (min): 15 min Charges:  OT General Charges $OT Visit: 1 Visit OT Evaluation $OT Eval Moderate Complexity: [place], OT Acute Rehabilitation Services Pager: [phone] Office: [phone]   Hortencia Pilar 02/03/2020, 2:30 PM

## 2020-02-03 NOTE — Progress Notes (Signed)
Patient ID: ALDEA AVIS, female   DOB: 1961-07-07, 59 y.o.   MRN: [id number]    16 Days Post-Op  Subjective: Patient very awake this morning.  Responds appropriately to questions.  Apparently had a small amount of emesis overnight.  TFs  Held this morning currently.  Still putting out from her colostomy pouch.  Patient denies any significant abdominal pain.  ROS: unable on vent  Objective: Vital signs in last 24 hours: Temp:  [98.4 F (36.9 C)-101 F (38.3 C)] 100.1 F (37.8 C) (05/21 0800) Pulse Rate:  [85-121] 94 (05/21 0723) Resp:  [20-32] 23 (05/21 0723) BP: (129-157)/(55-95) 153/85 (05/21 0700) SpO2:  [93 %-100 %] 96 % (05/21 0746) FiO2 (%):  [30 %] 30 % (05/21 0746) Weight:  [64.4 kg] 64.4 kg (05/20 1300) Last BM Date: 02/03/20  Intake/Output from previous day: 05/20 0701 - 05/21 0700 In: 2586.3 [I.V.:1754.6; NG/GT:442.7; IV Piggyback:389] Out: 2575 [Urine:1000; Drains:250; Stool:1325] Intake/Output this shift: No intake/output data recorded.  PE: Gen: awake and alert Heart: regular Lungs: CTAB, trach in place Abd: softer, still with some bloating, but +BS, colostomy with liquid stool present in pouch, stoma is pink and viable.  Wound VAC in place  Lab Results:  Recent Labs    02/02/20 0403 02/03/20 0500  WBC 11.4* 12.9*  HGB 9.8* 7.3*  HCT 31.1* 23.2*  PLT 870* 857*   BMET Recent Labs    02/02/20 0403 02/03/20 0500  NA 132* 144  K 4.9 2.7*  CL 96* 117*  CO2 25 24  GLUCOSE 526* 83  BUN 21* 21*  CREATININE 0.60 0.78  CALCIUM 7.9* 7.5*   PT/INR Recent Labs    02/01/20 0354  LABPROT 14.5  INR 1.2   CMP     Component Value Date/Time   NA 144 02/03/2020 0500   K 2.7 (LL) 02/03/2020 0500   CL 117 (H) 02/03/2020 0500   CO2 24 02/03/2020 0500   GLUCOSE 83 02/03/2020 0500   BUN 21 (H) 02/03/2020 0500   CREATININE 0.78 02/03/2020 0500   CALCIUM 7.5 (L) 02/03/2020 0500   PROT 6.6 02/02/2020 0403   ALBUMIN 1.8 (L) 02/02/2020 0403   AST 63 (H)  02/02/2020 0403   ALT 53 (H) 02/02/2020 0403   ALKPHOS 294 (H) 02/02/2020 0403   BILITOT 0.3 02/02/2020 0403   GFRNONAA >60 02/03/2020 0500   GFRAA >60 02/03/2020 0500   Lipase     Component Value Date/Time   LIPASE 19 01/18/2020 0525       Studies/Results: US Abdomen Limited  Result Date: 02/01/2020 CLINICAL DATA:  Indeterminate intra-abdominal fluid. Please perform ascites search ultrasound ultrasound-guided paracentesis for diagnostic purposes as indicated. EXAM: LIMITED ABDOMEN ULTRASOUND FOR ASCITES TECHNIQUE: Limited ultrasound survey for ascites was performed in all four abdominal quadrants. COMPARISON:  CT abdomen pelvis-01/30/2020 FINDINGS: Sonographic evaluation of the abdomen demonstrates a trace amount of simple appearing anechoic fluid within the left lower abdomen/pelvis interposed between the midline abdominal wound and the left lower abdominal end colostomy. Given the small amount of fluid as well as poor sonographic window, ultrasound-guided paracentesis was not attempted. IMPRESSION: Trace amount of simple appearing anechoic/simple appearing fluid within the left lower abdomen/pelvis compatible with findings seen on preceding abdominal CT. Given small amount of fluid as well as poor sonographic window given adjacent midline wound and end colostomy, ultrasound-guided paracentesis not attempted. Electronically Signed   By: Simonne Come M.D.   On: 02/01/2020 12:43   CT MAXILLOFACIAL W CONTRAST  Result Date:  02/02/2020 CLINICAL DATA:  Sinusitis acute.  Fever of unknown etiology. EXAM: CT MAXILLOFACIAL WITH CONTRAST TECHNIQUE: Multidetector CT imaging of the maxillofacial structures was performed with intravenous contrast. Multiplanar CT image reconstructions were also generated. CONTRAST:  79mL OMNIPAQUE IOHEXOL 300 MG/ML  SOLN COMPARISON:  CT head 11/15/2019 FINDINGS: Osseous: No fracture or other acute skeletal abnormality. Orbits: No orbital mass or abscess. No orbital edema.  Globe normal bilaterally. Sinuses: Mucosal edema paranasal sinuses. No air-fluid level. Bilateral mastoid effusion. Middle ear clear bilaterally. Soft tissues: No soft tissue mass or abscess. Tracheostomy in good position. NG tube in the esophagus. Limited intracranial: Negative IMPRESSION: Sinus mucosal disease. Bilateral mastoid effusions. No air-fluid levels. No abscess in the neck. Electronically Signed   By: Franchot Gallo M.D.   On: 02/02/2020 13:54   DG Chest Port 1 View  Result Date: 02/02/2020 CLINICAL DATA:  Respiratory failure EXAM: PORTABLE CHEST 1 VIEW COMPARISON:  Yesterday FINDINGS: Haziness of the bilateral chest, usually pleural fluid and atelectasis. Tracheostomy tube, left PICC, and enteric tube remain in stable expected position. Normal heart size. No pneumothorax IMPRESSION: Stable hardware positioning and hazy chest opacification usually related to atelectasis and pleural fluid. Electronically Signed   By: Monte Fantasia M.D.   On: 02/02/2020 07:18   DG CHEST PORT 1 VIEW  Result Date: 02/01/2020 CLINICAL DATA:  Tracheostomy placement.  Respiratory failure EXAM: PORTABLE CHEST 1 VIEW COMPARISON:  December 01, 2019 FINDINGS: Tracheostomy catheter tip is 3.7 cm above the carina. Nasogastric tube tip and side port are in the stomach. Central catheter tip is at the cavoatrial junction. No pneumothorax. There are bilateral pleural effusions with areas of ill-defined atelectasis and airspace opacity in both mid and lower lung zones. Heart is upper normal in size with pulmonary vascularity normal. No adenopathy. No bone lesions. IMPRESSION: Tube and catheter positions as described without pneumothorax. Bilateral pleural effusions with areas of atelectasis and airspace opacity bilaterally. Concern for potential multifocal pneumonia. Stable cardiac silhouette. Electronically Signed   By: Lowella Grip III M.D.   On: 02/01/2020 08:43    Anti-infectives: Anti-infectives (From admission,  onward)   Start     Dose/Rate Route Frequency Ordered Stop   02/02/20 0800  ceFEPIme (MAXIPIME) 2 g in sodium chloride 0.9 % 100 mL IVPB     2 g 200 mL/hr over 30 Minutes Intravenous Every 8 hours 02/02/20 0727     02/01/20 1200  anidulafungin (ERAXIS) 100 mg in sodium chloride 0.9 % 100 mL IVPB  Status:  Discontinued     100 mg 78 mL/hr over 100 Minutes Intravenous Every 24 hours 01/31/20 1034 02/02/20 0942   02/01/20 0800  piperacillin-tazobactam (ZOSYN) IVPB 3.375 g  Status:  Discontinued     3.375 g 12.5 mL/hr over 240 Minutes Intravenous Every 8 hours 02/01/20 0735 02/02/20 0721   01/31/20 1200  anidulafungin (ERAXIS) 200 mg in sodium chloride 0.9 % 200 mL IVPB     200 mg 78 mL/hr over 200 Minutes Intravenous  Once 01/31/20 1034 01/31/20 1730   01/27/20 1200  cefTRIAXone (ROCEPHIN) 2 g in sodium chloride 0.9 % 100 mL IVPB  Status:  Discontinued     2 g 200 mL/hr over 30 Minutes Intravenous Every 24 hours 01/27/20 1041 02/01/20 0735   01/26/20 1000  anidulafungin (ERAXIS) 100 mg in sodium chloride 0.9 % 100 mL IVPB  Status:  Discontinued     100 mg 78 mL/hr over 100 Minutes Intravenous Every 24 hours 01/25/20 0956 01/27/20 1041  01/25/20 1100  anidulafungin (ERAXIS) 200 mg in sodium chloride 0.9 % 200 mL IVPB     200 mg 78 mL/hr over 200 Minutes Intravenous  Once 01/25/20 1006 01/25/20 1525   01/18/20 1400  piperacillin-tazobactam (ZOSYN) IVPB 3.375 g  Status:  Discontinued     3.375 g 12.5 mL/hr over 240 Minutes Intravenous Every 8 hours 01/18/20 1311 01/27/20 1041   01/18/20 0715  piperacillin-tazobactam (ZOSYN) IVPB 3.375 g     3.375 g 100 mL/hr over 30 Minutes Intravenous  Once 01/18/20 0707 01/18/20 0837       Assessment/Plan HTN Prediabetic- A1c6.3,SSI Tobacco abuse 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  Agree with DC of eraxis.  CT sinus with some edema as likely expected from NGT, but no infectious etiology noted. Anemia- tx 1 unit of pRBCs 5/18. hgb 9.8, stable R brachial DVT- lovenox70mg  BID  Septic shock Sigmoid colon perforation secondary to chronic constipation and ulceration with feculent peritonitis and gross contamination of abdominal cavity with stool balls -S/pExploratory laparotomy with partial colectomy and end colostomy5/5 Dr. Luisa Hart - POD#16 - surgical path:Perforation with acute inflammation and acute serositis, no evidence of malignancy -wound vac MWF - WOC RNfollowingfor new colostomyand vac -1 episode of emesis overnight.  Will decrease TFs back to 20cc and advance very slowly back up to goal rate.  Colostomy is working well and may need TFs advanced less rapidly to see if she tolerates this better. -IR unable to drain fluid in pelvis, but does appear simple on [place] inconsistent with infectious process.  ID -zosyn 5/5>>5/14,5/19 -->5/20eraxis 5/12>>5/14, 5/19 -->5/20Rocephin 5/14 -->5/19.  Cefepime 5/20--> FEN -NGT/TFs VTE -SCDs, lovenox70mg  BID Foley -d/c 5/13 Dispo - ?LTAC, CM looking into this   LOS: 16 days    Letha Cape , [facility] 02/03/2020, 8:15 AM Please see Amion for pager number during day hours 7:00am-4:30pm or 7:00am -11:30am on weekends

## 2020-02-04 ENCOUNTER — Inpatient Hospital Stay (HOSPITAL_COMMUNITY): Payer: BLUE CROSS/BLUE SHIELD

## 2020-02-04 DIAGNOSIS — J9620 Acute and chronic respiratory failure, unspecified whether with hypoxia or hypercapnia: Secondary | ICD-10-CM

## 2020-02-04 LAB — MAGNESIUM: Magnesium: 1.3 mg/dL — ABNORMAL LOW (ref 1.7–2.4)

## 2020-02-04 LAB — BASIC METABOLIC PANEL
Anion gap: 3 — ABNORMAL LOW (ref 5–15)
BUN: 19 mg/dL (ref 6–20)
CO2: 20 mmol/L — ABNORMAL LOW (ref 22–32)
Calcium: 5.7 mg/dL — CL (ref 8.9–10.3)
Chloride: 122 mmol/L — ABNORMAL HIGH (ref 98–111)
Creatinine, Ser: 0.34 mg/dL — ABNORMAL LOW (ref 0.44–1.00)
GFR calc Af Amer: >60 mL/min (ref >60)
GFR calc non Af Amer: >60 mL/min (ref >60)
Glucose, Bld: 91 mg/dL (ref 70–99)
Potassium: 2.7 mmol/L — CL (ref 3.5–5.1)
Sodium: 145 mmol/L (ref 135–145)

## 2020-02-04 LAB — CBC
HCT: 25.1 % — ABNORMAL LOW (ref 36.0–46.0)
Hemoglobin: 7.6 g/dL — ABNORMAL LOW (ref 12.0–15.0)
MCH: 26.1 pg (ref 26.0–34.0)
MCHC: 30.3 g/dL (ref 30.0–36.0)
MCV: 86.3 fL (ref 80.0–100.0)
Platelets: 923 10*3/uL (ref 150–400)
RBC: 2.91 MIL/uL — ABNORMAL LOW (ref 3.87–5.11)
RDW: 15 % (ref 11.5–15.5)
WBC: 10.6 10*3/uL — ABNORMAL HIGH (ref 4.0–10.5)
nRBC: 0 % (ref 0.0–0.2)

## 2020-02-04 LAB — PROCALCITONIN: Procalcitonin: 0.43 ng/mL

## 2020-02-04 LAB — GLUCOSE, CAPILLARY
Glucose-Capillary: 108 mg/dL — ABNORMAL HIGH (ref 70–99)
Glucose-Capillary: 120 mg/dL — ABNORMAL HIGH (ref 70–99)
Glucose-Capillary: 122 mg/dL — ABNORMAL HIGH (ref 70–99)
Glucose-Capillary: 138 mg/dL — ABNORMAL HIGH (ref 70–99)
Glucose-Capillary: 91 mg/dL (ref 70–99)
Glucose-Capillary: 93 mg/dL (ref 70–99)
Glucose-Capillary: 94 mg/dL (ref 70–99)

## 2020-02-04 LAB — CULTURE, RESPIRATORY W GRAM STAIN

## 2020-02-04 LAB — PHOSPHORUS: Phosphorus: 2.9 mg/dL (ref 2.5–4.6)

## 2020-02-04 MED ORDER — MAGNESIUM SULFATE 2 GM/50ML IV SOLN
2.0000 g | Freq: Once | INTRAVENOUS | Status: AC
  Administered 2020-02-04: 2 g via INTRAVENOUS

## 2020-02-04 MED ORDER — SODIUM CHLORIDE 0.9 % IV SOLN
1.0000 g | Freq: Three times a day (TID) | INTRAVENOUS | Status: AC
  Administered 2020-02-04 – 2020-02-10 (×20): 1 g via INTRAVENOUS
  Filled 2020-02-04 (×20): qty 1

## 2020-02-04 MED ORDER — POTASSIUM CHLORIDE 10 MEQ/50ML IV SOLN
10.0000 meq | INTRAVENOUS | Status: AC
  Administered 2020-02-04 (×6): 10 meq via INTRAVENOUS
  Filled 2020-02-04 (×6): qty 50

## 2020-02-04 NOTE — Progress Notes (Signed)
Assessment & Plan: POD#17 - Sigmoid colon perforation secondary to chronic constipation and ulceration with feculent peritonitis and gross contamination of abdominal cavity -Exploratory laparotomy with partial colectomy and end colostomy5/5 Dr. Luisa Hart -wound vac MWF - restart TF's today slowly, TNA until reach goal rate  HTN Chronic constipation- miralaxBID and lactulose BIDper tube, Movantik started as well  Malnutrition- prealbumin7.9,TFs VDRF, ?VAP- extubated 5/11, reintubated 5/12.Trach5/19, plan for LTAC for weaning Fever -CT sinus with some edema as likely expected from NGT, but no infectious etiology noted. Anemia- Hgb 7.6 this AM R brachial DVT- lovenox70mg  BID  ID -zosyn 5/5>>5/14,5/19 -->5/20eraxis 5/12>>5/14, 5/19 -->5/20Rocephin 5/14 -->5/19. Cefepime 5/20--> FEN -NGT/TFs VTE -SCDs, lovenox70mg  BID Foley -d/c 5/13 Dispo - ?LTAC, CM looking into this  Telephone call to husband, Darlene Castillo, this morning at patient's request.  Reviewed clinical status and answered questions.  He expressed appreciation.        Darnell Level, MD       [facility]       Office: [phone]   Chief Complaint: Colonic perforation, sepsis  Subjective: Patient more alert, responsive.  Writing notes on pad.  Wants me to call husband.  Objective: Vital signs in last 24 hours: Temp:  [98.5 F (36.9 C)-102.3 F (39.1 C)] 98.8 F (37.1 C) (05/22 0807) Pulse Rate:  [69-109] 75 (05/22 1000) Resp:  [15-43] 19 (05/22 1000) BP: (123-162)/(71-96) 142/83 (05/22 1000) SpO2:  [98 %-100 %] 100 % (05/22 1000) FiO2 (%):  [30 %] 30 % (05/22 0802) Weight:  [63.5 kg] 63.5 kg (05/22 0456) Last BM Date: 02/04/20  Intake/Output from previous day: 05/21 0701 - 05/22 0700 In: 1966.2 [I.V.:1088.5; IV Piggyback:877.7] Out: 3165 [Urine:2250; Emesis/NG output:750; Stool:165] Intake/Output this shift: Total I/O In: 416.6 [I.V.:317.8; IV Piggyback:98.8] Out: 0     Physical Exam: HEENT - sclerae clear, mucous membranes moist Neck - trach site clear and dry Abdomen - soft, ostomy viable, small output in bag; VAC in midline intact  Lab Results:  Recent Labs    02/03/20 0500 02/04/20 0455  WBC 12.9* 10.6*  HGB 7.3* 7.6*  HCT 23.2* 25.1*  PLT 857* 923*   BMET Recent Labs    02/03/20 1439 02/04/20 0836  NA 143 145  K 4.2 2.7*  CL 104 122*  CO2 28 20*  GLUCOSE 123* 91  BUN 24* 19  CREATININE 0.75 0.34*  CALCIUM 8.8* 5.7*   PT/INR No results for input(s): LABPROT, INR in the last 72 hours. Comprehensive Metabolic Panel:    Component Value Date/Time   NA 145 02/04/2020 0836   NA 143 02/03/2020 1439   K 2.7 (LL) 02/04/2020 0836   K 4.2 02/03/2020 1439   CL 122 (H) 02/04/2020 0836   CL 104 02/03/2020 1439   CO2 20 (L) 02/04/2020 0836   CO2 28 02/03/2020 1439   BUN 19 02/04/2020 0836   BUN 24 (H) 02/03/2020 1439   CREATININE 0.34 (L) 02/04/2020 0836   CREATININE 0.75 02/03/2020 1439   GLUCOSE 91 02/04/2020 0836   GLUCOSE 123 (H) 02/03/2020 1439   CALCIUM 5.7 (LL) 02/04/2020 0836   CALCIUM 8.8 (L) 02/03/2020 1439   AST 63 (H) 02/02/2020 0403   AST 66 (H) 01/30/2020 0222   ALT 53 (H) 02/02/2020 0403   ALT 50 (H) 01/30/2020 0222   ALKPHOS 294 (H) 02/02/2020 0403   ALKPHOS 271 (H) 01/30/2020 0222   BILITOT 0.3 02/02/2020 0403   BILITOT 0.2 (L) 01/30/2020 0222   PROT 6.6  02/02/2020 0403   PROT 7.0 01/30/2020 0222   ALBUMIN 1.8 (L) 02/02/2020 0403   ALBUMIN 1.9 (L) 01/30/2020 0222    Studies/Results: CT MAXILLOFACIAL W CONTRAST  Result Date: 02/02/2020 CLINICAL DATA:  Sinusitis acute.  Fever of unknown etiology. EXAM: CT MAXILLOFACIAL WITH CONTRAST TECHNIQUE: Multidetector CT imaging of the maxillofacial structures was performed with intravenous contrast. Multiplanar CT image reconstructions were also generated. CONTRAST:  35mL OMNIPAQUE IOHEXOL 300 MG/ML  SOLN COMPARISON:  CT head 11/15/2019 FINDINGS: Osseous: No fracture  or other acute skeletal abnormality. Orbits: No orbital mass or abscess. No orbital edema. Globe normal bilaterally. Sinuses: Mucosal edema paranasal sinuses. No air-fluid level. Bilateral mastoid effusion. Middle ear clear bilaterally. Soft tissues: No soft tissue mass or abscess. Tracheostomy in good position. NG tube in the esophagus. Limited intracranial: Negative IMPRESSION: Sinus mucosal disease. Bilateral mastoid effusions. No air-fluid levels. No abscess in the neck. Electronically Signed   By: Franchot Gallo M.D.   On: 02/02/2020 13:54   DG CHEST PORT 1 VIEW  Result Date: 02/04/2020 CLINICAL DATA:  Acute respiratory failure with hypoxia EXAM: PORTABLE CHEST 1 VIEW COMPARISON:  Two days ago FINDINGS: Tracheostomy tube in place. Enteric tube at least reaches the mid stomach. Left PICC with tip at the upper cavoatrial junction. Improved aeration, possibly inferior flow pleural fluid. Bilateral atelectasis/pneumonia. No air leak or increasing cardiopericardial size IMPRESSION: 1. Improved aeration, possibly dependent flow of pleural fluid or diuresis. There is still pleural fluid and airspace disease in the lower lungs. 2. Stable hardware positioning. Electronically Signed   By: Monte Fantasia M.D.   On: 02/04/2020 07:32      Darlene Castillo 02/04/2020  Patient ID: Darlene Castillo, female   DOB: 05-31-1961, 59 y.o.   MRN: [id number]

## 2020-02-04 NOTE — Progress Notes (Signed)
NAME:  Darlene Castillo, MRN:  [id number], DOB:  Dec 10, 1960, LOS: 48 ADMISSION DATE:  01/18/2020, CONSULTATION DATE:  01/18/20 REFERRING MD:  Erroll Luna CHIEF COMPLAINT:  VDRF, septic shock  Brief History   60 y/o F admitted 5/5 with abdominal pain, constipation and one episode of vomiting. Found to have a perforated sigmoid colon with peritonitis.  To OR for exploratory laparotomy with partial colectomy and end colostomy (negative pathology), returned to ICU post-operatively on mechanical ventilation. Prolonged respiratory failure s/p tracheostomy 5/19.  Difficult to control agitation / pain.    Past Medical History  Chronic opiate use Tobacco abuse DM HTN  Significant Hospital Events   5/05 Admit, ex lap with partial colectomy and end colostomy for perf:  neg path 5/11 Extubated 5/12 Failed extubation.  Initially DNI but reversed.   5/13  Back on vent. Fever curve better. Concern for A Fib but EKG sinus tach with PAC. Down to 40% fio2 . TF on hold but getting meds via NG. On TPN. On dilaudid gtt, scheduled methadone, versed gtt, clonidine, seroquel and nicotine patch. Started on IV heparin for RUE DVT  5/14 dc versed gtt. Working on weaning sedation. Changing abx to rocephin 5/15 Methadone held at family request  5/16 Restarted methadone due to significant agitation 5/18 Agitation overnight, sedation gtt's increased 5/19 Trach.  Dilaudid stopped. Intra stomal enema  5/20 Sedation reduction  5/21 On PSV wean 10/5.  TF on hold with emesis overnight.  5/22 -> On PSV wean 10/5! Glucose range 74 - 162 Vomiting overnight, TF held  Tmax 101 / WBC 12.9  I/O 1L UOP, 1.3L stool, net event for last 24hours  Consults:  CCS PCCM  Procedures:  R IJ CVL 5/5 >> 5/6 ALine 5/5 >> 5/7  ETT 5/5 >> 5/11, replaced 5/12 >> 5/19  Trach 5/19 >> LUE PICC 5/6 >>  Significant Diagnostic Tests:   CT abdomen pelvis with contrast 01/17/2020 >> scattered groundglass opacities in the lower lungs  bilaterally, no focal opacities.  Abdominal free air.  Multiple dilated loops of bowel with stool.  Large R renal cyst.  Path 5/5 >> Perforation with acute inflammation and acute serositis. No evidence of malignancy.    [place] UE 5/13 >> RUE DVT  CT ABD / Pelvis w Contrast 5/17 >> increasingly organized apearance of LLQ & pelvic fluid with slight decrease in the deep pelvic fluid.  Slight increase in LLQ fluid. Interloop fluid in the left hemiabdomen is slightly increased overall.  Diffuse interloop fluid and mesenteric stranding is slightly diminished. Persistent small areas of loculated perihepatic fluid unchanged, largest area in the axial plane measureing 2.5 x 1.7 cm. Decreased formed stool in the colon but with slight increase in colonic distention particularly of the transverse colon.  May reflect colonic ileus.  Persistent bibasilar consolidation and pleural effusions without significant change.   CT Sinus 5/20 >> sinus mucosal edema, bilateral mastoid effusions, no air fluid levels, no abscess in neck  Micro Data:  Covid 5/5 >> negative Flu PCR 5/5 >> negative MRSA PCR 5/12 >> neg Tracheal aspirate 5/12 >> klebsiella > R ampicillin, Unasyn, I: zosyn Urine analysis 5/12 >> negative  Blood culture 5/12 >> negative  UC 5/19 >> negative  BCx2 5/18 >> Tracheal Aspirate 5/20 >>   Antimicrobials:  Zosyn 5/5 >> 5/14 Eraxis 5/12 >> 5/14 Rocephin 5/14 >> Eraxis 5/18 >> 5/20 Zosyn 5/19 >> 5/19 Cefepime 5/20 >>   MEDS   Scheduled Meds: . albuterol  2.5 mg  Nebulization Q6H  . chlorhexidine gluconate (MEDLINE KIT)  15 mL Mouth Rinse BID  . Chlorhexidine Gluconate Cloth  6 each Topical Daily  . [START ON 02/06/2020] clonazePAM  0.25 mg Oral BID  . clonazePAM  0.5 mg Per Tube BID  . clonazePAM  1 mg Per Tube BID  . cloNIDine  0.1 mg Per Tube Q6H  . enoxaparin (LOVENOX) injection  65 mg Subcutaneous Q12H  . famotidine  40 mg Per Tube QHS  . feeding supplement (PRO-STAT SUGAR FREE 64)  30  mL Per Tube Daily  . insulin aspart  2-6 Units Subcutaneous Q4H  . insulin aspart  4 Units Subcutaneous Q4H  . lactulose  30 g Per Tube BID  . mouth rinse  15 mL Mouth Rinse 10 times per day  . methadone  5 mg Intravenous Q6H  . naloxegol oxalate  12.5 mg Per NG tube Daily  . nicotine  7 mg Transdermal Daily  . oxymetazoline  1 spray Each Nare BID  . polyethylene glycol  17 g Per Tube BID  . potassium chloride  20 mEq Per Tube Once  . potassium chloride  40 mEq Per Tube BID  . QUEtiapine  50 mg Per Tube QHS  . sodium chloride  1 spray Each Nare BID  . sodium chloride flush  10-40 mL Intracatheter Q12H   Continuous Infusions: . sodium chloride Stopped (01/25/20 2356)  . acetaminophen Stopped (02/03/20 1315)  . ceFEPime (MAXIPIME) IV Stopped (02/04/20 0601)  . dexmedetomidine (PRECEDEX) IV infusion 1 mcg/kg/hr (02/04/20 0933)  . dextrose 5 % and 0.9% NaCl 50 mL/hr at 02/04/20 0800  . feeding supplement (VITAL AF 1.2 CAL) 1,000 mL (02/03/20 0827)   PRN Meds:.sodium chloride, acetaminophen, lip balm, metoprolol tartrate, olopatadine, ondansetron (ZOFRAN) IV, prochlorperazine, sodium chloride flush      Interim history/subjective:    02/04/2020 - On PSV via trach. Not on sedation gtt. Yesterday nausea with TF and TF held. Not on TPN. Not on CRRT. Making urine. Got lasix and responded 1.5L yestrerday. Mild nausea today and got compazine. Denies abd pain.   Objective   Blood pressure (!) 151/85, pulse 80, temperature 98.8 F (37.1 C), temperature source Axillary, resp. rate (!) 21, height _0  (1.549 m), weight 63.5 kg, last menstrual period 05/23/2013, SpO2 100 %.    Vent Mode: PSV;CPAP FiO2 (%):  [30 %] 30 % Set Rate:  [16 bmp] 16 bmp Vt Set:  [380 mL] 380 mL PEEP:  [5 cmH20] 5 cmH20 Pressure Support:  [10 cmH20] 10 cmH20 Plateau Pressure:  [19 cmH20-20 cmH20] 19 cmH20   Intake/Output Summary (Last 24 hours) at 02/04/2020 0933 Last data filed at 02/04/2020 5726 Gross per 24  hour  Intake 2247.06 ml  Output 3165 ml  Net -917.94 ml   Filed Weights   02/01/20 0418 02/02/20 1300 02/04/20 0456  Weight: 70.6 kg 64.4 kg 63.5 kg    Exam: General Appearance:  Looks criticall ill chronic Head:  Normocephalic, without obvious abnormality, atraumatic Eyes:  PERRL - yes, conjunctiva/corneas - muddy     Ears:  Normal external ear canals, both ears Nose:  G tube - yes Throat:  ETT TUBE - no , OG tube - no. TRACH + - healthy Neck:  Supple,  No enlargement/tenderness/nodules Lungs: Clear to auscultation bilaterally, Ventilator   Synchrony - yes on PSV Heart:  S1 and S2 normal, no murmur, CVP - no.  Pressors - no Abdomen:  Soft, no masses, no organomegaly. WOUND VAC +  Genitalia / Rectal:  Not done Extremities:  Extremities- intact Skin:  ntact in exposed areas . Sacral area - not examined Neurologic:  Sedation - oral clonidine, clonopin., methadone, seroquel, and patch nicotine -> RASS - 0 . Moves all 4s - yes. CAM-ICU - neg . Orientation - x3+.      Resolved problems  Circulatory/septic shock  Assessment & Plan:  LOS 17 days   Acute on chronic hypoxic respiratory failure requiring mechanical ventilation, complicated by Klebsiella PNA. -Status post 8 days cefepime ending 02/03/2020 s/p [facility]  02/04/2020 - > doing well on pressure support but physical deconditioning precludes toward liberation from the ventilator today  Plan --PRVC 8cc/kg as rest mode  -continue daily SBT wean as tolerated, goal for ATC  -PEEP / FiO2 to support sats >90% -minimize sedating medications as able  -VAP prevention measures -Clip trach sutures ~ 7/41  Acute Metabolic Encephalopathy, started 5/12 Chronic Pain, on oral methadone PTA Difficulties with intermittent agitation, family requests for patient to come off home medications, agitated delirium. Improved 5/17.    -02/04/2020: Resolved delirium.  Maintained on Seroquel, Klonopin, clonidine and methadone scheduled along with  nicotine patch  Plan - -minimize opioids  sedating medications and and Seroquel and Klonopin as tolerated to off -Check QTC 02/05/2020     Peritonitis 2/2 Sigmoid Colon Perforation from Chronic Constipation s/p ex lap with sigmoid resection and colostomy. Completed 10 days abx for initial peritonitis. Required enema for constipation 5/19.  02/04/2020: Having intermittent nausea and CCS has held off tube feeds.  Currently on cefepime for peritonitis as an extension from Klebsiella pneumonia treatment  Plan - -continue cefepime, D9/x.  ABX expanded 5/20 for abdominal coverage per CCS.  At this point treated for PNA. -post operative care per CCS -follow stoma output  -pain control per CCS  Right Brachial DVT -continue lovenox   Emesis -PRN zofran ordered  Moderate Malnutrition Expected prolonged ileus post- op, which is improving. -TF per CCS-currently on hold  Intermittent Fluid and Electrolyte imbalance   02/04/2020: Severely low potassium and low magnesium  Plan -Replace potassium and magnesium  Hyperglycemia -SSI   Anemia  Suspect multifactorial in setting of critical illness, recurrent phlebotomy. No acute evidence of bleeding   02/04/2020: Hemoglobin 10.6 without any bleeding  Plan -- PRBC for hgb </= 6.9gm%    - exceptions are   -  if ACS susepcted/confirmed then transfuse for hgb </= 8.0gm%,  or    -  active bleeding with hemodynamic instability, then transfuse regardless of hemoglobin value   At at all times try to transfuse 1 unit prbc as possible with exception of active hemorrhage   Intermittent Fevers Persistent intermittent fever despite abx.  DVT identified and being treated with full dose anticoagulation. Consider right chest effusion, ongoing constipation, drug fever, sinusitis. GB normal on CT imaging, less likely acalculous cholecystitis. CT sinuses discussed with radiology > appears like retained mucus in mastoid sinuses, air fluid levels expected  with NGT in place, non-specific findings.  Eraxis started 02/02/2020: Risk of drug fever   02/04/2020: Last fever 02/03/2020  Plan -abx as above -will treat with afrin for two days, nasal saline.  Could consider smaller bore NGT vs PEG but will defer this to CCS -follow cultures  -no foley  -trend PCT as needed  Mild Elevation of LFT's  -follow LFT's  -avoid hepatotoxic agents  Best practice:  Diet: NPO; TPN & TF Pain/Anxiety/Delirium protocol (if indicated): see above VAP protocol (if indicated): In place  DVT prophylaxis: SCDs and lovenox GI prophylaxis: pepcid Glucose control: SSI  Mobility: bedrest  Code Status: full code  Family Communication: Mother updated via phone 5/21 on plan of care. Husband Anthonette Lesage updated 9:42 AM 02/04/2020 - denied questions. Says he is on way up  Disposition: ICU    ATTESTATION & SIGNATURE   The patient DARIELLE HANCHER is critically ill with multiple organ systems failure and requires high complexity decision making for assessment and support, frequent evaluation and titration of therapies, application of advanced monitoring technologies and extensive interpretation of multiple databases.   Critical Care Time devoted to patient care services described in this note is  31  Minutes. This time reflects time of care of this signee Dr Brand Males. This critical care time does not reflect procedure time, or teaching time or supervisory time of PA/NP/Med student/Med Resident etc but could involve care discussion time     Dr. Brand Males, M.D., [facility].C.P Pulmonary and Critical Care Medicine Staff Physician [facility] Pager: [phone], If no answer or between  15:00h - 7:00h: call [phone]  02/04/2020 9:33 AM     LABS    PULMONARY No results for input(s): PHART, PCO2ART, PO2ART, HCO3, TCO2, O2SAT in the last 168 hours.  Invalid input(s): PCO2, PO2  CBC Recent Labs  Lab  02/02/20 0403 02/03/20 0500 02/04/20 0455  HGB 9.8* 7.3* 7.6*  HCT 31.1* 23.2* 25.1*  WBC 11.4* 12.9* 10.6*  PLT 870* 857* 923*    COAGULATION Recent Labs  Lab 02/01/20 0354  INR 1.2    CARDIAC  No results for input(s): TROPONINI in the last 168 hours. No results for input(s): PROBNP in the last 168 hours.   CHEMISTRY Recent Labs  Lab 01/30/20 0222 01/30/20 0222 01/31/20 0408 01/31/20 0408 02/01/20 0354 02/01/20 0354 02/02/20 0403 02/02/20 0403 02/03/20 0500 02/03/20 0500 02/03/20 1439 02/04/20 0836  NA 143   < > 138   < > 140  --  132*  --  144  --  143 145  K 4.1   < > 3.8   < > 3.5   < > 4.9   < > 2.7*   < > 4.2 2.7*  CL 103   < > 100   < > 103  --  96*  --  117*  --  104 122*  CO2 28   < > 27   < > 27  --  25  --  24  --  28 20*  GLUCOSE 103*   < > 128*   < > 150*  --  526*  --  83  --  123* 91  BUN 36*   < > 29*   < > 23*  --  21*  --  21*  --  24* 19  CREATININE 0.81   < > 0.62   < > 0.54  --  0.60  --  0.78  --  0.75 0.34*  CALCIUM 9.0   < > 8.4*   < > 8.5*  --  7.9*  --  7.5*  --  8.8* 5.7*  MG 2.1  --  2.0  --  2.1  --  1.9  --   --   --   --  1.3*  PHOS 5.4*  --  5.0*  --  4.0  --  4.0  --   --   --   --  2.9   < > =  values in this interval not displayed.   Estimated Creatinine Clearance: 64.7 mL/min (A) (by C-G formula based on SCr of 0.34 mg/dL (L)).   LIVER Recent Labs  Lab 01/30/20 0222 02/01/20 0354 02/02/20 0403  AST 66*  --  63*  ALT 50*  --  53*  ALKPHOS 271*  --  294*  BILITOT 0.2*  --  0.3  PROT 7.0  --  6.6  ALBUMIN 1.9*  --  1.8*  INR  --  1.2  --      INFECTIOUS Recent Labs  Lab 02/02/20 0833 02/03/20 0500 02/04/20 0455  PROCALCITON 1.07 0.63 0.43     ENDOCRINE CBG (last 3)  Recent Labs    02/04/20 0024 02/04/20 0420 02/04/20 0804  GLUCAP 138* 108* 120*         IMAGING x48h  - image(s) personally visualized  -   highlighted in bold CT MAXILLOFACIAL W CONTRAST  Result Date: 02/02/2020 CLINICAL DATA:   Sinusitis acute.  Fever of unknown etiology. EXAM: CT MAXILLOFACIAL WITH CONTRAST TECHNIQUE: Multidetector CT imaging of the maxillofacial structures was performed with intravenous contrast. Multiplanar CT image reconstructions were also generated. CONTRAST:  23m OMNIPAQUE IOHEXOL 300 MG/ML  SOLN COMPARISON:  CT head 11/15/2019 FINDINGS: Osseous: No fracture or other acute skeletal abnormality. Orbits: No orbital mass or abscess. No orbital edema. Globe normal bilaterally. Sinuses: Mucosal edema paranasal sinuses. No air-fluid level. Bilateral mastoid effusion. Middle ear clear bilaterally. Soft tissues: No soft tissue mass or abscess. Tracheostomy in good position. NG tube in the esophagus. Limited intracranial: Negative IMPRESSION: Sinus mucosal disease. Bilateral mastoid effusions. No air-fluid levels. No abscess in the neck. Electronically Signed   By: CFranchot GalloM.D.   On: 02/02/2020 13:54   DG CHEST PORT 1 VIEW  Result Date: 02/04/2020 CLINICAL DATA:  Acute respiratory failure with hypoxia EXAM: PORTABLE CHEST 1 VIEW COMPARISON:  Two days ago FINDINGS: Tracheostomy tube in place. Enteric tube at least reaches the mid stomach. Left PICC with tip at the upper cavoatrial junction. Improved aeration, possibly inferior flow pleural fluid. Bilateral atelectasis/pneumonia. No air leak or increasing cardiopericardial size IMPRESSION: 1. Improved aeration, possibly dependent flow of pleural fluid or diuresis. There is still pleural fluid and airspace disease in the lower lungs. 2. Stable hardware positioning. Electronically Signed   By: JMonte FantasiaM.D.   On: 02/04/2020 07:32   /

## 2020-02-04 NOTE — Progress Notes (Signed)
CRITICAL VALUE ALERT  Critical Value:  Platelets 921K  Date & Time Notied:  02/04/20 0610 Provider Notified: e link Orders Received/Actions taken: continue to monitor

## 2020-02-05 LAB — GLUCOSE, CAPILLARY
Glucose-Capillary: 105 mg/dL — ABNORMAL HIGH (ref 70–99)
Glucose-Capillary: 105 mg/dL — ABNORMAL HIGH (ref 70–99)
Glucose-Capillary: 130 mg/dL — ABNORMAL HIGH (ref 70–99)
Glucose-Capillary: 132 mg/dL — ABNORMAL HIGH (ref 70–99)
Glucose-Capillary: 133 mg/dL — ABNORMAL HIGH (ref 70–99)
Glucose-Capillary: 55 mg/dL — ABNORMAL LOW (ref 70–99)
Glucose-Capillary: 67 mg/dL — ABNORMAL LOW (ref 70–99)
Glucose-Capillary: 69 mg/dL — ABNORMAL LOW (ref 70–99)

## 2020-02-05 LAB — CBC
HCT: 23 % — ABNORMAL LOW (ref 36.0–46.0)
HCT: 32.6 % — ABNORMAL LOW (ref 36.0–46.0)
Hemoglobin: 6.9 g/dL — CL (ref 12.0–15.0)
Hemoglobin: 10 g/dL — ABNORMAL LOW (ref 12.0–15.0)
MCH: 26.2 pg (ref 26.0–34.0)
MCH: 26.6 pg (ref 26.0–34.0)
MCHC: 30 g/dL (ref 30.0–36.0)
MCHC: 30.7 g/dL (ref 30.0–36.0)
MCV: 87.5 fL (ref 80.0–100.0)
MCV: 86.7 fL (ref 80.0–100.0)
Platelets: 759 10*3/uL — ABNORMAL HIGH (ref 150–400)
Platelets: 922 10*3/uL (ref 150–400)
RBC: 2.63 MIL/uL — ABNORMAL LOW (ref 3.87–5.11)
RBC: 3.76 MIL/uL — ABNORMAL LOW (ref 3.87–5.11)
RDW: 15.2 % (ref 11.5–15.5)
RDW: 14.9 % (ref 11.5–15.5)
WBC: 9.2 10*3/uL (ref 4.0–10.5)
WBC: 12.2 10*3/uL — ABNORMAL HIGH (ref 4.0–10.5)
nRBC: 0 % (ref 0.0–0.2)
nRBC: 0 % (ref 0.0–0.2)

## 2020-02-05 LAB — TROPONIN I (HIGH SENSITIVITY)
Troponin I (High Sensitivity): 26 ng/L — ABNORMAL HIGH (ref <18)
Troponin I (High Sensitivity): 27 ng/L — ABNORMAL HIGH (ref <18)

## 2020-02-05 LAB — HEPATIC FUNCTION PANEL
ALT: 31 U/L (ref 0–44)
AST: 19 U/L (ref 15–41)
Albumin: 1.8 g/dL — ABNORMAL LOW (ref 3.5–5.0)
Alkaline Phosphatase: 149 U/L — ABNORMAL HIGH (ref 38–126)
Bilirubin, Direct: <0.1 mg/dL (ref 0.0–0.2)
Total Bilirubin: 0.2 mg/dL — ABNORMAL LOW (ref 0.3–1.2)
Total Protein: 6.2 g/dL — ABNORMAL LOW (ref 6.5–8.1)

## 2020-02-05 LAB — BASIC METABOLIC PANEL
Anion gap: 10 (ref 5–15)
BUN: 22 mg/dL — ABNORMAL HIGH (ref 6–20)
CO2: 26 mmol/L (ref 22–32)
Calcium: 8.8 mg/dL — ABNORMAL LOW (ref 8.9–10.3)
Chloride: 107 mmol/L (ref 98–111)
Creatinine, Ser: 0.59 mg/dL (ref 0.44–1.00)
GFR calc Af Amer: >60 mL/min (ref >60)
GFR calc non Af Amer: >60 mL/min (ref >60)
Glucose, Bld: 115 mg/dL — ABNORMAL HIGH (ref 70–99)
Potassium: 3.6 mmol/L (ref 3.5–5.1)
Sodium: 143 mmol/L (ref 135–145)

## 2020-02-05 LAB — CULTURE, BLOOD (ROUTINE X 2)
Culture: NO GROWTH
Culture: NO GROWTH
Special Requests: ADEQUATE

## 2020-02-05 LAB — MAGNESIUM: Magnesium: 2 mg/dL (ref 1.7–2.4)

## 2020-02-05 LAB — PHOSPHORUS: Phosphorus: 3.3 mg/dL (ref 2.5–4.6)

## 2020-02-05 LAB — PREPARE RBC (CROSSMATCH)

## 2020-02-05 MED ORDER — SODIUM CHLORIDE 0.9% IV SOLUTION: Freq: Once | INTRAVENOUS | Status: DC

## 2020-02-05 MED ORDER — HYDROCODONE-ACETAMINOPHEN 7.5-325 MG/15ML PO SOLN
10.0000 mL | ORAL | Status: DC | PRN
  Administered 2020-02-05 – 2020-02-10 (×18): 10 mL via ORAL
  Administered 2020-02-10: 5 mL via ORAL
  Administered 2020-02-10 – 2020-02-16 (×8): 10 mL via ORAL
  Filled 2020-02-05 (×28): qty 15

## 2020-02-05 MED ORDER — VITAL AF 1.2 CAL PO LIQD
1000.0000 mL | ORAL | Status: DC
  Administered 2020-02-05 – 2020-02-06 (×2): 1000 mL

## 2020-02-05 MED ORDER — ALUM & MAG HYDROXIDE-SIMETH 200-200-20 MG/5ML PO SUSP
30.0000 mL | Freq: Four times a day (QID) | ORAL | Status: DC | PRN
  Administered 2020-02-05: 30 mL via ORAL
  Filled 2020-02-05: qty 30

## 2020-02-05 NOTE — Progress Notes (Signed)
eLink Physician-Brief Progress Note Patient Name: Darlene Castillo DOB: June 23, 1961 MRN: [id number]   Date of Service  02/05/2020  HPI/Events of Note  Anemia - Hgb = 6.9 and 2. EKG read as sinus rhythm with Premature atrial complexes with Abberant conduction. Otherwise normal ECG. Nursing concerned about ST elevation in II, II, F, V5 and V6.   eICU Interventions  Plan: 1. Transfuse 1 unit PRBC now. 2. Cycle tropinin.     Intervention Category Major Interventions: Other:  [name] 02/05/2020, 6:23 AM

## 2020-02-05 NOTE — Progress Notes (Signed)
CRITICAL VALUE ALERT  Critical Value:  hgb 6.9  Date & Time Notied:  02/05/20 0600  Provider Notified: e-link

## 2020-02-05 NOTE — Progress Notes (Signed)
Assessment & Plan: POD#18 - Sigmoid colon perforation secondary to chronic constipation and ulceration with feculent peritonitis and gross contamination of abdominal cavity -Exploratory laparotomy with partial colectomy and end colostomy5/5 Dr. Luisa Hart -wound vac MWF - slowly advancing TF's - increase to 30cc per hour today  Hypokalemia - received runs after 2.7 level - check labs this afternoon following transfusion Hypomagnesemia - received MgSO4 - check labs this afternoon following transfusion  Chronic constipation- miralaxBID and lactulose BIDper tube, Movantik started as well Malnutrition- prealbumin7.9,TFs VDRF, ?VAP- trach 5/19; weaning last two days; may have trach collar trial today per respiratory Anemia- Hgb 6.9 this AM - transfusion ordered by CCM - check CBC this afternoon R brachial DVT- lovenox70mg  BID  ID -zosyn 5/5>>5/14,5/19 -->5/20eraxis 5/12>>5/14, 5/19 -->5/20Rocephin 5/14 -->5/19. Cefepime 5/20--> FEN -TFs VTE -SCDs, lovenox70mg  BID Dispo - ?LTAC, per care management team        Darnell Level, MD       [facility]       Office: [phone]   Chief Complaint: Perforated colon  Subjective: Patient in bed, appears comfortable.  No complaints.  Respiratory in room.  Weaning vent.  Tolerated TF's at 20cc per hour overnight.  Objective: Vital signs in last 24 hours: Temp:  [98.2 F (36.8 C)-99.2 F (37.3 C)] 98.4 F (36.9 C) (05/23 0400) Pulse Rate:  [64-80] 68 (05/23 0700) Resp:  [13-24] 19 (05/23 0700) BP: (111-157)/(64-93) 126/77 (05/23 0700) SpO2:  [94 %-100 %] 100 % (05/23 0700) FiO2 (%):  [30 %] 30 % (05/23 0734) Weight:  [65.3 kg] 65.3 kg (05/23 0500) Last BM Date: 02/03/22  Intake/Output from previous day: 05/22 0701 - 05/23 0700 In: 2455.1 [I.V.:1436.9; NG/GT:230; IV Piggyback:788.2] Out: 1450 [Urine:1000; Emesis/NG output:450] Intake/Output this shift: No intake/output data  recorded.  Physical Exam: HEENT - sclerae clear, mucous membranes moist Neck - trach site clear Chest - coarse bilaterally Cor - RRR Abdomen - protuberant; BS present; ostomy viable, small liquid in bag; VAC intact Neuro - alert & oriented, no focal deficits  Lab Results:  Recent Labs    02/04/20 0455 02/05/20 0511  WBC 10.6* 9.2  HGB 7.6* 6.9*  HCT 25.1* 23.0*  PLT 923* 759*   BMET Recent Labs    02/03/20 1439 02/04/20 0836  NA 143 145  K 4.2 2.7*  CL 104 122*  CO2 28 20*  GLUCOSE 123* 91  BUN 24* 19  CREATININE 0.75 0.34*  CALCIUM 8.8* 5.7*   PT/INR No results for input(s): LABPROT, INR in the last 72 hours. Comprehensive Metabolic Panel:    Component Value Date/Time   NA 145 02/04/2020 0836   NA 143 02/03/2020 1439   K 2.7 (LL) 02/04/2020 0836   K 4.2 02/03/2020 1439   CL 122 (H) 02/04/2020 0836   CL 104 02/03/2020 1439   CO2 20 (L) 02/04/2020 0836   CO2 28 02/03/2020 1439   BUN 19 02/04/2020 0836   BUN 24 (H) 02/03/2020 1439   CREATININE 0.34 (L) 02/04/2020 0836   CREATININE 0.75 02/03/2020 1439   GLUCOSE 91 02/04/2020 0836   GLUCOSE 123 (H) 02/03/2020 1439   CALCIUM 5.7 (LL) 02/04/2020 0836   CALCIUM 8.8 (L) 02/03/2020 1439   AST 19 02/05/2020 0511   AST 63 (H) 02/02/2020 0403   ALT 31 02/05/2020 0511   ALT 53 (H) 02/02/2020 0403   ALKPHOS 149 (H) 02/05/2020 0511   ALKPHOS 294 (H) 02/02/2020 0403   BILITOT 0.2 (L) 02/05/2020 8127  BILITOT 0.3 02/02/2020 0403   PROT 6.2 (L) 02/05/2020 0511   PROT 6.6 02/02/2020 0403   ALBUMIN 1.8 (L) 02/05/2020 0511   ALBUMIN 1.8 (L) 02/02/2020 0403    Studies/Results: DG CHEST PORT 1 VIEW  Result Date: 02/04/2020 CLINICAL DATA:  Acute respiratory failure with hypoxia EXAM: PORTABLE CHEST 1 VIEW COMPARISON:  Two days ago FINDINGS: Tracheostomy tube in place. Enteric tube at least reaches the mid stomach. Left PICC with tip at the upper cavoatrial junction. Improved aeration, possibly inferior flow pleural  fluid. Bilateral atelectasis/pneumonia. No air leak or increasing cardiopericardial size IMPRESSION: 1. Improved aeration, possibly dependent flow of pleural fluid or diuresis. There is still pleural fluid and airspace disease in the lower lungs. 2. Stable hardware positioning. Electronically Signed   By: Monte Fantasia M.D.   On: 02/04/2020 07:32      Armandina Gemma 02/05/2020  Patient ID: Darlene Castillo, female   DOB: 09/17/1960, 59 y.o.   MRN: [id number]

## 2020-02-05 NOTE — Progress Notes (Signed)
NAME:  Darlene Castillo, MRN:  [id number], DOB:  1961/08/10, LOS: 18 ADMISSION DATE:  01/18/2020, CONSULTATION DATE:  01/18/20 REFERRING MD:  Harriette Bouillon CHIEF COMPLAINT:  VDRF, septic shock  Brief History   59 y/o F admitted 5/5 with abdominal pain, constipation and one episode of vomiting. Found to have a perforated sigmoid colon with peritonitis.  To OR for exploratory laparotomy with partial colectomy and end colostomy (negative pathology), returned to ICU post-operatively on mechanical ventilation. Prolonged respiratory failure s/p tracheostomy 5/19.  Difficult to control agitation / pain.    Past Medical History  Chronic opiate use Tobacco abuse DM HTN  Significant Hospital Events   5/05 Admit, ex lap with partial colectomy and end colostomy for perf:  neg path 5/11 Extubated 5/12 Failed extubation.  Initially DNI but reversed.   5/13  Back on vent. Fever curve better. Concern for A Fib but EKG sinus tach with PAC. Down to 40% fio2 . TF on hold but getting meds via NG. On TPN. On dilaudid gtt, scheduled methadone, versed gtt, clonidine, seroquel and nicotine patch. Started on IV heparin for RUE DVT  5/14 dc versed gtt. Working on weaning sedation. Changing abx to rocephin 5/15 Methadone held at family request  5/16 Restarted methadone due to significant agitation 5/18 Agitation overnight, sedation gtt's increased 5/19 Trach.  Dilaudid stopped. Intra stomal enema  5/20 Sedation reduction  5/21 On PSV wean 10/5.  TF on hold with emesis overnight.  5/22 -> On PSV wean 10/5! Glucose range 74 - 162 Vomiting overnight, TF held  Tmax 101 / WBC 12.9  I/O 1L UOP, 1.3L stool, net event for last 24hours  02/05/2020:On PSV via trach. Not on sedation gtt. Yesterday nausea with TF and TF held. Not on TPN. Not on CRRT. Making urine. Got lasix and responded 1.5L yestrerday. Mild nausea today and got compazine. Denies abd pain.   Consults:  CCS PCCM  Procedures:  R IJ CVL 5/5 >> 5/6 ALine  5/5 >> 5/7  ETT 5/5 >> 5/11, replaced 5/12 >> 5/19  Trach 5/19 >> LUE PICC 5/6 >>  Significant Diagnostic Tests:   CT abdomen pelvis with contrast 01/17/2020 >> scattered groundglass opacities in the lower lungs bilaterally, no focal opacities.  Abdominal free air.  Multiple dilated loops of bowel with stool.  Large R renal cyst.  Path 5/5 >> Perforation with acute inflammation and acute serositis. No evidence of malignancy.    [place] UE 5/13 >> RUE DVT  CT ABD / Pelvis w Contrast 5/17 >> increasingly organized apearance of LLQ & pelvic fluid with slight decrease in the deep pelvic fluid.  Slight increase in LLQ fluid. Interloop fluid in the left hemiabdomen is slightly increased overall.  Diffuse interloop fluid and mesenteric stranding is slightly diminished. Persistent small areas of loculated perihepatic fluid unchanged, largest area in the axial plane measureing 2.5 x 1.7 cm. Decreased formed stool in the colon but with slight increase in colonic distention particularly of the transverse colon.  May reflect colonic ileus.  Persistent bibasilar consolidation and pleural effusions without significant change.   CT Sinus 5/20 >> sinus mucosal edema, bilateral mastoid effusions, no air fluid levels, no abscess in neck  Micro Data:  Covid 5/5 >> negative Flu PCR 5/5 >> negative MRSA PCR 5/12 >> neg Tracheal aspirate 5/12 >> klebsiella > R ampicillin, Unasyn, I: zosyn Urine analysis 5/12 >> negative  Blood culture 5/12 >> negative  UC 5/19 >> negative  BCx2 5/18 >> Tracheal Aspirate  5/20 >> pseduomonas -intermediate sensitive to cefepime  Antimicrobials:  Zosyn 5/5 >> 5/14 Eraxis 5/12 >> 5/14 Rocephin 5/14 >> Eraxis 5/18 >> 5/20 Zosyn 5/19 >> 5/19 Cefepime 5/20 >> 02/04/2020 [Klebsiella and tracheal aspirate] Merrem 02/05/2020 [Pseudomonas and tracheal aspirate] >>    Interim history/subjective:    02/05/2020 -grew Pseudomonas from the sputum from 05/04/2020.  Yesterday antibiotics  changed to [facility] completing 1 unit PRBC for hemoglobin less than 7 g% overnight for central [place] surgery tolerated tube feeds at 20 cc/h overnight.  Plan is to increat to 30cc/h. Bowel sounds + per RN. Doing PSV. EKG Qtc 387 msec  Objective   Blood pressure (!) 153/78, pulse (!) 124, temperature 100 F (37.8 C), temperature source Oral, resp. rate (!) 25, height 5\' 1"  (1.549 m), weight 65.3 kg, last menstrual period 05/23/2013, SpO2 97 %.    Vent Mode: PSV;CPAP FiO2 (%):  [30 %] 30 % Set Rate:  [16 bmp] 16 bmp Vt Set:  [380 mL] 380 mL PEEP:  [5 cmH20] 5 cmH20 Pressure Support:  [10 cmH20] 10 cmH20 Plateau Pressure:  [17 cmH20-20 cmH20] 20 cmH20   Intake/Output Summary (Last 24 hours) at 02/05/2020 1415 Last data filed at 02/05/2020 0644 Gross per 24 hour  Intake 1768.22 ml  Output 815 ml  Net 953.22 ml   Filed Weights   02/02/20 1300 02/04/20 0456 02/05/20 0500  Weight: 64.4 kg 63.5 kg 65.3 kg    Exam:   Resolved problems  Circulatory/septic shock  Assessment & Plan:  LOS 18 days   Acute on chronic hypoxic respiratory failure requiring mechanical ventilation, complicated by Klebsiella PNA. -Status post 8 days cefepime ending 02/03/2020 s/p [facility]  02/05/2020 - > doing well on pressure support but physical deconditioning precludes toward liberation from the ventilator today  Plan --PRVC 8cc/kg as rest mode  -continue daily SBT wean as tolerated, goal for ATC when possible  -PEEP / FiO2 to support sats >90% -minimize sedating medications as able  -VAP prevention measures -Clip trach sutures ~ 02/10/20  Acute Metabolic Encephalopathy, started 5/12 Chronic Pain, on oral methadone PTA Difficulties with intermittent agitation, family requests for patient to come off home medications, agitated delirium. Improved 5/17.    -02/05/2020: Resolved delirium.  Maintained on Seroquel, Klonopin, clonidine and methadone scheduled along with nicotine patch  Plan - -minimize opioids   sedating medications and and Seroquel and Klonopin as tolerated to off    Intermittent Fevers - course complicated by VAP Persistent intermittent fever despite abx.  DVT identified and being treated with full dose anticoagulation. Consider right chest effusion, ongoing constipation, drug fever, sinusitis. GB normal on CT imaging, less likely acalculous cholecystitis. CT sinuses discussed with radiology > appears like retained mucus in mastoid sinuses, air fluid levels expected with NGT in place, non-specific findings.  Eraxis started 02/02/2020: Risk of drug fever   5/23 - fever seems bettter and WBC better after starting merrem 5/22 for pseudomonas VAP  Plan -abx as above -will treat with afrin for two days, nasal saline.  Could consider smaller bore NGT vs PEG but will defer this to CCS -follow cultures  -no foley  -trend PCT as needed   Peritonitis 2/2 Sigmoid Colon Perforation from Chronic Constipation s/p ex lap with sigmoid resection and colostomy. Completed 10 days abx for initial peritonitis. Required enema for constipation 5/19.  5/23 - tolerating TF at 20cc/h without nause and vomit  Plan -post operative care per CCS -follow stoma output  -pain control per CCS  Right Brachial DVT -continue lovenox   Emesis -PRN zofran ordered  Moderate Malnutrition Expected prolonged ileus post- op, which is improving. -TF per CCS-currently on hold  Intermittent Fluid and Electrolyte imbalance   02/05/2020: Severely low potassium and low magnesium repleted yesterday.  Labs today pending  Plan -Check bmet mag and Raliegh Ip 02/05/2020  Hyperglycemia -SSI   Anemia  Suspect multifactorial in setting of critical illness, recurrent phlebotomy. No acute evidence of bleeding   02/05/2020 -completing 1 unit of PRBC for hemoglobin less than 7 g%  Plan -- PRBC for hgb </= 6.9gm%    - exceptions are   -  if ACS susepcted/confirmed then transfuse for hgb </= 8.0gm%,  or    -  active  bleeding with hemodynamic instability, then transfuse regardless of hemoglobin value   At at all times try to transfuse 1 unit prbc as possible with exception of active hemorrhage  Mild Elevation of LFT's  -follow LFT's  -avoid hepatotoxic agents  Best practice:  Diet: NPO; TPN & TF Pain/Anxiety/Delirium protocol (if indicated): see above VAP protocol (if indicated): In place DVT prophylaxis: SCDs and lovenox GI prophylaxis: pepcid Glucose control: SSI  Mobility: bedrest  Code Status: full code  Family Communication: Mother updated via phone 5/21 on plan of care. Husband Andjela Wickes updated 02/04/20 - denied questions.  For 5/23 - per CCS primary team  Disposition: ICU    SIGNATURE    Dr. Kalman Shan, M.D., F.C.C.P,  Pulmonary and Critical Care Medicine Staff Physician, [facility] Center Director - Interstitial Lung Disease  Program  Pulmonary Fibrosis [facility] at [facility] [place], [place], [address]  Pager: [phone], If no answer or between  15:00h - 7:00h: call [phone] Telephone: [phone]  2:39 PM 02/05/2020     LABS    PULMONARY No results for input(s): PHART, PCO2ART, PO2ART, HCO3, TCO2, O2SAT in the last 168 hours.  Invalid input(s): PCO2, PO2  CBC Recent Labs  Lab 02/03/20 0500 02/04/20 0455 02/05/20 0511  HGB 7.3* 7.6* 6.9*  HCT 23.2* 25.1* 23.0*  WBC 12.9* 10.6* 9.2  PLT 857* 923* 759*    COAGULATION Recent Labs  Lab 02/01/20 0354  INR 1.2    CARDIAC  No results for input(s): TROPONINI in the last 168 hours. No results for input(s): PROBNP in the last 168 hours.   CHEMISTRY Recent Labs  Lab 01/30/20 0222 01/30/20 0222 01/31/20 0408 01/31/20 0408 02/01/20 0354 02/01/20 0354 02/02/20 0403 02/02/20 0403 02/03/20 0500 02/03/20 0500 02/03/20 1439 02/04/20 0836  NA 143   < > 138   < > 140  --  132*  --  144  --  143 145  K 4.1   < > 3.8   < > 3.5   < > 4.9   < > 2.7*   <  > 4.2 2.7*  CL 103   < > 100   < > 103  --  96*  --  117*  --  104 122*  CO2 28   < > 27   < > 27  --  25  --  24  --  28 20*  GLUCOSE 103*   < > 128*   < > 150*  --  526*  --  83  --  123* 91  BUN 36*   < > 29*   < > 23*  --  21*  --  21*  --  24* 19  CREATININE 0.81   < > 0.62   < > 0.54  --  0.60  --  0.78  --  0.75 0.34*  CALCIUM 9.0   < > 8.4*   < > 8.5*  --  7.9*  --  7.5*  --  8.8* 5.7*  MG 2.1  --  2.0  --  2.1  --  1.9  --   --   --   --  1.3*  PHOS 5.4*  --  5.0*  --  4.0  --  4.0  --   --   --   --  2.9   < > = values in this interval not displayed.   Estimated Creatinine Clearance: 65.5 mL/min (A) (by C-G formula based on SCr of 0.34 mg/dL (L)).   LIVER Recent Labs  Lab 01/30/20 0222 02/01/20 0354 02/02/20 0403 02/05/20 0511  AST 66*  --  63* 19  ALT 50*  --  53* 31  ALKPHOS 271*  --  294* 149*  BILITOT 0.2*  --  0.3 0.2*  PROT 7.0  --  6.6 6.2*  ALBUMIN 1.9*  --  1.8* 1.8*  INR  --  1.2  --   --      INFECTIOUS Recent Labs  Lab 02/02/20 0833 02/03/20 0500 02/04/20 0455  PROCALCITON 1.07 0.63 0.43     ENDOCRINE CBG (last 3)  Recent Labs    02/05/20 0327 02/05/20 0741 02/05/20 1128  GLUCAP 132* 69* 105*         IMAGING x48h  - image(s) personally visualized  -   highlighted in bold DG CHEST PORT 1 VIEW  Result Date: 02/04/2020 CLINICAL DATA:  Acute respiratory failure with hypoxia EXAM: PORTABLE CHEST 1 VIEW COMPARISON:  Two days ago FINDINGS: Tracheostomy tube in place. Enteric tube at least reaches the mid stomach. Left PICC with tip at the upper cavoatrial junction. Improved aeration, possibly inferior flow pleural fluid. Bilateral atelectasis/pneumonia. No air leak or increasing cardiopericardial size IMPRESSION: 1. Improved aeration, possibly dependent flow of pleural fluid or diuresis. There is still pleural fluid and airspace disease in the lower lungs. 2. Stable hardware positioning. Electronically Signed   By: Monte Fantasia M.D.   On:  02/04/2020 07:32   /

## 2020-02-06 DIAGNOSIS — Z9189 Other specified personal risk factors, not elsewhere classified: Secondary | ICD-10-CM

## 2020-02-06 LAB — TYPE AND SCREEN
ABO/RH(D): O POS
Antibody Screen: NEGATIVE
Unit division: 0

## 2020-02-06 LAB — GLUCOSE, CAPILLARY
Glucose-Capillary: 122 mg/dL — ABNORMAL HIGH (ref 70–99)
Glucose-Capillary: 122 mg/dL — ABNORMAL HIGH (ref 70–99)
Glucose-Capillary: 128 mg/dL — ABNORMAL HIGH (ref 70–99)
Glucose-Capillary: 128 mg/dL — ABNORMAL HIGH (ref 70–99)
Glucose-Capillary: 140 mg/dL — ABNORMAL HIGH (ref 70–99)
Glucose-Capillary: 69 mg/dL — ABNORMAL LOW (ref 70–99)
Glucose-Capillary: 89 mg/dL (ref 70–99)

## 2020-02-06 LAB — COMPREHENSIVE METABOLIC PANEL
ALT: 31 U/L (ref 0–44)
AST: 21 U/L (ref 15–41)
Albumin: 2 g/dL — ABNORMAL LOW (ref 3.5–5.0)
Alkaline Phosphatase: 146 U/L — ABNORMAL HIGH (ref 38–126)
Anion gap: 8 (ref 5–15)
BUN: 19 mg/dL (ref 6–20)
CO2: 26 mmol/L (ref 22–32)
Calcium: 8.6 mg/dL — ABNORMAL LOW (ref 8.9–10.3)
Chloride: 106 mmol/L (ref 98–111)
Creatinine, Ser: 0.63 mg/dL (ref 0.44–1.00)
GFR calc Af Amer: >60 mL/min (ref >60)
GFR calc non Af Amer: >60 mL/min (ref >60)
Glucose, Bld: 149 mg/dL — ABNORMAL HIGH (ref 70–99)
Potassium: 3.6 mmol/L (ref 3.5–5.1)
Sodium: 140 mmol/L (ref 135–145)
Total Bilirubin: <0.1 mg/dL — ABNORMAL LOW (ref 0.3–1.2)
Total Protein: 6.8 g/dL (ref 6.5–8.1)

## 2020-02-06 LAB — DIFFERENTIAL
Abs Immature Granulocytes: 0.06 10*3/uL (ref 0.00–0.07)
Basophils Absolute: 0 10*3/uL (ref 0.0–0.1)
Basophils Relative: 0 %
Eosinophils Absolute: 0.2 10*3/uL (ref 0.0–0.5)
Eosinophils Relative: 1 %
Immature Granulocytes: 1 %
Lymphocytes Relative: 16 %
Lymphs Abs: 1.9 10*3/uL (ref 0.7–4.0)
Monocytes Absolute: 1 10*3/uL (ref 0.1–1.0)
Monocytes Relative: 8 %
Neutro Abs: 8.9 10*3/uL — ABNORMAL HIGH (ref 1.7–7.7)
Neutrophils Relative %: 74 %

## 2020-02-06 LAB — MAGNESIUM: Magnesium: 1.9 mg/dL (ref 1.7–2.4)

## 2020-02-06 LAB — CBC
HCT: 31.5 % — ABNORMAL LOW (ref 36.0–46.0)
Hemoglobin: 9.7 g/dL — ABNORMAL LOW (ref 12.0–15.0)
MCH: 26.1 pg (ref 26.0–34.0)
MCHC: 30.8 g/dL (ref 30.0–36.0)
MCV: 84.9 fL (ref 80.0–100.0)
Platelets: 884 10*3/uL — ABNORMAL HIGH (ref 150–400)
RBC: 3.71 MIL/uL — ABNORMAL LOW (ref 3.87–5.11)
RDW: 15 % (ref 11.5–15.5)
WBC: 12 10*3/uL — ABNORMAL HIGH (ref 4.0–10.5)
nRBC: 0 % (ref 0.0–0.2)

## 2020-02-06 LAB — BPAM RBC
Blood Product Expiration Date: 202106232359
ISSUE DATE / TIME: 202105231120
Unit Type and Rh: 5100

## 2020-02-06 LAB — PREALBUMIN: Prealbumin: 14.5 mg/dL — ABNORMAL LOW (ref 18–38)

## 2020-02-06 LAB — PHOSPHORUS: Phosphorus: 3.5 mg/dL (ref 2.5–4.6)

## 2020-02-06 LAB — TRIGLYCERIDES: Triglycerides: 185 mg/dL — ABNORMAL HIGH (ref <150)

## 2020-02-06 MED ORDER — POTASSIUM CHLORIDE 20 MEQ/15ML (10%) PO SOLN
40.0000 meq | Freq: Once | ORAL | Status: AC
  Administered 2020-02-06: 40 meq
  Filled 2020-02-06: qty 30

## 2020-02-06 MED ORDER — INSULIN ASPART 100 UNIT/ML ~~LOC~~ SOLN
0.0000 [IU] | SUBCUTANEOUS | Status: DC
  Administered 2020-02-07 – 2020-02-08 (×2): 1 [IU] via SUBCUTANEOUS

## 2020-02-06 MED ORDER — DEXTROSE 50 % IV SOLN
INTRAVENOUS | Status: AC
  Filled 2020-02-06: qty 50

## 2020-02-06 MED ORDER — INSULIN ASPART 100 UNIT/ML ~~LOC~~ SOLN
2.0000 [IU] | SUBCUTANEOUS | Status: DC
  Administered 2020-02-06 – 2020-02-09 (×11): 2 [IU] via SUBCUTANEOUS

## 2020-02-06 MED ORDER — DEXTROSE 50 % IV SOLN
12.5000 g | INTRAVENOUS | Status: AC
  Administered 2020-02-06: 12.5 g via INTRAVENOUS

## 2020-02-06 MED ORDER — PRO-STAT SUGAR FREE PO LIQD
30.0000 mL | Freq: Two times a day (BID) | ORAL | Status: DC
  Administered 2020-02-06 – 2020-02-09 (×7): 30 mL
  Filled 2020-02-06 (×7): qty 30

## 2020-02-06 NOTE — Progress Notes (Addendum)
Patient ID: Darlene Castillo, female   DOB: 1960/12/03, 59 y.o.   MRN: [id number]    19 Days Post-Op  Subjective:  Up in chair weaning 8/5 on trach!  Having slight nausea as best as I can tell.  Ostomy working very well with liquid stool. Denies much abdominal pain.  Fevers decreased to less than 101.  ROS: unable, on vent still  Objective: Vital signs in last 24 hours: Temp:  [98.3 F (36.8 C)-100.9 F (38.3 C)] 100.5 F (38.1 C) (05/24 0800) Pulse Rate:  [73-124] 88 (05/24 0800) Resp:  [16-31] 31 (05/24 0800) BP: (119-170)/(69-97) 146/74 (05/24 0400) SpO2:  [97 %-100 %] 100 % (05/24 0800) FiO2 (%):  [30 %] 30 % (05/24 0753) Weight:  [65.5 kg] 65.5 kg (05/24 0428) Last BM Date: 02/03/22  Intake/Output from previous day: 05/23 0701 - 05/24 0700 In: 2217.8 [I.V.:973.2; Blood:364; NG/GT:584.5; IV Piggyback:296.1] Out: 1850 [Urine:1050; Stool:800] Intake/Output this shift: Total I/O In: -  Out: 150 [Stool:150]  PE: Gen: sitting up in a chair in NAD Heart: regular Lungs: CTAB, trach in place, weaning 8/5 Abd: soft, still with some mild distention, colostomy working great and currently full of air and liquid stool.  NGT in place with TFs running at 30cc  Lab Results:  Recent Labs    02/05/20 1636 02/06/20 0300  WBC 12.2* 12.0*  HGB 10.0* 9.7*  HCT 32.6* 31.5*  PLT 922* 884*   BMET Recent Labs    02/05/20 1636 02/06/20 0300  NA 143 140  K 3.6 3.6  CL 107 106  CO2 26 26  GLUCOSE 115* 149*  BUN 22* 19  CREATININE 0.59 0.63  CALCIUM 8.8* 8.6*   PT/INR No results for input(s): LABPROT, INR in the last 72 hours. CMP     Component Value Date/Time   NA 140 02/06/2020 0300   K 3.6 02/06/2020 0300   CL 106 02/06/2020 0300   CO2 26 02/06/2020 0300   GLUCOSE 149 (H) 02/06/2020 0300   BUN 19 02/06/2020 0300   CREATININE 0.63 02/06/2020 0300   CALCIUM 8.6 (L) 02/06/2020 0300   PROT 6.8 02/06/2020 0300   ALBUMIN 2.0 (L) 02/06/2020 0300   AST 21 02/06/2020 0300    ALT 31 02/06/2020 0300   ALKPHOS 146 (H) 02/06/2020 0300   BILITOT <0.1 (L) 02/06/2020 0300   GFRNONAA >60 02/06/2020 0300   GFRAA >60 02/06/2020 0300   Lipase     Component Value Date/Time   LIPASE 19 01/18/2020 0525       Studies/Results: No results found.  Anti-infectives: Anti-infectives (From admission, onward)   Start     Dose/Rate Route Frequency Ordered Stop   02/04/20 1400  meropenem (MERREM) 1 g in sodium chloride 0.9 % 100 mL IVPB     1 g 200 mL/hr over 30 Minutes Intravenous Every 8 hours 02/04/20 1245     02/02/20 0800  ceFEPIme (MAXIPIME) 2 g in sodium chloride 0.9 % 100 mL IVPB  Status:  Discontinued     2 g 200 mL/hr over 30 Minutes Intravenous Every 8 hours 02/02/20 0727 02/04/20 1245   02/01/20 1200  anidulafungin (ERAXIS) 100 mg in sodium chloride 0.9 % 100 mL IVPB  Status:  Discontinued     100 mg 78 mL/hr over 100 Minutes Intravenous Every 24 hours 01/31/20 1034 02/02/20 0942   02/01/20 0800  piperacillin-tazobactam (ZOSYN) IVPB 3.375 g  Status:  Discontinued     3.375 g 12.5 mL/hr over 240 Minutes Intravenous Every  8 hours 02/01/20 0735 02/02/20 0721   01/31/20 1200  anidulafungin (ERAXIS) 200 mg in sodium chloride 0.9 % 200 mL IVPB     200 mg 78 mL/hr over 200 Minutes Intravenous  Once 01/31/20 1034 01/31/20 1730   01/27/20 1200  cefTRIAXone (ROCEPHIN) 2 g in sodium chloride 0.9 % 100 mL IVPB  Status:  Discontinued     2 g 200 mL/hr over 30 Minutes Intravenous Every 24 hours 01/27/20 1041 02/01/20 0735   01/26/20 1000  anidulafungin (ERAXIS) 100 mg in sodium chloride 0.9 % 100 mL IVPB  Status:  Discontinued     100 mg 78 mL/hr over 100 Minutes Intravenous Every 24 hours 01/25/20 0956 01/27/20 1041   01/25/20 1100  anidulafungin (ERAXIS) 200 mg in sodium chloride 0.9 % 200 mL IVPB     200 mg 78 mL/hr over 200 Minutes Intravenous  Once 01/25/20 1006 01/25/20 1525   01/18/20 1400  piperacillin-tazobactam (ZOSYN) IVPB 3.375 g  Status:  Discontinued      3.375 g 12.5 mL/hr over 240 Minutes Intravenous Every 8 hours 01/18/20 1311 01/27/20 1041   01/18/20 0715  piperacillin-tazobactam (ZOSYN) IVPB 3.375 g     3.375 g 100 mL/hr over 30 Minutes Intravenous  Once 01/18/20 0707 01/18/20 0837       Assessment/Plan HTN Prediabetic- A1c6.3,SSI Tobacco abuse 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  tmax 100.9 Anemia- tx 1 unit of pRBCs 5/18, 5/23. hgb9.7, stable R brachial DVT- lovenox70mg  BID  Septic shock Sigmoid colon perforation secondary to chronic constipation and ulceration with feculent peritonitis and gross contamination of abdominal cavity with stool balls -S/pExploratory laparotomy with partial colectomy and end colostomy5/5 Dr. Brantley Stage  - POD#19  - surgical path:Perforation with acute inflammation and acute serositis, no evidence of malignancy  -wound vac MWF  - WOC RNfollowingfor new colostomyand vac  -cont TFs at 30cc/hr today to see how she tolerates this to assure she has no further emesis.  If she continues to tolerate, will slowly advance again tomorrow/  ID -zosyn 5/5>>5/14,5/19 -->5/20eraxis 5/12>>5/14, 5/19 -->5/20Rocephin 5/14 -->5/19. Cefepime 5/20-->5/22, Merrem 5/22 --> FEN -NGT/TFs at 30cc/hr VTE -SCDs, lovenox70mg  BID Foley -d/c 5/13 Dispo - ?LTAC, CM looking into this   LOS: 19 days    Henreitta Cea , [facility] 02/06/2020, 8:39 AM Please see Amion for pager number during day hours 7:00am-4:30pm or 7:00am -11:30am on weekends  Agree with above.  Ostomy has loose stool in bag, VAC in place, she is tolerating small amounts of TF (30 cc/hr)  Alphonsa Overall, MD, [facility] Surgery Office phone:   [phone]

## 2020-02-06 NOTE — Progress Notes (Signed)
Attempted to wean Precedex drip slowly today, drip down to 0.4 mcg/kg/hr, patient has become more agitated, increased drip up to 0.8 mcg/kg/min

## 2020-02-06 NOTE — TOC Progression Note (Addendum)
Transition of Care [facility]) - Progression Note    Patient Details  Name: Darlene Castillo MRN: [id number] Date of Birth: 1961-07-31  [facility] CM/SW Contact  Darlene Acre, RN Phone Number: 02/06/2020, 11:18 AM  Clinical Narrative:    tct-mother Darlene Castillo no answer tct-husband Darlene Castillo will have mother call back. TCF Mother Darlene Castillo the family chooses [facility].  Representative for  Notified  To start the precert process. Per erica roberson from [facility] and lauren Webster from [facility] pt can not come while on iv methadone .  Darlene Chapel pa notified. Expected Discharge Plan: Home/Self Care Barriers to Discharge: Continued Medical Work up  Expected Discharge Plan and Services Expected Discharge Plan: Home/Self Care       Living arrangements for the past 2 months: Single Family Home                                       Social Determinants of Health (SDOH) Interventions    Readmission Risk Interventions No flowsheet data found.

## 2020-02-06 NOTE — Progress Notes (Signed)
ANTICOAGULATION CONSULT NOTE  Pharmacy Consult for Lovenox Indication: DVT , R brachial  Allergies  Allergen Reactions  . Ibuprofen Itching   Patient Measurements: Height: 5\' 1"  (154.9 cm) Weight: 65.5 kg (144 lb 6.4 oz) IBW/kg (Calculated) : 47.8 Heparin Dosing Weight: 67kg  Vital Signs: Temp: 98.3 F (36.8 C) (05/24 0000) Temp Source: Axillary (05/24 0000) BP: 146/74 (05/24 0400) Pulse Rate: 76 (05/24 0400)  Labs: Recent Labs    02/04/20 0455 02/04/20 0836 02/05/20 0511 02/05/20 0511 02/05/20 0633 02/05/20 0823 02/05/20 1636 02/06/20 0300  HGB   < >  --  6.9*   < >  --   --  10.0* 9.7*  HCT   < >  --  23.0*  --   --   --  32.6* 31.5*  PLT   < >  --  759*  --   --   --  922* 884*  CREATININE  --  0.34*  --   --   --   --  0.59 0.63  TROPONINIHS  --   --   --   --  26* 27*  --   --    < > = values in this interval not displayed.   Estimated Creatinine Clearance: 65.6 mL/min (by C-G formula based on SCr of 0.63 mg/dL).  Assessment:  71 yoF s/p Sigmoid colon perforation secondary to chronic constipation and ulceration with feculent peritonitis and gross contamination of abdominal cavity -S/pExploratory laparotomy with partial colectomy and end colostomy5/5 Dr. 46 5/13: begin IV Heparin for R brachial DVT at 1000 units/hr, no bolus per CCM 5/15 heparin >> Lovenox 80 q12  02/06/2020  Hg 9.7 after 1 U PRBC 5/23, last transfusion 5/18,  PLTC 884 SCr WNL Wt 65.5 kg    Plan:  continue Lovenox to 65 mg Bee q12 (~ 1mg /kg q12) Consider change Lovenox to 1.5mg /kg q24 once out of ICU Monitor for s/s bleed CBC daily  6/18, Pharm.D 02/06/2020 7:12 AM

## 2020-02-06 NOTE — TOC Progression Note (Signed)
Transition of Care [facility]) - Progression Note    Patient Details  Name: Darlene Castillo MRN: [id number] Date of Birth: [birth year]  [facility]) CM/SW Contact  Golda Acre, RN Phone Number: 02/06/2020, 7:53 AM  Clinical Narrative:    Off iv sedation, vent and trach with psv,awake, abd wound vac, iv tpn npo merrem, wbc24.0, hgb 9.7 s/p 1 unit prbc's on [id number].     Expected Discharge Plan: Home/Self Care Barriers to Discharge: Continued Medical Work up  Expected Discharge Plan and Services Expected Discharge Plan: Home/Self Care       Living arrangements for the past 2 months: Single Family Home                                       Social Determinants of Health (SDOH) Interventions    Readmission Risk Interventions No flowsheet data found.

## 2020-02-06 NOTE — Progress Notes (Signed)
Nutrition Follow-up  DOCUMENTATION CODES:   Not applicable  INTERVENTION:  - continue Vital AF 1.2 @ 30 ml/hr and increase prostat from once/day to BID. - goal TF regimen: Vital AF 1.2 @ 50 ml/hr with 30 ml prostat BID to provide 1640 kcal (104% estimated kcal need), 120 grams protein, and 973 ml free water.  - free water per CCM or CCS.  * recommend G-tube placement as NGT has been in for 19 days.   NUTRITION DIAGNOSIS:   Inadequate oral intake related to inability to eat as evidenced by NPO status. 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 CT revealed colonic perforation in sigmoid/rectosigmoid.  Significant Events: 5/5- admission; NGT placed in R nare; ex lap with partial colectomy and end colostomy  5/6- initial RD assessment; triple lumen PICC placed in L brachial; TPN initiation 5/13- TF initiation  5/14- TPN decreased to 40 ml/hr 5/16- TPN decreased to 30 ml/hr 5/17- TF stopped and NGT to LIS overnight d/t bilious emesis 5/19- trach 5/20- vomiting episode (500 ml) and TF stopped and NGT to LIS at 0645; TPN stopped  5/22- TF re-started    Patient is on the vent via trach with NGT in R nare. This tube was placed on 5/5. She is receiving Vital AF 1.2 @ 30 ml/hr with 30 ml prostat once/day. This regimen is providing 964 kcal (61% re-estimated kcal need), 69 grams protein (60% estimated protein need), and 584 ml free water.   Patient discussed in rounds this AM. She was awake at time of RD visit and able to communicate in all ways other than  vocally. She denies any nausea this AM but is having abdominal discomfort that is a pressure or squeezing-like sensation.   Surgery team's note indicates plan to continue TF at 30 ml/hr and if patient continues to tolerate well, will advance 5/25.   Patient is currently intubated on ventilator support via trach MV: 8.5 L/min Temp (24hrs), Avg:99.9 F (37.7 C), Min:98.3 F (36.8 C), Max:100.9 F (38.3 C) Propofol: none BP: 161/77 and MAP: 104   Labs reviewed; CBGs: 128, 69, and 122 mg/dl, Ca: 8.6 mg/dl. Medications reviewed; 40 mg pepcid per NGT/day, sliding scale novolog, 2 units novolog every 4 hours, 17 g miralax per NGT BID, 40 mEq KCl per NGT x1 dose 5/24. IVF; D5-NS @ 50 ml/hr (204 kcal).  Drip; precedex @ 0.7 mcg/kg/hr.   Diet Order:   Diet Order            Diet NPO time specified Except for: Ice Chips  Diet effective midnight              EDUCATION NEEDS:   Not appropriate for education at this time  Skin:  Skin Assessment: Skin Integrity Issues: Skin Integrity Issues:: Incisions, Wound VAC Wound Vac: being emptied MWF Incisions: abdomen (5/5)  Last BM:  5/24 (300 ml via colostomy)  Height:   Ht Readings from Last 1 Encounters:  02/04/20 5\' 1"  (1.549 m)    Weight:   Wt Readings from Last 1  Encounters:  02/06/20 65.5 kg     Estimated Nutritional Needs:  Kcal:  1572 kcal Protein:  114-130 Fluid:  >/= 1.8 L/day     Trenton Gammon, MS, RD, LDN, CNSC Inpatient Clinical Dietitian RD pager # available in AMION  After hours/weekend pager # available in [facility]

## 2020-02-06 NOTE — Progress Notes (Signed)
NAME:  Darlene Castillo, MRN:  [id number], DOB:  05/19/1961, LOS: 19 ADMISSION DATE:  01/18/2020, CONSULTATION DATE:  01/18/20 REFERRING MD:  Harriette Bouillon CHIEF COMPLAINT:  VDRF, septic shock  Brief History   59 y/o F admitted 5/5 with abdominal pain, constipation and one episode of vomiting. Found to have a perforated sigmoid colon with peritonitis.  To OR for exploratory laparotomy with partial colectomy and end colostomy (negative pathology), returned to ICU post-operatively on mechanical ventilation. Prolonged respiratory failure s/p tracheostomy 5/19.  Difficult to control agitation / pain.    Past Medical History  Chronic opiate use Tobacco abuse DM HTN  Significant Hospital Events   5/05 Admit, ex lap with partial colectomy and end colostomy for perf:  neg path 5/11 Extubated 5/12 Failed extubation.  Initially DNI but reversed.   5/13  Back on vent. Fever curve better. Concern for A Fib but EKG sinus tach with PAC. Down to 40% fio2 . TF on hold but getting meds via NG. On TPN. On dilaudid gtt, scheduled methadone, versed gtt, clonidine, seroquel and nicotine patch. Started on IV heparin for RUE DVT  5/14 dc versed gtt. Working on weaning sedation. Changing abx to rocephin 5/15 Methadone held at family request  5/16 Restarted methadone due to significant agitation 5/18 Agitation overnight, sedation gtt's increased 5/19 Trach.  Dilaudid stopped. Intra stomal enema  5/20 Sedation reduction  5/21 On PSV wean 10/5.  TF on hold with emesis overnight.  5/22 PSV wean, diuresis with 1.5L UOP.  Nausea.  5/23 PRVC 1 unit for Hgb 6.9  Consults:  CCS PCCM  Procedures:  R IJ CVL 5/5 >> 5/6 ALine 5/5 >> 5/7  ETT 5/5 >> 5/11, replaced 5/12 >> 5/19  Trach 5/19 >> LUE PICC 5/6 >>  Significant Diagnostic Tests:   CT abdomen pelvis with contrast 01/17/2020 >> scattered groundglass opacities in the lower lungs bilaterally, no focal opacities.  Abdominal free air.  Multiple dilated loops of  bowel with stool.  Large R renal cyst.  Path 5/5 >> Perforation with acute inflammation and acute serositis. No evidence of malignancy.    [place] UE 5/13 >> RUE DVT  CT ABD / Pelvis w Contrast 5/17 >> increasingly organized apearance of LLQ & pelvic fluid with slight decrease in the deep pelvic fluid.  Slight increase in LLQ fluid. Interloop fluid in the left hemiabdomen is slightly increased overall.  Diffuse interloop fluid and mesenteric stranding is slightly diminished. Persistent small areas of loculated perihepatic fluid unchanged, largest area in the axial plane measureing 2.5 x 1.7 cm. Decreased formed stool in the colon but with slight increase in colonic distention particularly of the transverse colon.  May reflect colonic ileus.  Persistent bibasilar consolidation and pleural effusions without significant change.   CT Sinus 5/20 >> sinus mucosal edema, bilateral mastoid effusions, no air fluid levels, no abscess in neck  Micro Data:  Covid 5/5 >> negative Flu PCR 5/5 >> negative MRSA PCR 5/12 >> neg Tracheal aspirate 5/12 >> klebsiella > R ampicillin, Unasyn, I: zosyn Urine analysis 5/12 >> negative  Blood culture 5/12 >> negative  UC 5/19 >> negative  BCx2 5/18 >> negative  Tracheal Aspirate 5/20 >> pseudomonas aeruginosa >> I-ceftazidime, cefepime.  S-imipenem, cipro  Antimicrobials:  Zosyn 5/5 >> 5/14 Eraxis 5/12 >> 5/14 Rocephin 5/14 >> Eraxis 5/18 >> 5/20 Zosyn 5/19 >> 5/19 Cefepime 5/20 >> 5/22 Meropenem 5/22 >>  Interim history/subjective:  Fever curve improved overall / WBC stable ~ 12 PSV  wean 8/5 Glucose range 69-149 I/O - 1L UOP, 800 ml stool, +367ml in 24h Episode of hypoglycemia into 60's  Objective   Blood pressure (!) 146/74, pulse 76, temperature 99.7 F (37.6 C), temperature source Oral, resp. rate 18, height 5\' 1"  (1.549 m), weight 65.5 kg, last menstrual period 05/23/2013, SpO2 100 %.    Vent Mode: CPAP;PSV FiO2 (%):  [30 %] 30 % Set Rate:  [16  bmp] 16 bmp Vt Set:  [380 mL] 380 mL PEEP:  [5 cmH20] 5 cmH20 Pressure Support:  [8 cmH20-10 cmH20] 8 cmH20 Plateau Pressure:  [19 cmH20-21 cmH20] 19 cmH20   Intake/Output Summary (Last 24 hours) at 02/06/2020 0803 Last data filed at 02/06/2020 0600 Gross per 24 hour  Intake 2217.77 ml  Output 1850 ml  Net 367.77 ml   Filed Weights   02/04/20 0456 02/05/20 0500 02/06/20 0428  Weight: 63.5 kg 65.3 kg 65.5 kg   Exam: General: ill appearing adult female sitting up in chair, overall appears improved HEENT: MM pink/moist, trach midline c/d/i, wearing glasses  Neuro: awake, alert, interactive, MAE CV: s1s2 RRR, no m/r/g PULM:  Non-labored on PSV 8/5, lungs bilaterally clear  GI: soft, bsx4 hypoactive, midline VAC, LLQ colostomy  Extremities: warm/dry, trace generalized edema  Skin: no rashes or lesions   Resolved problems  Circulatory/septic shock  Assessment & Plan:  LOS 19 days   Acute hypoxic respiratory failure requiring mechanical ventilation, complicated by Klebsiella PNA.  Treated with 8 days of cefepime for PNA.  Pseudomonas in tracheal aspirate, ? Colonization with "few" growing.  -PRVC 8cc/kg as rest mode -Daily PSV attempts with goal ATC  -wean PEEP / FiO2 for sats >90% -continue meropenem, D3/x.  Consider 5 days  -follow intermittent CXR  -VAP prevention measures  -clip trac sutures ~ 5/28  Acute Metabolic Encephalopathy, started 5/12 Chronic Pain, on oral methadone PTA Difficulties with intermittent agitation, family requested for patient to come off home medications, agitated delirium worsened. Improved 5/17 back on home methadone -minimize narcotics / sedating medications  -klonopin taper to off  -continue methadone  -continue seroquel, will need taper to off as improves -wean precedex to off   Peritonitis 2/2 Sigmoid Colon Perforation from Chronic Constipation s/p ex lap with sigmoid resection and colostomy. Completed 10 days abx for initial peritonitis.  Required enema for constipation 5/19. -post-operative care per CCS -follow stoma output  -pain control per CCS  Right Brachial DVT -continue lovenox   Emesis -per CCS -PRN zofran   Moderate Malnutrition Expected prolonged ileus post- op, which is improving. -TF per CCS  Intermittent Fluid and Electrolyte imbalance - hypokalemia  -monitor, replace as indicated   Hyperglycemia -SSI  Anemia  Suspect multifactorial in setting of critical illness, recurrent phlebotomy. No acute evidence of bleeding  -follow CBC  -transfuse for Hgb <7%  Intermittent Fevers Persistent intermittent fever despite abx.  DVT identified and being treated with full dose anticoagulation. Consider right chest effusion, ongoing constipation, drug fever, sinusitis. GB normal on CT imaging, less likely acalculous cholecystitis. CT sinuses discussed with radiology > appears like retained mucus in mastoid sinuses, air fluid levels expected with NGT in place, non-specific findings. Eraxis stopped with risk of drug fever.  New pseudomonas in tracheal aspirate, ? Colonization given number of colonies  -abx / cultures as above.   -minimize lines, no foley   Mild Elevation of LFT's  -follow LFT's   Best practice:  Diet: NPO; TPN & TF Pain/Anxiety/Delirium protocol (if indicated): see above VAP  protocol (if indicated): In place DVT prophylaxis: SCDs and lovenox GI prophylaxis: pepcid Glucose control: SSI Mobility: bedrest Code Status: full code Family Communication: Mother called for update 5/24 via phone.  Disposition: ICU   BMP Latest Ref Rng & Units 02/06/2020 02/05/2020 02/04/2020  Glucose 70 - 99 mg/dL 149(H) 115(H) 91  BUN 6 - 20 mg/dL 19 22(H) 19  Creatinine 0.44 - 1.00 mg/dL 0.63 0.59 0.34(L)  Sodium 135 - 145 mmol/L 140 143 145  Potassium 3.5 - 5.1 mmol/L 3.6 3.6 2.7(LL)  Chloride 98 - 111 mmol/L 106 107 122(H)  CO2 22 - 32 mmol/L 26 26 20(L)  Calcium 8.9 - 10.3 mg/dL 8.6(L) 8.8(L) 5.7(LL)     CBC    Component Value Date/Time   WBC 12.0 (H) 02/06/2020 0300   RBC 3.71 (L) 02/06/2020 0300   HGB 9.7 (L) 02/06/2020 0300   HCT 31.5 (L) 02/06/2020 0300   PLT 884 (H) 02/06/2020 0300   MCV 84.9 02/06/2020 0300   MCH 26.1 02/06/2020 0300   MCHC 30.8 02/06/2020 0300   RDW 15.0 02/06/2020 0300   LYMPHSABS 1.9 02/06/2020 0300   MONOABS 1.0 02/06/2020 0300   EOSABS 0.2 02/06/2020 0300   BASOSABS 0.0 02/06/2020 0300    Critical Care Time: 33 minutes  Noe Gens, MSN, NP-C [facility] 02/06/2020, 8:03 AM   Please see [email or web address] for pager details.

## 2020-02-06 NOTE — Consult Note (Signed)
WOC Nurse wound follow up Analgesia one hour prior to care.  Remains resistant to care, unable to perform or teach ostomy care at this time due to pain and fatigue.  Will continue to offer.  Pouch last changed yesterday.  Wound type:surgical Measurement:15 cm x 6 cm x 3.5 cm  Wound BMS:XJDB pink and moist  Nongranulating.  Bleeds with cleansing.  Drainage (amount, consistency, odor) minimal bleeding and serosanguinous  No odor.  Periwound:LLQ colostomy Dressing procedure/placement/frequency: 2 pieces black foam to fill wound bed. Seal immediately achieved.   WOC Nurse ostomy follow up Stoma type/location: LLQ colostomy  Changed yesterday.  Unable to participate in further care today.  WOC team will follow.  Maple Hudson MSN, RN, FNP-BC CWON Wound, Ostomy, Continence Nurse Pager [phone]

## 2020-02-07 ENCOUNTER — Inpatient Hospital Stay (HOSPITAL_COMMUNITY): Payer: BLUE CROSS/BLUE SHIELD

## 2020-02-07 LAB — GLUCOSE, CAPILLARY
Glucose-Capillary: 105 mg/dL — ABNORMAL HIGH (ref 70–99)
Glucose-Capillary: 125 mg/dL — ABNORMAL HIGH (ref 70–99)
Glucose-Capillary: 135 mg/dL — ABNORMAL HIGH (ref 70–99)
Glucose-Capillary: 138 mg/dL — ABNORMAL HIGH (ref 70–99)
Glucose-Capillary: 162 mg/dL — ABNORMAL HIGH (ref 70–99)
Glucose-Capillary: 168 mg/dL — ABNORMAL HIGH (ref 70–99)

## 2020-02-07 LAB — CBC
HCT: 31.3 % — ABNORMAL LOW (ref 36.0–46.0)
Hemoglobin: 9.7 g/dL — ABNORMAL LOW (ref 12.0–15.0)
MCH: 26.6 pg (ref 26.0–34.0)
MCHC: 31 g/dL (ref 30.0–36.0)
MCV: 85.8 fL (ref 80.0–100.0)
Platelets: 778 10*3/uL — ABNORMAL HIGH (ref 150–400)
RBC: 3.65 MIL/uL — ABNORMAL LOW (ref 3.87–5.11)
RDW: 15 % (ref 11.5–15.5)
WBC: 11.4 10*3/uL — ABNORMAL HIGH (ref 4.0–10.5)
nRBC: 0 % (ref 0.0–0.2)

## 2020-02-07 LAB — BASIC METABOLIC PANEL
Anion gap: 9 (ref 5–15)
BUN: 15 mg/dL (ref 6–20)
CO2: 27 mmol/L (ref 22–32)
Calcium: 8.8 mg/dL — ABNORMAL LOW (ref 8.9–10.3)
Chloride: 104 mmol/L (ref 98–111)
Creatinine, Ser: 0.52 mg/dL (ref 0.44–1.00)
GFR calc Af Amer: >60 mL/min (ref >60)
GFR calc non Af Amer: >60 mL/min (ref >60)
Glucose, Bld: 149 mg/dL — ABNORMAL HIGH (ref 70–99)
Potassium: 3.7 mmol/L (ref 3.5–5.1)
Sodium: 140 mmol/L (ref 135–145)

## 2020-02-07 MED ORDER — POLYETHYLENE GLYCOL 3350 17 G PO PACK
17.0000 g | PACK | Freq: Every day | ORAL | Status: DC
  Administered 2020-02-07: 17 g
  Filled 2020-02-07: qty 1

## 2020-02-07 MED ORDER — METHADONE HCL 10 MG PO TABS
10.0000 mg | ORAL_TABLET | Freq: Four times a day (QID) | ORAL | Status: DC
  Administered 2020-02-07 – 2020-02-10 (×12): 10 mg via NASOGASTRIC
  Filled 2020-02-07 (×12): qty 1

## 2020-02-07 MED ORDER — POTASSIUM CHLORIDE 20 MEQ/15ML (10%) PO SOLN
40.0000 meq | Freq: Once | ORAL | Status: AC
  Administered 2020-02-07: 40 meq
  Filled 2020-02-07: qty 30

## 2020-02-07 MED ORDER — POLYETHYLENE GLYCOL 3350 17 G PO PACK
17.0000 g | PACK | Freq: Every day | ORAL | Status: DC
  Administered 2020-02-08 – 2020-02-17 (×6): 17 g
  Filled 2020-02-07 (×7): qty 1

## 2020-02-07 MED ORDER — APIXABAN 5 MG PO TABS
5.0000 mg | ORAL_TABLET | Freq: Two times a day (BID) | ORAL | Status: DC
  Administered 2020-02-07 – 2020-02-10 (×8): 5 mg
  Filled 2020-02-07 (×8): qty 1

## 2020-02-07 MED ORDER — METHADONE HCL 10 MG/ML PO CONC
10.0000 mg | Freq: Four times a day (QID) | ORAL | Status: DC
  Administered 2020-02-07: 10 mg
  Filled 2020-02-07 (×2): qty 1

## 2020-02-07 MED ORDER — VITAL AF 1.2 CAL PO LIQD: 1000.0000 mL | ORAL | Status: DC

## 2020-02-07 MED ORDER — MELATONIN 3 MG PO TABS
3.0000 mg | ORAL_TABLET | Freq: Every day | ORAL | Status: DC
  Administered 2020-02-07 – 2020-02-09 (×3): 3 mg
  Filled 2020-02-07 (×3): qty 1

## 2020-02-07 MED ORDER — METHADONE HCL 10 MG/ML PO CONC
27.0000 mg | Freq: Every day | ORAL | Status: DC
  Filled 2020-02-07: qty 2.7

## 2020-02-07 NOTE — Progress Notes (Addendum)
Patient ID: Darlene Castillo, female   DOB: 1961-09-10, 59 y.o.   MRN: [id number]    20 Days Post-Op  Subjective:  Looks good today.  Smiling.  No nausea.  Hope to go to trach collar today.  ROS: unable, on vent still  Objective: Vital signs in last 24 hours: Temp:  [98.8 F (37.1 C)-100.5 F (38.1 C)] 98.8 F (37.1 C) (05/25 0400) Pulse Rate:  [72-97] 86 (05/25 0400) Resp:  [16-35] 17 (05/25 0400) BP: (143-173)/(77-92) 155/87 (05/25 0500) SpO2:  [95 %-100 %] 99 % (05/25 0749) FiO2 (%):  [30 %] 30 % (05/25 0749) Weight:  [63.1 kg] 63.1 kg (05/25 0500) Last BM Date: 02/06/20  Intake/Output from previous day: 05/24 0701 - 05/25 0700 In: 2769.8 [I.V.:1930; NG/GT:651; IV Piggyback:188.7] Out: 2300 [Urine:700; Drains:100; Stool:1500] Intake/Output this shift: No intake/output data recorded.  PE: Gen: sitting up  Heart: regular Lungs: CTAB, trach in place, weaning  Abd: soft, still with some mild distention, colostomy working great and currently full of air and liquid stool.    NGT in place with TFs running at 30cc  Lab Results:  Recent Labs    02/06/20 0300 02/07/20 0450  WBC 12.0* 11.4*  HGB 9.7* 9.7*  HCT 31.5* 31.3*  PLT 884* 778*   BMET Recent Labs    02/06/20 0300 02/07/20 0450  NA 140 140  K 3.6 3.7  CL 106 104  CO2 26 27  GLUCOSE 149* 149*  BUN 19 15  CREATININE 0.63 0.52  CALCIUM 8.6* 8.8*   PT/INR No results for input(s): LABPROT, INR in the last 72 hours. CMP     Component Value Date/Time   NA 140 02/07/2020 0450   K 3.7 02/07/2020 0450   CL 104 02/07/2020 0450   CO2 27 02/07/2020 0450   GLUCOSE 149 (H) 02/07/2020 0450   BUN 15 02/07/2020 0450   CREATININE 0.52 02/07/2020 0450   CALCIUM 8.8 (L) 02/07/2020 0450   PROT 6.8 02/06/2020 0300   ALBUMIN 2.0 (L) 02/06/2020 0300   AST 21 02/06/2020 0300   ALT 31 02/06/2020 0300   ALKPHOS 146 (H) 02/06/2020 0300   BILITOT <0.1 (L) 02/06/2020 0300   GFRNONAA >60 02/07/2020 0450   GFRAA >60  02/07/2020 0450   Lipase     Component Value Date/Time   LIPASE 19 01/18/2020 0525       Studies/Results: DG CHEST PORT 1 VIEW  Result Date: 02/07/2020 CLINICAL DATA:  Respiratory failure EXAM: PORTABLE CHEST 1 VIEW COMPARISON:  Radiograph 02/04/2020 FINDINGS: Stable positioning of the tracheostomy tube in the mid trachea, 4 cm from the carina. Transesophageal tube tip below the level of imaging, beyond the GE junction. A left upper extremity PICC tip terminates at the superior cavoatrial junction. Telemetry leads and support devices overlie the chest. Persistent gradient density towards the lung bases likely reflecting a combination of layering pleural effusion, atelectasis and possible underlying airspace disease/pneumonia. Size of the effusions and overall opacity appears slightly increased from the comparison study. Cardiomediastinal contours are stable. No acute osseous or soft tissue abnormality. Degenerative changes are present in the imaged spine and shoulders. IMPRESSION: 1. Lines and tubes as above. 2. Persistent gradient density towards the lung bases likely reflecting a combination of layering pleural effusion, atelectasis and possible underlying airspace disease/pneumonia. Overall, size of the effusions and overall opacity is likely mildly increased. Electronically Signed   By: Lovena Le M.D.   On: 02/07/2020 06:28    Anti-infectives: Anti-infectives (From admission, onward)  Start     Dose/Rate Route Frequency Ordered Stop   02/04/20 1400  meropenem (MERREM) 1 g in sodium chloride 0.9 % 100 mL IVPB     1 g 200 mL/hr over 30 Minutes Intravenous Every 8 hours 02/04/20 1245     02/02/20 0800  ceFEPIme (MAXIPIME) 2 g in sodium chloride 0.9 % 100 mL IVPB  Status:  Discontinued     2 g 200 mL/hr over 30 Minutes Intravenous Every 8 hours 02/02/20 0727 02/04/20 1245   02/01/20 1200  anidulafungin (ERAXIS) 100 mg in sodium chloride 0.9 % 100 mL IVPB  Status:  Discontinued     100  mg 78 mL/hr over 100 Minutes Intravenous Every 24 hours 01/31/20 1034 02/02/20 0942   02/01/20 0800  piperacillin-tazobactam (ZOSYN) IVPB 3.375 g  Status:  Discontinued     3.375 g 12.5 mL/hr over 240 Minutes Intravenous Every 8 hours 02/01/20 0735 02/02/20 0721   01/31/20 1200  anidulafungin (ERAXIS) 200 mg in sodium chloride 0.9 % 200 mL IVPB     200 mg 78 mL/hr over 200 Minutes Intravenous  Once 01/31/20 1034 01/31/20 1730   01/27/20 1200  cefTRIAXone (ROCEPHIN) 2 g in sodium chloride 0.9 % 100 mL IVPB  Status:  Discontinued     2 g 200 mL/hr over 30 Minutes Intravenous Every 24 hours 01/27/20 1041 02/01/20 0735   01/26/20 1000  anidulafungin (ERAXIS) 100 mg in sodium chloride 0.9 % 100 mL IVPB  Status:  Discontinued     100 mg 78 mL/hr over 100 Minutes Intravenous Every 24 hours 01/25/20 0956 01/27/20 1041   01/25/20 1100  anidulafungin (ERAXIS) 200 mg in sodium chloride 0.9 % 200 mL IVPB     200 mg 78 mL/hr over 200 Minutes Intravenous  Once 01/25/20 1006 01/25/20 1525   01/18/20 1400  piperacillin-tazobactam (ZOSYN) IVPB 3.375 g  Status:  Discontinued     3.375 g 12.5 mL/hr over 240 Minutes Intravenous Every 8 hours 01/18/20 1311 01/27/20 1041   01/18/20 0715  piperacillin-tazobactam (ZOSYN) IVPB 3.375 g     3.375 g 100 mL/hr over 30 Minutes Intravenous  Once 01/18/20 0707 01/18/20 0837       Assessment/Plan HTN Prediabetic- A1c6.3,SSI Tobacco abuse Prior h/o heroin abuse at least 15 years ago, on methadone Chronic constipation(secondary to chronic narcotic use)  - miralaxonce daily and lactulose BIDper tube, Movantik started as well - will wean some of these meds over the next few days Malnutrition- prealbumin14.5 (02/06/2020),  TFs at 30 cc/hr - to increase to 40 cc/hr VDRF, ?VAP-   extubated 5/11, reintubated 5/12.Trach5/19, weaning Fever -no clear etiology  WBC - 11,400 - 02/07/2020  Anemia- hgb9.7 (02/07/2020), stable R brachial DVT- lovenox65 mg  BID  Septic shock Sigmoid colon perforation secondary to chronic constipation and ulceration with feculent peritonitis and gross contamination of abdominal cavity with stool balls -S/pExploratory laparotomy with partial colectomy and end colostomy- 5/5 - Dr. Luisa Hart  - surgical path:Perforation with acute inflammation and acute serositis, no evidence of malignancy  -wound vac MWF  - WOC RNfollowingfor new colostomyand vac  - TFs at 30cc/hr - to increase to 40 cc/hr  ID -zosyn 5/5>>5/14,5/19 -->5/20eraxis 5/12>>5/14, 5/19 -->5/20Rocephin 5/14 -->5/19. Cefepime 5/20-->5/22, Merrem 5/22 --> FEN -NGT/TFs at 30cc/hr VTE -SCDs, lovenox65 mg BID Foley -d/c 5/13 Dispo - ?LTAC, CM looking into this   LOS: 20 days    Kandis Cocking , [facility] 02/07/2020, 7:52 AM

## 2020-02-07 NOTE — Progress Notes (Signed)
Physical Therapy Treatment Patient Details Name: Darlene Castillo MRN: [id number] DOB: 11/09/1960 Today's Date: 02/07/2020    History of Present Illness 59 yo female admitted 01/18/20 with the diagnosis of pneumoperitoneum, peritonitis,  perforated sigmoid colon status post ex lap with colectomy and ostomy remains on the ventilator/trach.  Unable to wean successfully d/t ARF with hypoxemia in setting of major abdominal surgery and acute pulmonary edema.PMH: tobacco use, HTN    PT Comments    The patient is on her phone majority of time(not sure about what). Patient communicates by writing notes. The patient did stand and take small shuffling steps to recliner. Patient on30%,pressure support. SPO2 89% post transfer to 95%. Continue progressive mobility.  Follow Up Recommendations  LTACH     Equipment Recommendations  None recommended by PT    Recommendations for Other Services       Precautions / Restrictions Precautions Precautions: Fall Precaution Comments: trach,on Vent, NGT, multiple lines, abd VAC, Restrictions Weight Bearing Restrictions: No    Mobility  Bed Mobility Overal bed mobility: Needs Assistance Bed Mobility: Supine to Sit Rolling: Min guard   Supine to sit: Mod assist;+2 for safety/equipment     General bed mobility comments: bed pad utilized to facilitate pivoting hips and to stabilize trunk during power up. Pt able to hold arm rest vs therapist arm to facilitate.   Transfers Overall transfer level: Needs assistance Equipment used: 2 person hand held assist Transfers: Sit to/from [facility] Sit to Stand: Mod assist;+2 safety/equipment Stand pivot transfers: Mod assist;+2 safety/equipment       General transfer comment: from EOB to recliner. +2 HHA assist. assist to steady, powerup and control descent  Ambulation/Gait                 Stairs             Wheelchair Mobility    Modified Rankin (Stroke Patients Only)        Balance Overall balance assessment: Needs assistance Sitting-balance support: Bilateral upper extremity supported;Feet supported Sitting balance-Leahy Scale: Poor Sitting balance - Comments: initially mod A to sit EOD to steady trunk. Improved to close min guard.                                     Cognition Arousal/Alertness: Awake/alert Behavior During Therapy: WFL for tasks assessed/performed Overall Cognitive Status: Difficult to assess                                 General Comments: answering questions appropriately. writing notes to communicate.      Exercises Other Exercises Other Exercises: issued level 1 theraband and instructed in use with pt providing return demo. Also provided grip strenthening exercises.     General Comments        Pertinent Vitals/Pain Pain Assessment: No/denies pain Faces Pain Scale: Hurts little more Pain Location: abd Pain Descriptors / Indicators: Grimacing Pain Intervention(s): Premedicated before session;Monitored during session    Home Living                      Prior Function            PT Goals (current goals can now be found in the care plan section) Acute Rehab PT Goals Patient Stated Goal: none stated/trach Progress towards PT goals: Progressing toward goals  Frequency    Min 2X/week      PT Plan Current plan remains appropriate    Co-evaluation PT/OT/SLP Co-Evaluation/Treatment: Yes Reason for Co-Treatment: Complexity of the patient's impairments (multi-system involvement) PT goals addressed during session: Mobility/safety with mobility OT goals addressed during session: ADL's and self-care      AM-PAC PT "6 Clicks" Mobility   Outcome Measure  Help needed turning from your back to your side while in a flat bed without using bedrails?: A Little Help needed moving from lying on your back to sitting on the side of a flat bed without using bedrails?: A Lot Help  needed moving to and from a bed to a chair (including a wheelchair)?: A Lot Help needed standing up from a chair using your arms (e.g., wheelchair or bedside chair)?: A Lot Help needed to walk in hospital room?: Total Help needed climbing 3-5 steps with a railing? : Total 6 Click Score: 11    End of Session   Activity Tolerance: Patient tolerated treatment well Patient left: in chair;with chair alarm set;with call bell/phone within reach Nurse Communication: Mobility status PT Visit Diagnosis: Other abnormalities of gait and mobility (R26.89);Muscle weakness (generalized) (M62.81)     Time: 4970-2637 PT Time Calculation (min) (ACUTE ONLY): 47 min  Charges:  $Therapeutic Activity: 8-22 mins                     Blanchard Kelch PT Acute Rehabilitation Services Pager [phone] Office [phone]    Rada Hay 02/07/2020, 1:39 PM

## 2020-02-07 NOTE — Progress Notes (Signed)
Occupational Therapy Treatment Patient Details Name: Darlene Castillo MRN: [id number] DOB: 05/04/1961 Today's Date: 02/07/2020    History of present illness 59 yo female admitted 01/18/20 with the diagnosis of pneumoperitoneum, peritonitis,  perforated sigmoid colon status post ex lap with colectomy and ostomy remains on the ventilator/trach.  Unable to wean successfully d/t ARF with hypoxemia in setting of major abdominal surgery and acute pulmonary edema.PMH: tobacco use, HTN   OT comments  Pt progressing towards acute OT goals. Focus of session was bed mobility and simulated toilet transfer. Pt completed OOB transfer to recliner as stand-pivot with +2 HHA. Pt with dizziness/"feel doped up" once sitting EOB, VSS including bp. After sitting a few minutes pt feeling better and able to take pivotal steps to sit up in recliner. Issued level 1 theraband and instructed in exercises for general UB strengthening. D/c plan remains appropriate.    Follow Up Recommendations  LTACH    Equipment Recommendations  Other (comment)(defer to next venue)    Recommendations for Other Services      Precautions / Restrictions Precautions Precautions: Fall Precaution Comments: trach, NGT, multiple lines Restrictions Weight Bearing Restrictions: No       Mobility Bed Mobility Overal bed mobility: Needs Assistance Bed Mobility: Supine to Sit     Supine to sit: Mod assist;+2 for safety/equipment     General bed mobility comments: bed pad utilized to facilitate pivoting hips and to stabilize trunk during power up. Pt able to hold arm rest vs therapist arm to facilitate.   Transfers Overall transfer level: Needs assistance Equipment used: 2 person hand held assist Transfers: Sit to/from [facility] Sit to Stand: Mod assist;+2 safety/equipment Stand pivot transfers: Mod assist;+2 safety/equipment       General transfer comment: from EOB to recliner. +2 HHA assist. assist to steady,  powerup and control descent    Balance Overall balance assessment: Needs assistance Sitting-balance support: Bilateral upper extremity supported;Feet supported Sitting balance-Leahy Scale: Poor Sitting balance - Comments: initially mod A to sit EOD to steady trunk. Improved to close min guard.                                    ADL either performed or assessed with clinical judgement   ADL Overall ADL's : Needs assistance/impaired                         Toilet Transfer: Moderate assistance;+2 for safety/equipment;Stand-pivot Toilet Transfer Details (indicate cue type and reason): simulated with EOB to recliner           General ADL Comments: +2 HHA to pivot to recliner. assist to scoot back in recliner     Vision       Perception     Praxis      Cognition Arousal/Alertness: Awake/alert Behavior During Therapy: [facility] for tasks assessed/performed Overall Cognitive Status: Difficult to assess                                 General Comments: answering questions appropriately. Listed out family members who are nurses including pt's mother.        Exercises Exercises: Other exercises Other Exercises Other Exercises: issued level 1 theraband and instructed in use with pt providing return demo. Also provided grip strenthening exercises.    Shoulder Instructions  General Comments      Pertinent Vitals/ Pain       Pain Assessment: Faces Faces Pain Scale: Hurts little more Pain Location: abd Pain Descriptors / Indicators: Grimacing Pain Intervention(s): Premedicated before session;Monitored during session;Limited activity within patient's tolerance  Home Living                                          Prior Functioning/Environment              Frequency  Min 2X/week        Progress Toward Goals  OT Goals(current goals can now be found in the care plan section)  Progress towards OT goals:  Progressing toward goals  Acute Rehab OT Goals Patient Stated Goal: none stated/trach OT Goal Formulation: With patient Time For Goal Achievement: 02/17/20 Potential to Achieve Goals: Good ADL Goals Pt Will Perform Grooming: with mod assist;bed level Pt Will Perform Upper Body Bathing: with mod assist;bed level Pt/caregiver will Perform Home Exercise Program: Increased strength;Both right and left upper extremity;With theraband;With Supervision;With written HEP provided Additional ADL Goal #1: Pt will sit EOB for 2 minutes at mod A level to prepare for EOB ADLs. Additional ADL Goal #2: Pt will complete bed mobility to sit EOB with mod A in preparation for OOB/EOB ADLs.  Plan Discharge plan remains appropriate    Co-evaluation    PT/OT/SLP Co-Evaluation/Treatment: Yes Reason for Co-Treatment: Complexity of the patient's impairments (multi-system involvement);To address functional/ADL transfers;For patient/therapist safety   OT goals addressed during session: ADL's and self-care;Strengthening/ROM      AM-PAC OT "6 Clicks" Daily Activity     Outcome Measure   Help from another person eating meals?: Total Help from another person taking care of personal grooming?: A Lot Help from another person toileting, which includes using toliet, bedpan, or urinal?: A Lot Help from another person bathing (including washing, rinsing, drying)?: A Lot Help from another person to put on and taking off regular upper body clothing?: A Lot Help from another person to put on and taking off regular lower body clothing?: A Lot 6 Click Score: 11    End of Session    OT Visit Diagnosis: Other abnormalities of gait and mobility (R26.89);Muscle weakness (generalized) (M62.81);Pain   Activity Tolerance     Patient Left     Nurse Communication          Time: 1010-1102 OT Time Calculation (min): 52 min  Charges: OT General Charges $OT Visit: 1 Visit OT Treatments $Self Care/Home Management :  8-22 mins  Raynald Kemp, OT Acute Rehabilitation Services Pager: [phone] Office: [phone]    Pilar Grammes 02/07/2020, 1:23 PM

## 2020-02-07 NOTE — Progress Notes (Signed)
Placed PT on 28% ATC- tolerating very well at this time. RN aware. 

## 2020-02-07 NOTE — Progress Notes (Signed)
K 3.7 Replaced per protocol 

## 2020-02-07 NOTE — Progress Notes (Signed)
NAME:  Darlene Castillo, MRN:  [id number], DOB:  18-Jun-1961, LOS: 70 ADMISSION DATE:  01/18/2020, CONSULTATION DATE:  01/18/20 REFERRING MD:  Erroll Luna CHIEF COMPLAINT:  VDRF, septic shock  Brief History   59 y/o F admitted 5/5 with abdominal pain, constipation and one episode of vomiting. Found to have a perforated sigmoid colon with peritonitis.  To OR for exploratory laparotomy with partial colectomy and end colostomy (negative pathology), returned to ICU post-operatively on mechanical ventilation. Prolonged respiratory failure s/p tracheostomy 5/19.  Difficult to control agitation / pain.    Past Medical History  Chronic opiate use Tobacco abuse DM HTN  Significant Hospital Events   5/05 Admit, ex lap with partial colectomy and end colostomy for perf:  neg path 5/11 Extubated 5/12 Failed extubation.  Initially DNI but reversed.   5/13  Back on vent. Fever curve better. Concern for A Fib but EKG sinus tach with PAC. Down to 40% fio2 . TF on hold but getting meds via NG. On TPN. On dilaudid gtt, scheduled methadone, versed gtt, clonidine, seroquel and nicotine patch. Started on IV heparin for RUE DVT  5/14 dc versed gtt. Working on weaning sedation. Changing abx to rocephin 5/15 Methadone held at family request  5/16 Restarted methadone due to significant agitation 5/18 Agitation overnight, sedation gtt's increased 5/19 Trach.  Dilaudid stopped. Intra stomal enema  5/20 Sedation reduction  5/21 On PSV wean 10/5.  TF on hold with emesis overnight.  5/22 PSV wean, diuresis with 1.5L UOP.  Nausea.  5/23 PRVC 1 unit for Hgb 6.9 5/24 Up in chair, weaned on PSV   Consults:  CCS PCCM  Procedures:  R IJ CVL 5/5 >> 5/6 ALine 5/5 >> 5/7  ETT 5/5 >> 5/11, replaced 5/12 >> 5/19  Trach 5/19 >> LUE PICC 5/6 >>  Significant Diagnostic Tests:   CT abdomen pelvis with contrast 01/17/2020 >> scattered groundglass opacities in the lower lungs bilaterally, no focal opacities.  Abdominal free  air.  Multiple dilated loops of bowel with stool.  Large R renal cyst.  Path 5/5 >> Perforation with acute inflammation and acute serositis. No evidence of malignancy.    [place] UE 5/13 >> RUE DVT  CT ABD / Pelvis w Contrast 5/17 >> increasingly organized apearance of LLQ & pelvic fluid with slight decrease in the deep pelvic fluid.  Slight increase in LLQ fluid. Interloop fluid in the left hemiabdomen is slightly increased overall.  Diffuse interloop fluid and mesenteric stranding is slightly diminished. Persistent small areas of loculated perihepatic fluid unchanged, largest area in the axial plane measureing 2.5 x 1.7 cm. Decreased formed stool in the colon but with slight increase in colonic distention particularly of the transverse colon.  May reflect colonic ileus.  Persistent bibasilar consolidation and pleural effusions without significant change.   CT Sinus 5/20 >> sinus mucosal edema, bilateral mastoid effusions, no air fluid levels, no abscess in neck  Micro Data:  Covid 5/5 >> negative Flu PCR 5/5 >> negative MRSA PCR 5/12 >> neg Tracheal aspirate 5/12 >> klebsiella > R ampicillin, Unasyn, I: zosyn Urine analysis 5/12 >> negative  Blood culture 5/12 >> negative  UC 5/19 >> negative  BCx2 5/18 >> negative  Tracheal Aspirate 5/20 >> pseudomonas aeruginosa >> I-ceftazidime, cefepime.  S-imipenem, cipro  Antimicrobials:  Zosyn 5/5 >> 5/14 Eraxis 5/12 >> 5/14 Rocephin 5/14 >> Eraxis 5/18 >> 5/20 Zosyn 5/19 >> 5/19 Cefepime 5/20 >> 5/22 Meropenem 5/22 >>  Interim history/subjective:  Glucose range  135-169 (improved with decrease) Tmax 99.4, WBC 11.4  I/O UOP, stool, +469 in last 24 hours Pt reports feeling pretty good RN reports precedex decreased yesterday but increased overnight  Objective   Blood pressure (!) 155/87, pulse 86, temperature 98.8 F (37.1 C), temperature source Oral, resp. rate 17, height 5\' 1"  (1.549 m), weight 63.1 kg, last menstrual period  05/23/2013, SpO2 99 %.    Vent Mode: CPAP;PSV FiO2 (%):  [30 %] 30 % Set Rate:  [16 bmp] 16 bmp Vt Set:  [380 mL] 380 mL PEEP:  [5 cmH20] 5 cmH20 Pressure Support:  [5 cmH20-8 cmH20] 5 cmH20 Plateau Pressure:  [19 cmH20-22 cmH20] 19 cmH20   Intake/Output Summary (Last 24 hours) at 02/07/2020 0806 Last data filed at 02/07/2020 02/09/2020 Gross per 24 hour  Intake 2769.76 ml  Output 2150 ml  Net 619.76 ml   Filed Weights   02/05/20 0500 02/06/20 0428 02/07/20 0500  Weight: 65.3 kg 65.5 kg 63.1 kg   Exam: General: ill appearing adult female lying in bed in NAD, listening to music, overall approved in appearance   HEENT: MM pink/moist, trach midline c/d/i, wearing glasses Neuro: awake /alert, writes appropriate messages  CV: s1s2 RRR SR 80's, no m/r/g PULM: non-labored on vent, lungs bilaterally with rhonchi  GI: soft, midline VAC, mild distention, tolerating TF at 83ml/hr Extremities: warm/dry, no edema  Skin: no rashes or lesions  CXR 5/25 >> images personally reviewed, persistent R>L basilar layering effusion, +/- atelectasis vs consolidation on right   Resolved problems  Circulatory/septic shock Mild Elevation of LFT's   Assessment & Plan:  LOS 20 days   Acute hypoxic respiratory failure requiring mechanical ventilation, complicated by Klebsiella PNA.  Treated with 8 days of cefepime for PNA.  Pseudomonas in tracheal aspirate, ? Colonization with "few" growing.  -PRVC 8cc/kg as rest mode  -continue daily PSV with goal for ATC -wean PEEP / FiO2 for sats >90% -continue meropenem for pseudomonas in tracheal aspirate, D4/x.  -follow intermittent CXR  -VAP prevention measures  -clip trach sutures 5/26 to 5/28  Acute Metabolic Encephalopathy, started 5/12 Chronic Pain, on oral methadone PTA Difficulties with intermittent agitation, family requested for patient to come off home medications, agitated delirium worsened. Improved 5/17 back on home methadone -minimize all narcotics /  sedating medications  -continue klonopin taper, extend 0.25 dosing for now -methadone per CCS -seroquel PT -goal to wean precedex to off. Seems she is having difficulties at night.  Will att melatonin QHS   Peritonitis 2/2 Sigmoid Colon Perforation from Chronic Constipation s/p ex lap with sigmoid resection and colostomy. Completed 10 days abx for initial peritonitis. Required enema for constipation 5/19. -post-op care / wounds per CCS -excellent stoma output  -pain control per [facility]CS  Right Brachial DVT -continue lovenox for DVT  Emesis -per [facility]CS -PRN zofran   Moderate Malnutrition Expected prolonged ileus post- op, which is improving. -TF per CCS  Intermittent Fluid and Electrolyte imbalance - hypokalemia  -monitor, replace as indicated    Hyperglycemia -SSI  Anemia  Suspect multifactorial in setting of critical illness, recurrent phlebotomy. No acute evidence of bleeding  -follow CBC -transfuse for Hgb <7%  Intermittent Fevers Persistent intermittent fever despite abx.  DVT identified and being treated with full dose anticoagulation. Consider right chest effusion, ongoing constipation, drug fever, sinusitis. GB normal on CT imaging, less likely acalculous cholecystitis. CT sinuses discussed with radiology > appears like retained mucus in mastoid sinuses, air fluid levels expected with NGT  in place, non-specific findings. Eraxis stopped with risk of drug fever.  New pseudomonas in tracheal aspirate, ? Colonization given number of colonies  -abx as above -minimize all lines, no foley    Best practice:  Diet: NPO; TPN & TF Pain/Anxiety/Delirium protocol (if indicated): see above VAP protocol (if indicated): In place DVT prophylaxis: SCDs and lovenox GI prophylaxis: pepcid Glucose control: SSI Mobility: bedrest Code Status: full code Family Communication: Mother called for update 5/24 via phone. Patient updated 5/25.  Disposition: ICU   BMP Latest Ref Rng & Units  02/07/2020 02/06/2020 02/05/2020  Glucose 70 - 99 mg/dL 284(X) 324(M) 010(U)  BUN 6 - 20 mg/dL 15 19 72(Z)  Creatinine 0.44 - 1.00 mg/dL 3.66 4.40 3.47  Sodium 135 - 145 mmol/L 140 140 143  Potassium 3.5 - 5.1 mmol/L 3.7 3.6 3.6  Chloride 98 - 111 mmol/L 104 106 107  CO2 22 - 32 mmol/L 27 26 26   Calcium 8.9 - 10.3 mg/dL ) 4.2(V) 9.5(G)    CBC    Component Value Date/Time   WBC 11.4 (H) 02/07/2020 0450   RBC 3.65 (L) 02/07/2020 0450   HGB 9.7 (L) 02/07/2020 0450   HCT 31.3 (L) 02/07/2020 0450   PLT 778 (H) 02/07/2020 0450   MCV 85.8 02/07/2020 0450   MCH 26.6 02/07/2020 0450   MCHC 31.0 02/07/2020 0450   RDW 15.0 02/07/2020 0450   LYMPHSABS 1.9 02/06/2020 0300   MONOABS 1.0 02/06/2020 0300   EOSABS 0.2 02/06/2020 0300   BASOSABS 0.0 02/06/2020 0300    Critical Care Time: 31 minutes  02/08/2020, MSN, NP-C [facility] 02/07/2020, 8:06 AM   Please see [email or web address] for pager details.

## 2020-02-07 NOTE — Progress Notes (Signed)
ANTICOAGULATION CONSULT NOTE  Pharmacy Consult for Lovenox>>>Apixaban Indication: DVT , R brachial  Allergies  Allergen Reactions  . Ibuprofen Itching   Patient Measurements: Height: 5\' 1"  (154.9 cm) Weight: 63.1 kg (139 lb 1.8 oz) IBW/kg (Calculated) : 47.8 Heparin Dosing Weight: 67kg  Vital Signs: Temp: 98.7 F (37.1 C) (05/25 0900) Temp Source: Oral (05/25 0900) BP: 101/66 (05/25 0800) Pulse Rate: 87 (05/25 1000)  Labs: Recent Labs    02/05/20 0511 02/05/20 02/07/20 02/05/20 0823 02/05/20 1636 02/05/20 1636 02/06/20 0300 02/07/20 0450  HGB   < >  --   --  10.0*   < > 9.7* 9.7*  HCT   < >  --   --  32.6*  --  31.5* 31.3*  PLT   < >  --   --  922*  --  884* 778*  CREATININE  --   --   --  0.59  --  0.63 0.52  TROPONINIHS  --  26* 27*  --   --   --   --    < > = values in this interval not displayed.   Estimated Creatinine Clearance: 64.4 mL/min (by C-G formula based on SCr of 0.52 mg/dL).  Assessment:  42 yoF s/p Sigmoid colon perforation secondary to chronic constipation and ulceration with feculent peritonitis and gross contamination of abdominal cavity -S/pExploratory laparotomy with partial colectomy and end colostomy5/5 Dr. 46 5/13: begin IV Heparin for R brachial DVT at 1000 units/hr, no bolus per CCM 5/15 heparin >> Lovenox 80 q12  02/07/2020  Hg remains 9.7 after 1 U PRBC 5/23, last transfusion 5/18,  PLTC 778- remains elevated SCr WNL Wt 63.1 kg  To transition to Apixaban per tube, she has refused the last 3 doses of LMWH   Plan:  apixaban 5 mg per tube bid - no need for loading dose due to on full dose anticoagulation since 5/13  6/13, Pharm.D 02/07/2020 11:31 AM

## 2020-02-07 NOTE — TOC Progression Note (Addendum)
Transition of Care [facility]) - Progression Note    Patient Details  Name: AMIKA TASSIN MRN: [id number] Date of Birth: [birth year]  Transition of [facility]) CM/SW Contact  Golda Acre, RN Phone Number: 02/07/2020, 9:09 AM  Clinical Narrative:    [facility] not take due to be on Methadone for hx of heroin abuse.  [facility] will check to see if pharmacy can give.poer Prudy Feeler. [facility] can offer a bed.  TCT-Mr.Fackrell the husband explained the situation with him and he will discuss with the family and call back.    Expected Discharge Plan: Home/Self Care Barriers to Discharge: Continued Medical Work up  Expected Discharge Plan and Services Expected Discharge Plan: Home/Self Care       Living arrangements for the past 2 months: Single Family Home                                       Social Determinants of Health (SDOH) Interventions    Readmission Risk Interventions No flowsheet data found.

## 2020-02-08 LAB — BASIC METABOLIC PANEL
Anion gap: 10 (ref 5–15)
BUN: 16 mg/dL (ref 6–20)
CO2: 28 mmol/L (ref 22–32)
Calcium: 8.7 mg/dL — ABNORMAL LOW (ref 8.9–10.3)
Chloride: 101 mmol/L (ref 98–111)
Creatinine, Ser: 0.51 mg/dL (ref 0.44–1.00)
GFR calc Af Amer: >60 mL/min (ref >60)
GFR calc non Af Amer: >60 mL/min (ref >60)
Glucose, Bld: 128 mg/dL — ABNORMAL HIGH (ref 70–99)
Potassium: 3.5 mmol/L (ref 3.5–5.1)
Sodium: 139 mmol/L (ref 135–145)

## 2020-02-08 LAB — GLUCOSE, CAPILLARY
Glucose-Capillary: 123 mg/dL — ABNORMAL HIGH (ref 70–99)
Glucose-Capillary: 126 mg/dL — ABNORMAL HIGH (ref 70–99)
Glucose-Capillary: 116 mg/dL — ABNORMAL HIGH (ref 70–99)
Glucose-Capillary: 150 mg/dL — ABNORMAL HIGH (ref 70–99)
Glucose-Capillary: 141 mg/dL — ABNORMAL HIGH (ref 70–99)
Glucose-Capillary: 150 mg/dL — ABNORMAL HIGH (ref 70–99)

## 2020-02-08 MED ORDER — VITAL AF 1.2 CAL PO LIQD: 1000.0000 mL | ORAL | Status: DC

## 2020-02-08 MED ORDER — ACETAMINOPHEN 325 MG PO TABS
650.0000 mg | ORAL_TABLET | Freq: Once | ORAL | Status: AC
  Administered 2020-02-08: 650 mg via ORAL
  Filled 2020-02-08: qty 2

## 2020-02-08 MED ORDER — PROMETHAZINE HCL 25 MG/ML IJ SOLN
12.5000 mg | Freq: Once | INTRAMUSCULAR | Status: AC
  Administered 2020-02-08: 12.5 mg via INTRAVENOUS
  Filled 2020-02-08: qty 1

## 2020-02-08 MED ORDER — POTASSIUM CHLORIDE 10 MEQ/50ML IV SOLN
10.0000 meq | INTRAVENOUS | Status: AC
  Administered 2020-02-08 (×4): 10 meq via INTRAVENOUS
  Filled 2020-02-08 (×4): qty 50

## 2020-02-08 NOTE — Progress Notes (Addendum)
NAME:  Darlene Castillo, MRN:  [id number], DOB:  Apr 14, 1961, LOS: 21 ADMISSION DATE:  01/18/2020, CONSULTATION DATE:  01/18/20 REFERRING MD:  Harriette Bouillon CHIEF COMPLAINT:  VDRF, septic shock  Brief History   59 y/o F admitted 5/5 with abdominal pain, constipation and one episode of vomiting. Found to have a perforated sigmoid colon with peritonitis.  To OR for exploratory laparotomy with partial colectomy and end colostomy (negative pathology), returned to ICU post-operatively on mechanical ventilation. Prolonged respiratory failure s/p tracheostomy 5/19.  Difficult to control agitation / pain.    Past Medical History  Chronic opiate use Tobacco abuse DM HTN  Significant Hospital Events   5/05 Admit, ex lap with partial colectomy and end colostomy for perf:  neg path 5/11 Extubated 5/12 Failed extubation.  Initially DNI but reversed.   5/13  Back on vent. Fever curve better. Concern for A Fib but EKG sinus tach with PAC. Down to 40% fio2 . TF on hold but getting meds via NG. On TPN. On dilaudid gtt, scheduled methadone, versed gtt, clonidine, seroquel and nicotine patch. Started on IV heparin for RUE DVT  5/14 dc versed gtt. Working on weaning sedation. Changing abx to rocephin 5/15 Methadone held at family request  5/16 Restarted methadone due to significant agitation 5/18 Agitation overnight, sedation gtt's increased 5/19 Trach.  Dilaudid stopped. Intra stomal enema  5/20 Sedation reduction  5/21 On PSV wean 10/5.  TF on hold with emesis overnight.  5/22 PSV wean, diuresis with 1.5L UOP.  Nausea.  5/23 PRVC 1 unit for Hgb 6.9 5/24 Up in chair, weaned on PSV   Consults:  [facility]S PCCM  Procedures:  R IJ CVL 5/5 >> 5/6 ALine 5/5 >> 5/7  ETT 5/5 >> 5/11, replaced 5/12 >> 5/19  Trach 5/19 >> LUE PICC 5/6 >>  Significant Diagnostic Tests:   CT abdomen pelvis with contrast 01/17/2020 >> scattered groundglass opacities in the lower lungs bilaterally, no focal opacities.  Abdominal free  air.  Multiple dilated loops of bowel with stool.  Large R renal cyst.  Path 5/5 >> Perforation with acute inflammation and acute serositis. No evidence of malignancy.    [place] UE 5/13 >> RUE DVT  CT ABD / Pelvis w Contrast 5/17 >> increasingly organized apearance of LLQ & pelvic fluid with slight decrease in the deep pelvic fluid.  Slight increase in LLQ fluid. Interloop fluid in the left hemiabdomen is slightly increased overall.  Diffuse interloop fluid and mesenteric stranding is slightly diminished. Persistent small areas of loculated perihepatic fluid unchanged, largest area in the axial plane measureing 2.5 x 1.7 cm. Decreased formed stool in the colon but with slight increase in colonic distention particularly of the transverse colon.  May reflect colonic ileus.  Persistent bibasilar consolidation and pleural effusions without significant change.   CT Sinus 5/20 >> sinus mucosal edema, bilateral mastoid effusions, no air fluid levels, no abscess in neck  Micro Data:  Covid 5/5 >> negative Flu PCR 5/5 >> negative MRSA PCR 5/12 >> neg Tracheal aspirate 5/12 >> klebsiella > R ampicillin, Unasyn, I: zosyn Urine analysis 5/12 >> negative  Blood culture 5/12 >> negative  UC 5/19 >> negative  BCx2 5/18 >> negative  Tracheal Aspirate 5/20 >> pseudomonas aeruginosa >> I-ceftazidime, cefepime.  S-imipenem, cipro  Antimicrobials:  Zosyn 5/5 >> 5/14 Eraxis 5/12 >> 5/14 Rocephin 5/14 >> Eraxis 5/18 >> 5/20 Zosyn 5/19 >> 5/19 Cefepime 5/20 >> 5/22 Meropenem 5/22 >>  Interim history/subjective:  Pt weaned  on PSV from 8-noon, then ATC till 8pm Precedex increased at night > off this am  Tolerating TF  Tmax 100.5  I/O 2L UOP, 1L stool, -1.5L in last 24 hours   Objective   Blood pressure 114/83, pulse 81, temperature 97.7 F (36.5 C), temperature source Axillary, resp. rate 19, height 5\' 1"  (1.549 m), weight 63 kg, last menstrual period 05/23/2013, SpO2 99 %.    Vent Mode: PRVC FiO2 (%):   [28 %-30 %] 30 % Set Rate:  [16 bmp] 16 bmp Vt Set:  [380 mL] 380 mL PEEP:  [5 cmH20] 5 cmH20 Plateau Pressure:  [17 cmH20-21 cmH20] 19 cmH20   Intake/Output Summary (Last 24 hours) at 02/08/2020 3976 Last data filed at 02/08/2020 0700 Gross per 24 hour  Intake 1540.96 ml  Output 3125 ml  Net -1584.04 ml   Filed Weights   02/06/20 0428 02/07/20 0500 02/08/20 0402  Weight: 65.5 kg 63.1 kg 63 kg   Exam: General: adult female lying in bed in NAD on vent HEENT: MM pink/moist, trach midline c/d/i, sutures in place, anicteric Neuro: AAOx4, appropriate / writing messages, MAE CV: s1s2 RRR, no m/r/g PULM:  Non-labored on PSV 5/5, lungs bilaterally clear  GI: soft, bsx4 active, midline VAC, ostomy Extremities: warm/dry, no edema  Skin: no rashes or lesions  Resolved problems  Circulatory/septic shock Mild Elevation of LFT's   Assessment & Plan:  LOS 21 days   Acute hypoxic respiratory failure requiring mechanical ventilation, complicated by Klebsiella PNA.  Treated with 8 days of cefepime for PNA.  Pseudomonas in tracheal aspirate, ? Colonization with "few" growing.  -PRVC 8cc/kg as rest mode if needed  -PSV wean with transition to ATC and leave open ended  -meropenem for pseudomonas in tracheal aspirate, D5/x  -follow intermittent CXR  -VAP prevention measures  -clip trach sutures  -SLP evaluation for swallowing, PMV (discussed with CCS)  Acute Metabolic Encephalopathy, started 5/12 Chronic Pain, on oral methadone PTA Difficulties with intermittent agitation, family requested for patient to come off home medications, agitated delirium worsened. Improved 5/17 back on home methadone.   -minimize all narcotics / sedating medications  -turn precedex off and monitor  -continue klonopin taper -methadone per CCS -continue seroquel  -melatonin QHS   Peritonitis 2/2 Sigmoid Colon Perforation from Chronic Constipation s/p ex lap with sigmoid resection and colostomy. Completed 10  days abx for initial peritonitis. Required enema for constipation 5/19. -post-op care per CCS  -follow stoma output  -pain control per CCS  Right Brachial DVT -eliquis for DVT   Emesis -PRN zofran   Moderate Malnutrition Expected prolonged ileus post- op, which is improving. -TF per CCS, tolerating well  Intermittent Fluid and Electrolyte imbalance - hypokalemia  -monitor, replace as indicated   Hyperglycemia -SSI  Anemia  Suspect multifactorial in setting of critical illness, recurrent phlebotomy. No acute evidence of bleeding  -follow CBC, transfuse for Hgb <7%  Intermittent Fevers Persistent intermittent fever despite abx.  DVT identified and being treated with full dose anticoagulation. Consider right chest effusion, ongoing constipation, drug fever, sinusitis. GB normal on CT imaging, less likely acalculous cholecystitis. CT sinuses discussed with radiology > appears like retained mucus in mastoid sinuses, air fluid levels expected with NGT in place, non-specific findings. Eraxis stopped with risk of drug fever.  New pseudomonas in tracheal aspirate, ? Colonization given number of colonies  -abx as above  -minimize lines, no foley  Best practice:  Diet: NPO; TPN & TF Pain/Anxiety/Delirium protocol (if indicated): see  above VAP protocol (if indicated): In place DVT prophylaxis: Eliquis  GI prophylaxis: pepcid Glucose control: SSI Mobility: bedrest Code Status: full code Family Communication: Patient updated on plan of care 5/26.  She is hopeful to avoid going to LTAC if possible. She tolerated ATC for ~ 8 hours yesterday.  Will leave ATC open ended trial for today.  Precedex off.  SLP evaluation pending today for PMV + swallowing when speech feels appropriate.   Disposition: ICU   BMP Latest Ref Rng & Units 02/08/2020 02/07/2020 02/06/2020  Glucose 70 - 99 mg/dL 737(T) 062(I) 948(N)  BUN 6 - 20 mg/dL 16 15 19   Creatinine 0.44 - 1.00 mg/dL 4.62 7.03  Sodium 135 -  145 mmol/L 139 140 140  Potassium 3.5 - 5.1 mmol/L 3.5 3.7 3.6  Chloride 98 - 111 mmol/L 101 104 106  CO2 22 - 32 mmol/L 28 27 26   Calcium 8.9 - 10.3 mg/dL 5.00) ) 9.3(G)    CBC    Component Value Date/Time   WBC 11.4 (H) 02/07/2020 0450   RBC 3.65 (L) 02/07/2020 0450   HGB 9.7 (L) 02/07/2020 0450   HCT 31.3 (L) 02/07/2020 0450   PLT 778 (H) 02/07/2020 0450   MCV 85.8 02/07/2020 0450   MCH 26.6 02/07/2020 0450   MCHC 31.0 02/07/2020 0450   RDW 15.0 02/07/2020 0450   LYMPHSABS 1.9 02/06/2020 0300   MONOABS 1.0 02/06/2020 0300   EOSABS 0.2 02/06/2020 0300   BASOSABS 0.0 02/06/2020 0300    Critical Care Time: n/a  02/08/2020, MSN, NP-C [facility] 02/08/2020, 8:33 AM   Please see [email or web address] for pager details.

## 2020-02-08 NOTE — Progress Notes (Addendum)
Patient ID: Darlene Castillo, female   DOB: 05-29-1961, 59 y.o.   MRN: [id number]    21 Days Post-Op  Subjective:  Continues to have some nausea, but tolerating TFs at 40cc/hr.  Ostomy working very well.  Weaning well.  On pressure support currently.  ROS: unable, on vent still  Objective: Vital signs in last 24 hours: Temp:  [97.7 F (36.5 C)-100.5 F (38.1 C)] 97.7 F (36.5 C) (05/26 0759) Pulse Rate:  [68-90] 81 (05/26 0400) Resp:  [16-30] 19 (05/26 0400) BP: (114-173)/(71-90) 114/83 (05/26 0516) SpO2:  [95 %-100 %] 99 % (05/26 0739) FiO2 (%):  [28 %-30 %] 30 % (05/26 0739) Weight:  [63 kg] 63 kg (05/26 0402) Last BM Date: 02/08/20(Via colostomy)  Intake/Output from previous day: 05/25 0701 - 05/26 0700 In: 1541 [I.V.:973.8; NG/GT:369.3; IV Piggyback:197.9] Out: 3125 [Urine:2000; Drains:50; Stool:1075] Intake/Output this shift: No intake/output data recorded.  PE: Neck: trach in place Abd: soft, less distended, +BS, colostomy with good liquid feculent output.  Appropriately tender, VAC in place, will see with VAC change later this am.  Lab Results:  Recent Labs    02/06/20 0300 02/07/20 0450  WBC 12.0* 11.4*  HGB 9.7* 9.7*  HCT 31.5* 31.3*  PLT 884* 778*   BMET Recent Labs    02/07/20 0450 02/08/20 0500  NA 140 139  K 3.7 3.5  CL 104 101  CO2 27 28  GLUCOSE 149* 128*  BUN 15 16  CREATININE 0.52 0.51  CALCIUM 8.8* 8.7*   PT/INR No results for input(s): LABPROT, INR in the last 72 hours. CMP     Component Value Date/Time   NA 139 02/08/2020 0500   K 3.5 02/08/2020 0500   CL 101 02/08/2020 0500   CO2 28 02/08/2020 0500   GLUCOSE 128 (H) 02/08/2020 0500   BUN 16 02/08/2020 0500   CREATININE 0.51 02/08/2020 0500   CALCIUM 8.7 (L) 02/08/2020 0500   PROT 6.8 02/06/2020 0300   ALBUMIN 2.0 (L) 02/06/2020 0300   AST 21 02/06/2020 0300   ALT 31 02/06/2020 0300   ALKPHOS 146 (H) 02/06/2020 0300   BILITOT <0.1 (L) 02/06/2020 0300   GFRNONAA >60 02/08/2020  0500   GFRAA >60 02/08/2020 0500   Lipase     Component Value Date/Time   LIPASE 19 01/18/2020 0525       Studies/Results: DG CHEST PORT 1 VIEW  Result Date: 02/07/2020 CLINICAL DATA:  Respiratory failure EXAM: PORTABLE CHEST 1 VIEW COMPARISON:  Radiograph 02/04/2020 FINDINGS: Stable positioning of the tracheostomy tube in the mid trachea, 4 cm from the carina. Transesophageal tube tip below the level of imaging, beyond the GE junction. A left upper extremity PICC tip terminates at the superior cavoatrial junction. Telemetry leads and support devices overlie the chest. Persistent gradient density towards the lung bases likely reflecting a combination of layering pleural effusion, atelectasis and possible underlying airspace disease/pneumonia. Size of the effusions and overall opacity appears slightly increased from the comparison study. Cardiomediastinal contours are stable. No acute osseous or soft tissue abnormality. Degenerative changes are present in the imaged spine and shoulders. IMPRESSION: 1. Lines and tubes as above. 2. Persistent gradient density towards the lung bases likely reflecting a combination of layering pleural effusion, atelectasis and possible underlying airspace disease/pneumonia. Overall, size of the effusions and overall opacity is likely mildly increased. Electronically Signed   By: Kreg Shropshire M.D.   On: 02/07/2020 06:28    Anti-infectives: Anti-infectives (From admission, onward)   Start  Dose/Rate Route Frequency Ordered Stop   02/04/20 1400  meropenem (MERREM) 1 g in sodium chloride 0.9 % 100 mL IVPB     1 g 200 mL/hr over 30 Minutes Intravenous Every 8 hours 02/04/20 1245     02/02/20 0800  ceFEPIme (MAXIPIME) 2 g in sodium chloride 0.9 % 100 mL IVPB  Status:  Discontinued     2 g 200 mL/hr over 30 Minutes Intravenous Every 8 hours 02/02/20 0727 02/04/20 1245   02/01/20 1200  anidulafungin (ERAXIS) 100 mg in sodium chloride 0.9 % 100 mL IVPB  Status:   Discontinued     100 mg 78 mL/hr over 100 Minutes Intravenous Every 24 hours 01/31/20 1034 02/02/20 0942   02/01/20 0800  piperacillin-tazobactam (ZOSYN) IVPB 3.375 g  Status:  Discontinued     3.375 g 12.5 mL/hr over 240 Minutes Intravenous Every 8 hours 02/01/20 0735 02/02/20 0721   01/31/20 1200  anidulafungin (ERAXIS) 200 mg in sodium chloride 0.9 % 200 mL IVPB     200 mg 78 mL/hr over 200 Minutes Intravenous  Once 01/31/20 1034 01/31/20 1730   01/27/20 1200  cefTRIAXone (ROCEPHIN) 2 g in sodium chloride 0.9 % 100 mL IVPB  Status:  Discontinued     2 g 200 mL/hr over 30 Minutes Intravenous Every 24 hours 01/27/20 1041 02/01/20 0735   01/26/20 1000  anidulafungin (ERAXIS) 100 mg in sodium chloride 0.9 % 100 mL IVPB  Status:  Discontinued     100 mg 78 mL/hr over 100 Minutes Intravenous Every 24 hours 01/25/20 0956 01/27/20 1041   01/25/20 1100  anidulafungin (ERAXIS) 200 mg in sodium chloride 0.9 % 200 mL IVPB     200 mg 78 mL/hr over 200 Minutes Intravenous  Once 01/25/20 1006 01/25/20 1525   01/18/20 1400  piperacillin-tazobactam (ZOSYN) IVPB 3.375 g  Status:  Discontinued     3.375 g 12.5 mL/hr over 240 Minutes Intravenous Every 8 hours 01/18/20 1311 01/27/20 1041   01/18/20 0715  piperacillin-tazobactam (ZOSYN) IVPB 3.375 g     3.375 g 100 mL/hr over 30 Minutes Intravenous  Once 01/18/20 0707 01/18/20 0837       Assessment/Plan HTN Prediabetic- A1c6.3,SSI Tobacco abuse 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  On Merrem for pseudomonal PNA Fever -Tmax 100.5.   Anemia- tx 1 unit of pRBCs 5/18, 5/23. hgb9.7, stable R brachial DVT- eliquis  Septic shock Sigmoid colon perforation secondary to chronic constipation and ulceration with feculent peritonitis and gross contamination  of abdominal cavity with stool balls -S/pExploratory laparotomy with partial colectomy and end colostomy5/5 Dr. Brantley Stage  - POD#21  - surgical path:Perforation with acute inflammation and acute serositis, no evidence of malignancy  -wound vac MWF - changed to wet to dry dressings  - WOC RNfollowingfor new colostomyand vac  - increase TFs to 50cc/hr today.  Cont with zofran and compazine for nausea.  Unclear if she may have some gastroparesis or something like that as her bowel is working well and she should be able to tolerate TFs  ID - Merrem - PNA FEN -NGT/TFs VTE -SCDs, eliquis Foley -d/c 5/13 Dispo - ?LTAC, CM looking into this   LOS: 21 days    Henreitta Cea , [facility] 02/08/2020, 8:28 AM Please see Amion for pager number during day hours 7:00am-4:30pm or 7:00am -11:30am on weekends  Agree with above.  Steadily improving on multiple fronts - on trach collar, wound looks good with VAC changed to dressing changes, tube feedings increased  Ovidio Kin, MD, [facility] Office phone:  [phone]

## 2020-02-08 NOTE — Consult Note (Signed)
WOC Nurse wound follow up Wound type:surgical  Measurement: 16cm x6cm x 4cm  Wound bed:100% clean, early granulation tissue Drainage (amount, consistency, odor) minimal, serosanguinous  Periwound: intact  Dressing procedure/placement/frequency: NPWT dressing removed, patient received IV pain meds prior to dressing change. CCS PA Tresa Endo and Starr at the bedside, they have decided to DC NPWT dressing today and convert to saline moist to moist dressings daily. Notified bedside nurse. PA updated wound care orders.    WOC Nurse ostomy follow up Stoma type/location: LLQ, end colostomy Stomal assessment/size: 1 3/4" round, budded, pink, moist Peristomal assessment: intact  Treatment options for stomal/peristomal skin: 2" barrier ring Output liquid yellow/seedy Ostomy pouching: 2pc. 2 3/4" with 2" barrier ring Education provided:  Patient is awake and alert now, trach in place. Communicates with writing tablet. She is able and willing to watch WOC nurse prepare new pouch for change and engaged with learning. However she does not want to look at stoma currently  Demonstrated pouch change (cutting new skin barrier, measuring stoma, cleaning peristomal skin and stoma, use of barrier ring) Demonstrated lock and roll closure.  She wishes for her mom Lavona Norsworthy who is an Char[employer] fundraiser to be involved in her care.  Will follow up on patient's mother and ability to attend any teaching sessions maybe next week.   Enrolled patient in [place] Secure Start Discharge program: Yes previously   WOC Nurse will follow along with you for continued support with ostomy teaching and care Sallye Lunz [facility] MSN, RN, [place], CNS, [place] [phone]

## 2020-02-08 NOTE — Progress Notes (Signed)
Pt. tolerating ATC well, >'d FI02 up to 35%/8lpm, no >'d WOB noted, Pt. w/o c/o, RN made aware, RT to monitor for need to be placed back on Nocturnal Ventilation.

## 2020-02-08 NOTE — Progress Notes (Signed)
Pt remains on TC 28%. No distress at this time.

## 2020-02-08 NOTE — Progress Notes (Signed)
Pt took off vent and placed on TC 28%. No distress noted at this time.

## 2020-02-08 NOTE — Progress Notes (Signed)
Tracheal Aspirate obtained/labelled/sent to lab per order, RN made aware. 

## 2020-02-08 NOTE — Progress Notes (Signed)
   Wound 100% clean.  Wound VAC change too painful for patient.  She requests to stop VAC and change to WD dressing changes.  Will initiate this daily.    Also spoke to CCM.  Patient would like to avoid LTAC.  She is weaning and on trach collar currently.  Trying PMV today and MBS.  If she can tolerate these things and progress, then can likely avoid LTAC and plan for SNF vs home pending how she progresses.  Letha Cape 10:15 AM 02/08/2020

## 2020-02-08 NOTE — Evaluation (Signed)
[facility]y-[facility] Speaking Valve - Evaluation Patient Details  Name: Darlene Castillo MRN: [id number] Date of Birth: 1961/04/12  Today's Date: 02/08/2020 Time: 1445-1510 SLP Time Calculation (min) (ACUTE ONLY): 25 min  Past Medical History:  Past Medical History:  Diagnosis Date  . Blurred vision   . Cervicalgia   . Chronic pain   . Diabetes mellitus without complication (HCC)    diet controlled borderline  . Headache   . Hypertension   . Numbness and tingling    Past Surgical History:  Past Surgical History:  Procedure Laterality Date  . CESAREAN SECTION    . COLON RESECTION N/A 01/18/2020   Procedure: EXPLORATORY LAPAROTOMY SIGMOID RESECTION AND COLOSTOMY;  Surgeon: Harriette Bouillon, MD;  Location: [facility]S;  Service: General;  Laterality: N/A;  . MYOMECTOMY    . wisdoms teeth extracted     HPI:  59yo female admitted 01/18/20 with acute worsening of abdominal pain. PMH: HTN, ?DM, tobacco abuse, prior heroin use now on methadone   Assessment / Plan / Recommendation Clinical Impression  Pt tolerating cuff deflated since attempt by SLP this morning. PT continues to require encouragement and coaxing to participate in trials of finger occlusion or PMSV placement. Pt frequently requests suctioning, but demonstrates a very strong productive cough. Pt indicated willingness to try finger occlusion - voice quality was gravelly and strained. PMSV placed for <30 seconds, with slight increase in O2 sats noted, followed by strong cough with forceful removal of speaking valve. Increase in secretions noted, followed by vomiting. Pt used head shakes and hand gestures to indicate "no" to trying valve again. PMSV placed in holding container and taped to wall in its bag with a note for ST only. SLP will continue to follow for PMSV trials and BSE when appropriate.   SLP Visit Diagnosis: Aphonia (R49.1)    SLP Assessment  Patient needs continued Speech Language Pathology Services    Follow Up Recommendations  (TBD)    Frequency and Duration min 2x/week  2 weeks    PMSV Trial PMSV was placed for: a few seconds, pt coughed it off Able to redirect subglottic air through upper airway: Yes Able to Attain Phonation: Yes Voice Quality: (gravelly) Able to Expectorate Secretions: Yes Level of Secretion Expectoration with PMSV: Tracheal Breath Support for Phonation: Inadequate   Tracheostomy Tube    #6 Shiley, cuffed   Vent Dependency  FiO2 (%): 28 %    Cuff Deflation Trial  GO  Hiawatha Dressel B. Murvin Natal, MSP, CCC-SLP Speech Language Pathologist Office: [phone]  Tolerated Cuff Deflation: Yes Length of Time for Cuff Deflation Trial: cuff deflated upon arrival of sLP Behavior: Alert;Anxious;Trembling        Leigh Aurora 02/08/2020, 3:21 PM

## 2020-02-09 LAB — COMPREHENSIVE METABOLIC PANEL
ALT: 27 U/L (ref 0–44)
AST: 25 U/L (ref 15–41)
Albumin: 2.3 g/dL — ABNORMAL LOW (ref 3.5–5.0)
Alkaline Phosphatase: 127 U/L — ABNORMAL HIGH (ref 38–126)
Anion gap: 12 (ref 5–15)
BUN: 16 mg/dL (ref 6–20)
CO2: 28 mmol/L (ref 22–32)
Calcium: 8.9 mg/dL (ref 8.9–10.3)
Chloride: 97 mmol/L — ABNORMAL LOW (ref 98–111)
Creatinine, Ser: 0.65 mg/dL (ref 0.44–1.00)
GFR calc Af Amer: >60 mL/min (ref >60)
GFR calc non Af Amer: >60 mL/min (ref >60)
Glucose, Bld: 110 mg/dL — ABNORMAL HIGH (ref 70–99)
Potassium: 4.5 mmol/L (ref 3.5–5.1)
Sodium: 137 mmol/L (ref 135–145)
Total Bilirubin: 0.3 mg/dL (ref 0.3–1.2)
Total Protein: 7.4 g/dL (ref 6.5–8.1)

## 2020-02-09 LAB — GLUCOSE, CAPILLARY
Glucose-Capillary: 138 mg/dL — ABNORMAL HIGH (ref 70–99)
Glucose-Capillary: 106 mg/dL — ABNORMAL HIGH (ref 70–99)
Glucose-Capillary: 103 mg/dL — ABNORMAL HIGH (ref 70–99)
Glucose-Capillary: 119 mg/dL — ABNORMAL HIGH (ref 70–99)
Glucose-Capillary: 127 mg/dL — ABNORMAL HIGH (ref 70–99)
Glucose-Capillary: 109 mg/dL — ABNORMAL HIGH (ref 70–99)

## 2020-02-09 LAB — CBC
HCT: 33.6 % — ABNORMAL LOW (ref 36.0–46.0)
Hemoglobin: 10.6 g/dL — ABNORMAL LOW (ref 12.0–15.0)
MCH: 26.4 pg (ref 26.0–34.0)
MCHC: 31.5 g/dL (ref 30.0–36.0)
MCV: 83.6 fL (ref 80.0–100.0)
Platelets: 751 10*3/uL — ABNORMAL HIGH (ref 150–400)
RBC: 4.02 MIL/uL (ref 3.87–5.11)
RDW: 15 % (ref 11.5–15.5)
WBC: 12.1 10*3/uL — ABNORMAL HIGH (ref 4.0–10.5)
nRBC: 0 % (ref 0.0–0.2)

## 2020-02-09 MED ORDER — VITAL AF 1.2 CAL PO LIQD: 1000.0000 mL | ORAL | Status: DC

## 2020-02-09 MED ORDER — CHLORHEXIDINE GLUCONATE 0.12 % MT SOLN
OROMUCOSAL | Status: AC
  Filled 2020-02-09: qty 15

## 2020-02-09 MED ORDER — METOPROLOL TARTRATE 25 MG PO TABS
25.0000 mg | ORAL_TABLET | Freq: Two times a day (BID) | ORAL | Status: DC
  Administered 2020-02-09 – 2020-02-10 (×3): 25 mg
  Filled 2020-02-09 (×3): qty 1

## 2020-02-09 NOTE — Progress Notes (Signed)
NAME:  Darlene Castillo, MRN:  [id number], DOB:  02/26/1961, LOS: 22 ADMISSION DATE:  01/18/2020, CONSULTATION DATE:  01/18/20 REFERRING MD:  Harriette Bouillon CHIEF COMPLAINT:  VDRF, septic shock  Brief History   59 y/o F admitted 5/5 with abdominal pain, constipation and one episode of vomiting. Found to have a perforated sigmoid colon with peritonitis.  To OR for exploratory laparotomy with partial colectomy and end colostomy (negative pathology), returned to ICU post-operatively on mechanical ventilation. Prolonged respiratory failure s/p tracheostomy 5/19.  Difficult to control agitation / pain.    Past Medical History  Chronic opiate use Tobacco abuse DM HTN  Significant Hospital Events   5/05 Admit, ex lap with partial colectomy and end colostomy for perf:  neg path 5/11 Extubated 5/12 Failed extubation.  Initially DNI but reversed.   5/13  Back on vent. Fever curve better. Concern for A Fib but EKG sinus tach with PAC. Down to 40% fio2 . TF on hold but getting meds via NG. On TPN. On dilaudid gtt, scheduled methadone, versed gtt, clonidine, seroquel and nicotine patch. Started on IV heparin for RUE DVT  5/14 dc versed gtt. Working on weaning sedation. Changing abx to rocephin 5/15 Methadone held at family request  5/16 Restarted methadone due to significant agitation 5/18 Agitation overnight, sedation gtt's increased 5/19 Trach.  Dilaudid stopped. Intra stomal enema  5/20 Sedation reduction  5/21 On PSV wean 10/5.  TF on hold with emesis overnight.  5/22 PSV wean, diuresis with 1.5L UOP.  Nausea.  5/23 PRVC 1 unit for Hgb 6.9 5/24 Up in chair, weaned on PSV  5/25 PSV wean ~ 4 hours, then ATC ~ 8 hours 5/26 PSV to open ended ATC   Consults:  [facility] PCCM  Procedures:  R IJ CVL 5/5 >> 5/6 ALine 5/5 >> 5/7  ETT 5/5 >> 5/11, replaced 5/12 >> 5/19  Trach 5/19 >> LUE PICC 5/6 >>  Significant Diagnostic Tests:   CT abdomen pelvis with contrast 01/17/2020 >> scattered groundglass  opacities in the lower lungs bilaterally, no focal opacities.  Abdominal free air.  Multiple dilated loops of bowel with stool.  Large R renal cyst.  Path 5/5 >> Perforation with acute inflammation and acute serositis. No evidence of malignancy.    [place] UE 5/13 >> RUE DVT  CT ABD / Pelvis w Contrast 5/17 >> increasingly organized apearance of LLQ & pelvic fluid with slight decrease in the deep pelvic fluid.  Slight increase in LLQ fluid. Interloop fluid in the left hemiabdomen is slightly increased overall.  Diffuse interloop fluid and mesenteric stranding is slightly diminished. Persistent small areas of loculated perihepatic fluid unchanged, largest area in the axial plane measureing 2.5 x 1.7 cm. Decreased formed stool in the colon but with slight increase in colonic distention particularly of the transverse colon.  May reflect colonic ileus.  Persistent bibasilar consolidation and pleural effusions without significant change.   CT Sinus 5/20 >> sinus mucosal edema, bilateral mastoid effusions, no air fluid levels, no abscess in neck  Micro Data:  Covid 5/5 >> negative Flu PCR 5/5 >> negative MRSA PCR 5/12 >> neg Tracheal aspirate 5/12 >> klebsiella > R ampicillin, Unasyn, I: zosyn Urine analysis 5/12 >> negative  Blood culture 5/12 >> negative  UC 5/19 >> negative  BCx2 5/18 >> negative  Tracheal Aspirate 5/20 >> pseudomonas aeruginosa >> I-ceftazidime, cefepime.  S-imipenem, cipro Tracheal Aspirate 5/27 >>   Antimicrobials:  Zosyn 5/5 >> 5/14 Eraxis 5/12 >> 5/14 Rocephin  5/14 >> Eraxis 5/18 >> 5/20 Zosyn 5/19 >> 5/19 Cefepime 5/20 >> 5/22 Meropenem 5/22 >>  Interim history/subjective:  Remained off vent overnight  Tmax 101.2 / WBC 12.1  Glucose range 106 -150  I/O 2.2L UOP, +131ml in last 24 hours  Remains off precedex  Pt denies pain / SOB Repeat tracheal aspirate sent overnight  Objective   Blood pressure (!) 160/82, pulse (!) 112, temperature 99.4 F (37.4 C),  temperature source Axillary, resp. rate (!) 27, height 5\' 1"  (1.549 m), weight 63 kg, last menstrual period 05/23/2013, SpO2 95 %.    Vent Mode: PSV;CPAP FiO2 (%):  [28 %-35 %] 28 % PEEP:  [5 cmH20] 5 cmH20 Pressure Support:  [5 cmH20] 5 cmH20   Intake/Output Summary (Last 24 hours) at 02/09/2020 0800 Last data filed at 02/09/2020 0600 Gross per 24 hour  Intake 2511.91 ml  Output 2500 ml  Net 11.91 ml   Filed Weights   02/07/20 0500 02/08/20 0402 02/09/20 0500  Weight: 63.1 kg 63 kg 63 kg   Exam: General: adult female lying in bed in NAD  HEENT: MM pink/moist, trach midline c/d/i, ATC, wearing glasses, anicteric  Neuro: Awake/alert, follows commands, MAE, writes messages appropriately  CV: s1s2 RRR, tachy, no m/r/g PULM:  Non-labored on ATC, thin secretions from trach, clear breath sounds bilaterally  GI: soft, bsx4 active, midline dressing intact, LLQ ostomy Extremities: warm/dry, no edema  Skin: no rashes or lesions  Resolved problems  Circulatory/septic shock Mild Elevation of LFT's  Acute Metabolic Encephalopathy, started 5/12- resolved 5//24  Assessment & Plan:  LOS 22 days   Acute hypoxic respiratory failure requiring mechanical ventilation, complicated by Klebsiella PNA.  Treated with 8 days of cefepime for PNA.  Pseudomonas in tracheal aspirate, ? Colonization with "few" growing.  -open ended ATC as tolerated -continue meropenm for pseudomonas, D6/x -follow intermittent CXR -OOB / mobilize  -continue SLP efforts for swallowing, PMV (reviewed with [facility]CS)  Chronic Pain, on oral methadone PTA Difficulties with intermittent agitation, family requested for patient to come off home medications, agitated delirium worsened. Improved 5/17 back on home methadone.   -continue klonopin taper -continue seroquel, melatonin QHS -methadone per CCS  Peritonitis 2/2 Sigmoid Colon Perforation from Chronic Constipation s/p ex lap with sigmoid resection and colostomy. Completed 10  days abx for initial peritonitis. Required enema for constipation 5/19. -post-op care per CCS  -follow ostomy output  -pain control per [facility]  Right Brachial DVT -eliquis for DVT   Emesis -PRN zofran    Moderate Malnutrition Expected prolonged ileus post- op, which is improving. -TF per Nutrition / CCS   Intermittent Fluid and Electrolyte imbalance - hypokalemia  -monitor, replace as indicated   Hyperglycemia -SSI  Anemia  Suspect multifactorial in setting of critical illness, recurrent phlebotomy. No acute evidence of bleeding  -trend CBC  -transfuse for Hgb <7%  Intermittent Fevers Persistent intermittent fever despite abx.  DVT identified and being treated with full dose anticoagulation. Consider right chest effusion, ongoing constipation, drug fever, sinusitis. GB normal on CT imaging, less likely acalculous cholecystitis. CT sinuses discussed with radiology > appears like retained mucus in mastoid sinuses, air fluid levels expected with NGT in place, non-specific findings. Eraxis stopped with risk of drug fever.  New pseudomonas in tracheal aspirate, ? Colonization given number of colonies  -abx as above  -minimize lines, no foley   Best practice:  Diet: NPO; TF Pain/Anxiety/Delirium protocol (if indicated): see above VAP protocol (if indicated): In place DVT prophylaxis:  Eliquis  GI prophylaxis: pepcid Glucose control: SSI Mobility: bedrest Code Status: full code Family Communication: Patient updated on plan of care 5/27.  Mother called but no answer / message left. Disposition: ICU   BMP Latest Ref Rng & Units 02/09/2020 02/08/2020 02/07/2020  Glucose 70 - 99 mg/dL 110(H) 128(H) 149(H)  BUN 6 - 20 mg/dL 16 16 15   Creatinine 0.44 - 1.00 mg/dL 0.65 0.51 0.52  Sodium 135 - 145 mmol/L 137 139 140  Potassium 3.5 - 5.1 mmol/L 4.5 3.5 3.7  Chloride 98 - 111 mmol/L 97(L) 101 104  CO2 22 - 32 mmol/L 28 28 27   Calcium 8.9 - 10.3 mg/dL 8.9 8.7(L) 8.8(L)    CBC      Component Value Date/Time   WBC 12.1 (H) 02/09/2020 0640   RBC 4.02 02/09/2020 0640   HGB 10.6 (L) 02/09/2020 0640   HCT 33.6 (L) 02/09/2020 0640   PLT 751 (H) 02/09/2020 0640   MCV 83.6 02/09/2020 0640   MCH 26.4 02/09/2020 0640   MCHC 31.5 02/09/2020 0640   RDW 15.0 02/09/2020 0640   LYMPHSABS 1.9 02/06/2020 0300   MONOABS 1.0 02/06/2020 0300   EOSABS 0.2 02/06/2020 0300   BASOSABS 0.0 02/06/2020 0300    Critical Care Time: n/a  Noe Gens, MSN, NP-C [facility] 02/09/2020, 8:00 AM   Please see [email or web address] for pager details.

## 2020-02-09 NOTE — Progress Notes (Signed)
Physical Therapy Treatment Patient Details Name: Darlene Castillo MRN: [id number] DOB: Aug 12, 1961 Today's Date: 02/09/2020    History of Present Illness 59 yo female admitted 01/18/20 with the diagnosis of pneumoperitoneum, peritonitis,  perforated sigmoid colon status post ex lap with colectomy and ostomy remains on the ventilator/trach.  Unable to wean successfully d/t ARF with hypoxemia in setting of major abdominal surgery and acute pulmonary edema.PMH: tobacco use, HTN    PT Comments    The patient is  Participating in mobility, min assist for bed and 2 mod assist for stand and pivot. Patient did demonstrate improved standing  And supporting self this visit. Patient is an excellent CIR candidate. Patient is now weaned to 28% TC. SPO2 > 92% with activity, HR 110. RR26. Continue progressive mobility and begin ambulation next vist as tolerated.  Follow Up Recommendations  CIR(vs snf/HH)     Equipment Recommendations  (TBA)    Recommendations for Other Services Rehab consult     Precautions / Restrictions Precautions Precautions: Fall Precaution Comments: trach collar 28%, , multiple lines, abd wound, colostomy, NG    Mobility  Bed Mobility Overal bed mobility: Needs Assistance Bed Mobility: Supine to Sit Rolling: Min assist         General bed mobility comments: extra time, light 1 UE support for trunk to sit up, moved legs without assistance  Transfers Overall transfer level: Needs assistance Equipment used: 2 person hand held assist;Rolling walker (2 wheeled) Transfers: Sit to/from [facility] Sit to Stand: Mod assist;+2 safety/equipment Stand pivot transfers: Mod assist;+2 safety/equipment       General transfer comment: held 1 HHA and other on RW, assist to move RW,encouraged to use the RW, Able to take steops to the recliner.  Ambulation/Gait                 Stairs             Wheelchair Mobility    Modified Rankin (Stroke  Patients Only)       Balance Overall balance assessment: Needs assistance Sitting-balance support: Bilateral upper extremity supported;Feet supported Sitting balance-Leahy Scale: Fair Sitting balance - Comments: noted trunk sways, reorts feeling too medicated   Standing balance support: Bilateral upper extremity supported;During functional activity Standing balance-Leahy Scale: Poor Standing balance comment: reliant on UE support                            Cognition Arousal/Alertness: Awake/alert Behavior During Therapy: WFL for tasks assessed/performed Overall Cognitive Status: Difficult to assess                                 General Comments: communictaes appropriately with writing and gestures, cooperative today      Exercises General Exercises - Lower Extremity Ankle Circles/Pumps: AROM;Both;10 reps;Seated Quad Sets: AROM;10 reps;Both;Supine Heel Slides: AROM;10 reps;Supine;Both Hip ABduction/ADduction: 10 reps;AROM;Both;Supine    General Comments        Pertinent Vitals/Pain Pain Score: 5  Pain Location: abdominal discomfort Pain Descriptors / Indicators: Grimacing Pain Intervention(s): Monitored during session;Premedicated before session    Home Living                      Prior Function            PT Goals (current goals can now be found in the care plan section) Progress towards PT goals:  Progressing toward goals    Frequency    Min 3X/week      PT Plan Discharge plan needs to be updated;Frequency needs to be updated    Co-evaluation              AM-PAC PT "6 Clicks" Mobility   Outcome Measure  Help needed turning from your back to your side while in a flat bed without using bedrails?: A Little Help needed moving from lying on your back to sitting on the side of a flat bed without using bedrails?: A Little Help needed moving to and from a bed to a chair (including a wheelchair)?: A Lot Help needed  standing up from a chair using your arms (e.g., wheelchair or bedside chair)?: A Lot   Help needed climbing 3-5 steps with a railing? : Total 6 Click Score: 11    End of Session   Activity Tolerance: Patient tolerated treatment well Patient left: in chair;with call bell/phone within reach;with nursing/sitter in room Nurse Communication: Mobility status PT Visit Diagnosis: Other abnormalities of gait and mobility (R26.89);Muscle weakness (generalized) (M62.81)     Time: 7903-8333 PT Time Calculation (min) (ACUTE ONLY): 26 min  Charges:  $Therapeutic Exercise: 8-22 mins $Therapeutic Activity: 8-22 mins                     Blanchard Kelch PT Acute Rehabilitation Services Pager [phone] Office [phone]    Rada Hay 02/09/2020, 3:53 PM

## 2020-02-09 NOTE — Progress Notes (Signed)
  Speech Language Pathology Treatment: Darlene Castillo Speaking valve  Patient Details Name: Darlene Castillo MRN: [id number] DOB: [birth year] Today's Date: 02/09/2020 Time: 5397-6734 SLP Time Calculation (min) (ACUTE ONLY): 20 min  Assessment / Plan / Recommendation Clinical Impression  Today pt with improved tolerance of trach finger occlusion and PMSV trials.  RN present during session and supported pt - provided tracheal suctioning as pt with mild congestive breathing proximal chest.  Finger occlusion x4 conducted with pt voicing and coughing with moderate cues for timing and strength. Voice is strained with decreased proximal air movement but significant attempts made by pt.  SLP then placed PMSV for voicing trials. Pt tolerates valve for approx 1.5 minutes - with removal of valve significant air trapping noted however.   SLP reinforced pt's progress today in attempts to which she wrote "It's my first day doing these things".  Recommend continue efforts for PMSV for 1.5 minutes max with staffing only.  Will continue treatment but anticipate pt's swallowing may improve at more rapid rate than tolerance of PMSV.  Signs created for pt's room = SLP to continue treatment. Pt will benefit from aggressive rehab efforts for voice/swallowing.  She is effectively communicating via writing and declined offer for communication board.      HPI HPI: 59yo female admitted 01/18/20 with acute worsening of abdominal pain. 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  Pt with perforation of sigmoid colon requiring surgery.  Required intubation - now with trach  - off vent for a day - and has large bore NG tube in place.  PMSV trials started yesterday and swallow evaluation desired.      SLP Plan  Continue with current plan of care       Recommendations         Patient may use Passy-Muir Speech Valve: with SLP only(few minutes with staff only) PMSV Supervision: Full MD: Please consider changing  trach tube to : Smaller size;Cuffless         Follow up Recommendations: LTACH(TBD) SLP Visit Diagnosis: Aphonia (R49.1) Plan: Continue with current plan of care       GO                [name] 02/09/2020, 7:21 PM   [name], MS [facility] SLP Acute Rehab Services Office [phone]

## 2020-02-09 NOTE — Evaluation (Signed)
Clinical/Bedside Swallow Evaluation Patient Details  Name: Darlene Castillo MRN: [id number] Date of Birth: [birth year]  Today's Date: 02/09/2020 Time: SLP Start Time (ACUTE ONLY): 1630 SLP Stop Time (ACUTE ONLY): 1645 SLP Time Calculation (min) (ACUTE ONLY): 15 min  Past Medical History:  Past Medical History:  Diagnosis Date  . Blurred vision   . Cervicalgia   . Chronic pain   . Diabetes mellitus without complication (HCC)    diet controlled borderline  . Headache   . Hypertension   . Numbness and tingling    Past Surgical History:  Past Surgical History:  Procedure Laterality Date  . CESAREAN SECTION    . COLON RESECTION N/A 01/18/2020   Procedure: EXPLORATORY LAPAROTOMY SIGMOID RESECTION AND COLOSTOMY;  Surgeon: Harriette Bouillon, MD;  Location: [facility] ORS;  Service: General;  Laterality: N/A;  . MYOMECTOMY    . wisdoms teeth extracted     HPI:  59yo female admitted 01/18/20 with acute worsening of abdominal pain. 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  Pt with perforation of sigmoid colon requiring surgery.  Required intubation - now with trach  - off vent for a day - and has large bore NG tube in place.  PMSV trials started yesterday and swallow evaluation desired.   Assessment / Plan / Recommendation Clinical Impression  Pt today with NG in place and thus minimal intake of single ice chips provided.  Pt cranial nerve exam unremarkable - except strained voice with finger occlusion.  Surprisingly pt able to perform swallow with and without ice.  No overt indication of aspiration and swallow clinically was timely. However given size of NG likley negatively impacting epiglottic deflection, airway protection - no further pos offered.  Recommend proceed with MBS next date with NG removed to allow optimal opportunity for po intake.  If pt's bowel issues have resolved, would advise small bore tube if pt needs nutrition for improved secretion and airway management.  MBS  indicated as pt may have significant sensori-motor impairments due to possible desensitization of pharynx due to NG and tracheostomy tube and secretion retention.  Recommend continue single ice chips pending instrumental evaluation.    RN and pt informed of recommendations and order placed for MBS.   SLP Visit Diagnosis: Dysphagia, unspecified (R13.10);Dysphagia, pharyngoesophageal phase (R13.14)    Aspiration Risk  Moderate aspiration risk    Diet Recommendation Ice chips PRN after oral care        Other  Recommendations     Follow up Recommendations LTACH;Skilled Nursing facility      Frequency and Duration min 2x/week  2 weeks       Prognosis Prognosis for Safe Diet Advancement: Fair Barriers to Reach Goals: Time post onset      Swallow Study   General Date of Onset: 02/09/20 HPI: 59yo female admitted 01/18/20 with acute worsening of abdominal pain. 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  Pt with perforation of sigmoid colon requiring surgery.  Required intubation - now with trach  - off vent for a day - and has large bore NG tube in place.  PMSV trials started yesterday and swallow evaluation desired. Type of Study: Bedside Swallow Evaluation Previous Swallow Assessment: none Diet Prior to this Study: NPO Temperature Spikes Noted: Yes Respiratory Status: Trach Collar(15) History of Recent Intubation: Yes Behavior/Cognition: Alert;Cooperative;Pleasant mood Oral Cavity Assessment: Within Functional Limits Oral Care Completed by SLP: No Oral Cavity - Dentition: Edentulous Patient Positioning: Upright in bed  Baseline Vocal Quality: Other (comment)(strained vocal quality with trach occlusion) Volitional Cough: Strong Volitional Swallow: Able to elicit    Oral/Motor/Sensory Function Overall Oral Motor/Sensory Function: Within functional limits   Ice Chips Ice chips: Within functional limits Presentation: Spoon   Thin Liquid Thin Liquid: Not  tested    Nectar Thick Nectar Thick Liquid: Not tested   Honey Thick Honey Thick Liquid: Not tested   Puree Puree: Not tested   Solid     Solid: Not tested      Macario Golds 02/09/2020,7:40 PM  Kathleen Lime, MS [facility] Office [phone]

## 2020-02-09 NOTE — TOC Progression Note (Signed)
Transition of Care [facility]) - Progression Note    Patient Details  Name: Darlene Castillo MRN: [id number] Date of Birth: 1960-11-24  Transition of [facility]) CM/SW Contact  Golda Acre, RN Phone Number: 02/09/2020, 7:47 AM  Clinical Narrative:    Patient switched to trach collar 28-35% on 052621, tolerating well nocturnal vent, pt does not want to go lotach if possible.  s agreeable to snf if she wean from the nocturnal vent and hopefully trach. Alert and reactive, iv precedex for rest, iv merrem,  Temp is 101.3 wbc 12.1   Expected Discharge Plan: Skilled Nursing Facility Barriers to Discharge: Continued Medical Work up  Expected Discharge Plan and Services Expected Discharge Plan: Skilled Nursing Facility       Living arrangements for the past 2 months: Single Family Home                                       Social Determinants of Health (SDOH) Interventions    Readmission Risk Interventions No flowsheet data found.

## 2020-02-09 NOTE — Progress Notes (Addendum)
Patient ID: Darlene Castillo, female   DOB: 02/24/1961, 59 y.o.   MRN: [id number]    22 Days Post-Op  Subjective:  Patient has been on trach collar all night.  Did not tolerate PMV well yesterday.  Had a lot of secretions.  Tolerated TFs at 50cc/hr.  ROS: See above, otherwise other systems negative  Objective: Vital signs in last 24 hours: Temp:  [98.7 F (37.1 C)-101.2 F (38.4 C)] 98.8 F (37.1 C) (05/27 0800) Pulse Rate:  [92-130] 112 (05/27 0500) Resp:  [21-31] 27 (05/27 0500) BP: (160-185)/(72-87) 160/82 (05/27 0510) SpO2:  [91 %-98 %] 95 % (05/27 0743) FiO2 (%):  [28 %-35 %] 28 % (05/27 0743) Weight:  [63 kg] 63 kg (05/27 0500) Last BM Date: 02/09/20(per colostomy)  Intake/Output from previous day: 05/26 0701 - 05/27 0700 In: 2761.4 [I.V.:1216.3; NG/GT:1136.8; IV Piggyback:408.3] Out: 2650 [Urine:2200; Stool:450] Intake/Output this shift: No intake/output data recorded.  PE: Heart: regular Lungs: trach in place, sats good Abd: soft, appropriately tender, midline wound is clean and packed.  +BS, colostomy with good liquid output.  Stoma is pink and viable.  NGT in place with TFs running  Lab Results:  Recent Labs    02/07/20 0450 02/09/20 0640  WBC 11.4* 12.1*  HGB 9.7* 10.6*  HCT 31.3* 33.6*  PLT 778* 751*   BMET Recent Labs    02/08/20 0500 02/09/20 0640  NA 139 137  K 3.5 4.5  CL 101 97*  CO2 28 28  GLUCOSE 128* 110*  BUN 16 16  CREATININE 0.51 0.65  CALCIUM 8.7* 8.9   PT/INR No results for input(s): LABPROT, INR in the last 72 hours. CMP     Component Value Date/Time   NA 137 02/09/2020 0640   K 4.5 02/09/2020 0640   CL 97 (L) 02/09/2020 0640   CO2 28 02/09/2020 0640   GLUCOSE 110 (H) 02/09/2020 0640   BUN 16 02/09/2020 0640   CREATININE 0.65 02/09/2020 0640   CALCIUM 8.9 02/09/2020 0640   PROT 7.4 02/09/2020 0640   ALBUMIN 2.3 (L) 02/09/2020 0640   AST 25 02/09/2020 0640   ALT 27 02/09/2020 0640   ALKPHOS 127 (H) 02/09/2020 0640    BILITOT 0.3 02/09/2020 0640   GFRNONAA >60 02/09/2020 0640   GFRAA >60 02/09/2020 0640   Lipase     Component Value Date/Time   LIPASE 19 01/18/2020 0525       Studies/Results: No results found.  Anti-infectives: Anti-infectives (From admission, onward)   Start     Dose/Rate Route Frequency Ordered Stop   02/04/20 1400  meropenem (MERREM) 1 g in sodium chloride 0.9 % 100 mL IVPB     1 g 200 mL/hr over 30 Minutes Intravenous Every 8 hours 02/04/20 1245     02/02/20 0800  ceFEPIme (MAXIPIME) 2 g in sodium chloride 0.9 % 100 mL IVPB  Status:  Discontinued     2 g 200 mL/hr over 30 Minutes Intravenous Every 8 hours 02/02/20 0727 02/04/20 1245   02/01/20 1200  anidulafungin (ERAXIS) 100 mg in sodium chloride 0.9 % 100 mL IVPB  Status:  Discontinued     100 mg 78 mL/hr over 100 Minutes Intravenous Every 24 hours 01/31/20 1034 02/02/20 0942   02/01/20 0800  piperacillin-tazobactam (ZOSYN) IVPB 3.375 g  Status:  Discontinued     3.375 g 12.5 mL/hr over 240 Minutes Intravenous Every 8 hours 02/01/20 0735 02/02/20 0721   01/31/20 1200  anidulafungin (ERAXIS) 200 mg in sodium chloride  0.9 % 200 mL IVPB     200 mg 78 mL/hr over 200 Minutes Intravenous  Once 01/31/20 1034 01/31/20 1730   01/27/20 1200  cefTRIAXone (ROCEPHIN) 2 g in sodium chloride 0.9 % 100 mL IVPB  Status:  Discontinued     2 g 200 mL/hr over 30 Minutes Intravenous Every 24 hours 01/27/20 1041 02/01/20 0735   01/26/20 1000  anidulafungin (ERAXIS) 100 mg in sodium chloride 0.9 % 100 mL IVPB  Status:  Discontinued     100 mg 78 mL/hr over 100 Minutes Intravenous Every 24 hours 01/25/20 0956 01/27/20 1041   01/25/20 1100  anidulafungin (ERAXIS) 200 mg in sodium chloride 0.9 % 200 mL IVPB     200 mg 78 mL/hr over 200 Minutes Intravenous  Once 01/25/20 1006 01/25/20 1525   01/18/20 1400  piperacillin-tazobactam (ZOSYN) IVPB 3.375 g  Status:  Discontinued     3.375 g 12.5 mL/hr over 240 Minutes Intravenous Every 8 hours  01/18/20 1311 01/27/20 1041   01/18/20 0715  piperacillin-tazobactam (ZOSYN) IVPB 3.375 g     3.375 g 100 mL/hr over 30 Minutes Intravenous  Once 01/18/20 0707 01/18/20 0837       Assessment/Plan HTN Prediabetic- A1c6.3,SSI Tobacco abuse 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  On Merrem for pseudomonal PNA Fever -Tmax 101.2 overnight.  New trach culture with yeast, defer to CCM on tx plan for this.  Blood cx in process as is urine cx Anemia- tx 1 unit of pRBCs 5/18, 5/23. stable R brachial DVT- eliquis  Septic shock Sigmoid colon perforation secondary to chronic constipation and ulceration with feculent peritonitis and gross contamination of abdominal cavity with stool balls -S/pExploratory laparotomy with partial colectomy and end colostomy5/5 Dr. Brantley Stage             - POD#22             - surgical path:Perforation with acute inflammation and acute serositis, no evidence of malignancy             -NS WD dressing changes daily             - WOC RNfollowingfor new colostomy             - increase TFs to 60cc/hr today.  Cont with zofran and compazine for nausea.  Unclear if she may have some gastroparesis or something like that as her bowel is working well and she should be able to tolerate TFs              -MBS when able per speech              -patient is surgically stable  ID - Merrem - PNA FEN -NGT/TFs VTE -SCDs, eliquis Foley -d/c 5/13 Dispo - patient seems to be progressing on trach collar, would like to go home with her mother   LOS: 38 days    Darlene Castillo , [facility] Surgery 02/09/2020, 8:25 AM Please see Amion for pager number during day hours 7:00am-4:30pm or 7:00am -11:30am on weekends  Agree with above. The patient is generally  progressing in the right direction.  Darlene Overall, MD, [facility] Surgery Office phone:  [phone]

## 2020-02-10 ENCOUNTER — Inpatient Hospital Stay (HOSPITAL_COMMUNITY): Payer: BLUE CROSS/BLUE SHIELD

## 2020-02-10 DIAGNOSIS — Z930 Tracheostomy status: Secondary | ICD-10-CM

## 2020-02-10 DIAGNOSIS — J189 Pneumonia, unspecified organism: Secondary | ICD-10-CM

## 2020-02-10 LAB — CBC
HCT: 37.3 % (ref 36.0–46.0)
Hemoglobin: 10.8 g/dL — ABNORMAL LOW (ref 12.0–15.0)
MCH: 25.4 pg — ABNORMAL LOW (ref 26.0–34.0)
MCHC: 29 g/dL — ABNORMAL LOW (ref 30.0–36.0)
MCV: 87.6 fL (ref 80.0–100.0)
Platelets: 730 10*3/uL — ABNORMAL HIGH (ref 150–400)
RBC: 4.26 MIL/uL (ref 3.87–5.11)
RDW: 15 % (ref 11.5–15.5)
WBC: 11.3 10*3/uL — ABNORMAL HIGH (ref 4.0–10.5)
nRBC: 0 % (ref 0.0–0.2)

## 2020-02-10 LAB — BASIC METABOLIC PANEL
Anion gap: 10 (ref 5–15)
BUN: 20 mg/dL (ref 6–20)
CO2: 32 mmol/L (ref 22–32)
Calcium: 8.9 mg/dL (ref 8.9–10.3)
Chloride: 97 mmol/L — ABNORMAL LOW (ref 98–111)
Creatinine, Ser: 0.55 mg/dL (ref 0.44–1.00)
GFR calc Af Amer: >60 mL/min (ref >60)
GFR calc non Af Amer: >60 mL/min (ref >60)
Glucose, Bld: 106 mg/dL — ABNORMAL HIGH (ref 70–99)
Potassium: 4.1 mmol/L (ref 3.5–5.1)
Sodium: 139 mmol/L (ref 135–145)

## 2020-02-10 LAB — URINE CULTURE: Culture: <10000 — AB

## 2020-02-10 LAB — GLUCOSE, CAPILLARY
Glucose-Capillary: 105 mg/dL — ABNORMAL HIGH (ref 70–99)
Glucose-Capillary: 108 mg/dL — ABNORMAL HIGH (ref 70–99)
Glucose-Capillary: 118 mg/dL — ABNORMAL HIGH (ref 70–99)
Glucose-Capillary: 96 mg/dL (ref 70–99)
Glucose-Capillary: 98 mg/dL (ref 70–99)

## 2020-02-10 MED ORDER — BOOST / RESOURCE BREEZE PO LIQD CUSTOM
1.0000 | Freq: Three times a day (TID) | ORAL | Status: DC
  Administered 2020-02-13 – 2020-02-15 (×4): 1 via ORAL

## 2020-02-10 MED ORDER — MELATONIN 3 MG PO TABS
3.0000 mg | ORAL_TABLET | Freq: Every day | ORAL | Status: DC
  Administered 2020-02-10 – 2020-02-16 (×5): 3 mg via ORAL
  Filled 2020-02-10 (×6): qty 1

## 2020-02-10 MED ORDER — METOPROLOL TARTRATE 25 MG PO TABS
25.0000 mg | ORAL_TABLET | Freq: Two times a day (BID) | ORAL | Status: DC
  Administered 2020-02-10 – 2020-02-16 (×13): 25 mg via ORAL
  Filled 2020-02-10 (×13): qty 1

## 2020-02-10 MED ORDER — QUETIAPINE FUMARATE 25 MG PO TABS
50.0000 mg | ORAL_TABLET | Freq: Every day | ORAL | Status: DC
  Administered 2020-02-12 – 2020-02-16 (×5): 50 mg via ORAL
  Filled 2020-02-10: qty 2
  Filled 2020-02-10: qty 1
  Filled 2020-02-10 (×4): qty 2

## 2020-02-10 MED ORDER — METHADONE HCL 10 MG PO TABS
10.0000 mg | ORAL_TABLET | Freq: Four times a day (QID) | ORAL | Status: DC
  Administered 2020-02-10 – 2020-02-17 (×27): 10 mg via ORAL
  Filled 2020-02-10: qty 1
  Filled 2020-02-10: qty 2
  Filled 2020-02-10 (×3): qty 1
  Filled 2020-02-10 (×5): qty 2
  Filled 2020-02-10: qty 1
  Filled 2020-02-10 (×7): qty 2
  Filled 2020-02-10: qty 1
  Filled 2020-02-10: qty 2
  Filled 2020-02-10: qty 1
  Filled 2020-02-10 (×5): qty 2
  Filled 2020-02-10: qty 1

## 2020-02-10 MED ORDER — NALOXEGOL OXALATE 12.5 MG PO TABS
12.5000 mg | ORAL_TABLET | Freq: Every day | ORAL | Status: DC
  Administered 2020-02-11 – 2020-02-16 (×6): 12.5 mg via ORAL
  Filled 2020-02-10 (×7): qty 1

## 2020-02-10 MED ORDER — FAMOTIDINE 20 MG PO TABS
20.0000 mg | ORAL_TABLET | Freq: Every day | ORAL | Status: DC
  Administered 2020-02-13 – 2020-02-16 (×4): 20 mg via ORAL
  Filled 2020-02-10 (×6): qty 1

## 2020-02-10 NOTE — Progress Notes (Signed)
Modified Barium Swallow Progress Note  Patient Details  Name: Darlene Castillo MRN: [id number] Date of Birth: 1960-12-23  Today's Date: 02/10/2020  Modified Barium Swallow completed.  Full report located under Chart Review in the Imaging Section.  Brief recommendations include the following:  Clinical Impression  Surprisingly pt presents with intact oropharyngeal swallow ability.  She does demonstrate delayed oral transiting but this appears to be due to apprehension (and lack of dentition) and not a true dysphagia.  Pharyngeal swallow is timely and strong without aspiration/penetration nor residuals.  SLP did not test a barium tablet due to pt's GI issues but she advises she has a hard time swallowing pills thus recommend crush or suspension by mouth.  Of note, pt did NOT have on PMSV for test due to decreased tolerance of valve but she performed well regardless - therefore no valve needed for po.  Pt coughing after testing but not during - Would advise to consider suctioning before po if gurgly breathing quality noted. Using live video, educated pt to findings/recommendations.  [name] Evaluation Recommendations       SLP Diet Recommendations: Thin liquid(clears - will follow up for advancement re: solids, ? if pt has dentures?)   Liquid Administration via: Cup;Straw   Medication Administration: Crushed with puree   Supervision: Patient able to self feed   Compensations: Slow rate;Small sips/bites   Postural Changes: Remain semi-upright after after feeds/meals (Comment);Seated upright at 90 degrees   Oral Care Recommendations: Oral care BID(suction before po if needed)      [name], MS [facility] SLP Acute Rehab Services Office [phone]   [name] 02/10/2020,11:37 AM

## 2020-02-10 NOTE — Progress Notes (Signed)
Nutrition Follow-up  DOCUMENTATION CODES:   Not applicable  INTERVENTION:  - diet advancement per Surgery team. - will provide interventions at follow-up.  NUTRITION DIAGNOSIS:   Increased nutrient needs related to acute illness as evidenced by estimated needs. -revised  GOAL:   Patient will meet greater than or equal to 90% of their needs -unable to meet at this time.   MONITOR:   Diet advancement, Labs, Weight trends  ASSESSMENT:   59 year old female with past medical history of tobacco abuse, prior history of heroin use on methadone for over the past 15 years, diet controlled DM presented with acute worsening abdominal pain, constipation, nausea with multiple episodes of emesis and poor po intake over the last 5 days. CT revealed colonic perforation in sigmoid/rectosigmoid.  Significant Events: 5/5- admission; NGT placed in R nare; ex lap with partial colectomy and end colostomy  5/6- initial RD assessment; triple lumen PICC placed in L brachial; TPN initiation 5/13- TF initiation  5/14- TPN decreased to 40 ml/hr 5/16- TPN decreased to 30 ml/hr 5/17- TF stopped and NGT to LIS overnight d/t bilious emesis 5/19- trach 5/20- vomiting episode (500 ml) and TF stopped and NGT to LIS at 0645; TPN stopped  5/22- TF re-started    Patient had MBS this AM; NGT removed prior to this. Able to talk with SLP and RN who report patient passed and did very well. Diet order for NPO still in place; will monitor for advancement and needs.   Patient very happy about passing MBS and ability to eat. She asks RD if she will need to be careful with what she eats and drinks d/t having hx of DM. Informed patient that we will closely monitor this and RD will work with her during admission and can provide education prior to d/c, if needed.   Estimated nutrition needs updated as she is now fully on trach collar and off of the vent. Weight continues to trend slightly down.    Labs reviewed; CBG: 108  mg/dl, Cl: 97 mmol/l. Medications reviewed; 40 mg pepcid/day, sliding scale novolog, 2 units novolog every 4 hours, 3 mg melatonin/day, 17 g miralax/day. IVF; D5-NS @ 50 ml/hr (204 kcal).    Diet Order:   Diet Order            Diet NPO time specified Except for: Ice Chips  Diet effective midnight              EDUCATION NEEDS:   No education needs have been identified at this time  Skin:  Skin Assessment: Skin Integrity Issues: Skin Integrity Issues:: Incisions, Wound VAC Wound Vac: being emptied MWF Incisions: abdomen (5/5)  Last BM:  5/27  Height:   Ht Readings from Last 1 Encounters:  02/04/20 5\' 1"  (1.549 m)    Weight:   Wt Readings from Last 1 Encounters:  02/10/20 61.5 kg    Estimated Nutritional Needs:  Kcal:  1845-2030 kcal Protein:  90-100 grams Fluid:  >/= 1.8 L/day     02/12/20, MS, RD, LDN, CNSC Inpatient Clinical Dietitian RD pager # available in AMION  After hours/weekend pager # available in [facility]

## 2020-02-10 NOTE — Progress Notes (Signed)
Occupational Therapy Treatment Patient Details Name: ARELY TINNER MRN: [id number] DOB: Sep 04, 1961 Today's Date: 02/10/2020    History of present illness 59 yo female admitted 01/18/20 with the diagnosis of pneumoperitoneum, peritonitis,  perforated sigmoid colon status post ex lap with colectomy and ostomy remains on the ventilator/trach.  Unable to wean successfully d/t ARF with hypoxemia in setting of major abdominal surgery and acute pulmonary edema.PMH: tobacco use, HTN   OT comments  Treatment focused on participation in self care tasks(in supine today) and sitting on edge of bed to improve activity tolerance in preparation for increasing patient's ability to perform ADLs. Vital signs remained stable during treatment.    Follow Up Recommendations  SNF;LTACH    Equipment Recommendations       Recommendations for Other Services      Precautions / Restrictions Precautions Precautions: Fall Precaution Comments: trach collar 28%, , multiple lines, abd wound, colostomy, Restrictions Weight Bearing Restrictions: No       Mobility Bed Mobility               General bed mobility comments: Patient min assist to transfer to side of bed with hand hold assistance and therapist managing lines/leads. Patient had mild posterior lean initially but recovered well to sit edge of bed.  Transfers                      Balance                                           ADL either performed or assessed with clinical judgement   ADL                                         General ADL Comments: Patient supine in bed when therapist entered the room. Patient able to remove purewick and donn slippers with assistance - force required to get heels to slip down into shoe - while still in supine.     Vision       Perception     Praxis      Cognition                                                Exercises     Shoulder  Instructions       General Comments      Pertinent Vitals/ Pain       Pain Assessment: Faces Faces Pain Scale: Hurts a little bit Pain Location: abdominal discomfort  Home Living                                          Prior Functioning/Environment              Frequency  Min 2X/week        Progress Toward Goals  OT Goals(current goals can now be found in the care plan section)        Plan Discharge plan remains appropriate    Co-evaluation      Reason for Co-Treatment: Complexity  of the patient's impairments (multi-system involvement)   OT goals addressed during session: ADL's and self-care      AM-PAC OT "6 Clicks" Daily Activity     Outcome Measure                    End of Session Equipment Utilized During Treatment: Gait belt;Rolling walker;Oxygen  OT Visit Diagnosis: Other abnormalities of gait and mobility (R26.89);Muscle weakness (generalized) (M62.81);Pain   Activity Tolerance Patient limited by fatigue   Patient Left in chair;with call bell/phone within reach   Nurse Communication Mobility status        Time: 3704-8889 OT Time Calculation (min): 38 min  Charges: OT General Charges $OT Visit: 1 Visit OT Treatments $Self Care/Home Management : 8-22 mins  Waldron Session, OTR/L Acute Care Rehab Services  Office [phone]    Kelli Churn 02/10/2020, 4:56 PM

## 2020-02-10 NOTE — Progress Notes (Signed)
NAME:  Darlene Castillo, MRN:  [id number], DOB:  1960-10-07, LOS: 23 ADMISSION DATE:  01/18/2020, CONSULTATION DATE:  01/18/20 REFERRING MD:  Erroll Luna CHIEF COMPLAINT:  VDRF, septic shock  Brief History   59 y/o F admitted 5/5 with abdominal pain, constipation and one episode of vomiting. Found to have a perforated sigmoid colon with peritonitis.  To OR for exploratory laparotomy with partial colectomy and end colostomy (negative pathology), returned to ICU post-operatively on mechanical ventilation. Prolonged respiratory failure s/p tracheostomy 5/19.  Difficult to control agitation / pain while in ICU.  Weaned from vent 5/26.    Past Medical History  Chronic opiate use Tobacco abuse DM HTN  Significant Hospital Events   5/05 Admit, ex lap with partial colectomy and end colostomy for perf:  neg path 5/11 Extubated 5/12 Failed extubation.  Initially DNI but reversed.   5/13  Back on vent. Fever curve better. Concern for A Fib but EKG sinus tach with PAC. Down to 40% fio2 . TF on hold but getting meds via NG. On TPN. On dilaudid gtt, scheduled methadone, versed gtt, clonidine, seroquel and nicotine patch. Started on IV heparin for RUE DVT  5/14 dc versed gtt. Working on weaning sedation. Changing abx to rocephin 5/15 Methadone held at family request  5/16 Restarted methadone due to significant agitation 5/18 Agitation overnight, sedation gtt's increased 5/19 Trach.  Dilaudid stopped. Intra stomal enema  5/20 Sedation reduction  5/21 On PSV wean 10/5.  TF on hold with emesis overnight.  5/22 PSV wean, diuresis with 1.5L UOP.  Nausea.  5/23 PRVC 1 unit for Hgb 6.9 5/24 Up in chair, weaned on PSV  5/25 PSV wean ~ 4 hours, then ATC ~ 8 hours 5/26 PSV to open ended ATC  5/28 Remains on ATC  Consults:  CCS PCCM  Procedures:  R IJ CVL 5/5 >> 5/6 ALine 5/5 >> 5/7  ETT 5/5 >> 5/11, replaced 5/12 >> 5/19  Trach 5/19 >> LUE PICC 5/6 >>  Significant Diagnostic Tests:   CT abdomen  pelvis with contrast 01/17/2020 >> scattered groundglass opacities in the lower lungs bilaterally, no focal opacities.  Abdominal free air.  Multiple dilated loops of bowel with stool.  Large R renal cyst.  Path 5/5 >> Perforation with acute inflammation and acute serositis. No evidence of malignancy.    [place] UE 5/13 >> RUE DVT  CT ABD / Pelvis w Contrast 5/17 >> increasingly organized apearance of LLQ & pelvic fluid with slight decrease in the deep pelvic fluid.  Slight increase in LLQ fluid. Interloop fluid in the left hemiabdomen is slightly increased overall.  Diffuse interloop fluid and mesenteric stranding is slightly diminished. Persistent small areas of loculated perihepatic fluid unchanged, largest area in the axial plane measureing 2.5 x 1.7 cm. Decreased formed stool in the colon but with slight increase in colonic distention particularly of the transverse colon.  May reflect colonic ileus.  Persistent bibasilar consolidation and pleural effusions without significant change.   CT Sinus 5/20 >> sinus mucosal edema, bilateral mastoid effusions, no air fluid levels, no abscess in neck  MBS 5/28 >>   Micro Data:  Covid 5/5 >> negative Flu PCR 5/5 >> negative MRSA PCR 5/12 >> neg Tracheal aspirate 5/12 >> klebsiella > R ampicillin, Unasyn, I: zosyn Urine analysis 5/12 >> negative  Blood culture 5/12 >> negative  UC 5/19 >> negative  BCx2 5/18 >> negative  Tracheal Aspirate 5/20 >> pseudomonas aeruginosa >> I-ceftazidime, cefepime.  S-imipenem, cipro  Tracheal Aspirate 5/27 >>  BCx1 5/28 >>   Antimicrobials:  Zosyn 5/5 >> 5/14 Eraxis 5/12 >> 5/14 Rocephin 5/14 >> Eraxis 5/18 >> 5/20 Zosyn 5/19 >> 5/19 Cefepime 5/20 >> 5/22 Meropenem 5/22 >> 5/28  Interim history/subjective:  Remains on ATC Pending SLP evaluation with MBS Afebrile / WBC 11.3  I/O 2.1L UOP, +133 ml in last 24 hours   Objective   Blood pressure (!) 158/82, pulse 88, temperature 98.3 F (36.8 C), temperature  source Oral, resp. rate 17, height 5\' 1"  (1.549 m), weight 61.5 kg, last menstrual period 05/23/2013, SpO2 97 %.    FiO2 (%):  [28 %] 28 %   Intake/Output Summary (Last 24 hours) at 02/10/2020 0849 Last data filed at 02/10/2020 02/12/2020 Gross per 24 hour  Intake 2553.08 ml  Output 2650 ml  Net -96.92 ml   Filed Weights   02/09/20 0500 02/09/20 0932 02/10/20 0500  Weight: 63 kg 63 kg 61.5 kg   Exam: General: ill appearing adult female in NAD   HEENT: MM pink/moist, trach midline c/d/i, ATC in place, wearing glasses  Neuro: Awake, alert, interactive, MAE CV: s1s2 rrr, no m/r/g PULM: non-labored on ATC, lungs clear bilaterally  GI: soft, bsx4 active, midline VAC, LLQ ostomy  Extremities: warm/dry, no edema  Skin: no rashes or lesions  Resolved problems  Circulatory/septic shock Mild Elevation of LFT's  Acute Metabolic Encephalopathy, started 5/12- resolved 5//24  Assessment & Plan:  LOS 23 days   Acute hypoxic respiratory failure requiring mechanical ventilation, complicated by Klebsiella PNA.  Treated with 8 days of cefepime for PNA.  Pseudomonas in tracheal aspirate, ? Colonization with "few" growing.  -continue open ended ATC as tolerated -PRVC 8cc/kg as rest mode if needed  -continue meropenem for pseudomonas D7/7 -OOB / mobilize  -continue SLP efforts, MBS pending.  CCS going to remove NGT  -follow intermittent CXR   Chronic Pain, on oral methadone PTA Difficulties with intermittent agitation, family requested for patient to come off home medications, agitated delirium worsened. Improved 5/17 back on home methadone.   -completed klonopin taper -continue seroquel, melatonin QHS -methadone per CCS  Peritonitis 2/2 Sigmoid Colon Perforation from Chronic Constipation s/p ex lap with sigmoid resection and colostomy. Completed 10 days abx for initial peritonitis. Required enema for constipation 5/19. -post-op care / pain control per CCS  Right Brachial DVT -continue  eliquis -consider 3 months duration for treatment   Emesis -PRN zofran   Moderate Malnutrition Expected prolonged ileus post- op, which is improving. -TF per nutrition   Intermittent Fluid and Electrolyte imbalance - hypokalemia  -monitor, replace as indicated   Hyperglycemia -SSI  Anemia  Suspect multifactorial in setting of critical illness, recurrent phlebotomy. No acute evidence of bleeding  -follow CBC -transfuse for Hgb<7%  Intermittent Fevers Persistent intermittent fever despite abx.  DVT identified and being treated with full dose anticoagulation. Consider right chest effusion, ongoing constipation, drug fever, sinusitis. GB normal on CT imaging, less likely acalculous cholecystitis. CT sinuses discussed with radiology > appears like retained mucus in mastoid sinuses, air fluid levels expected with NGT in place, non-specific findings. Eraxis stopped with risk of drug fever.  New pseudomonas in tracheal aspirate, ? Colonization given number of colonies  -completed abx as above -minimize all lines, no foley  -follow cultures  Best practice:  Diet: NPO; TF Pain/Anxiety/Delirium protocol (if indicated): see above VAP protocol (if indicated): In place DVT prophylaxis: Eliquis  GI prophylaxis: pepcid Glucose control: SSI Mobility: bedrest Code Status: full  code Family Communication: Patient updated on plan of care 5/28. Mother updated 5/27 via phone. Disposition: ICU   BMP Latest Ref Rng & Units 02/10/2020 02/09/2020 02/08/2020  Glucose 70 - 99 mg/dL 678(G) 933(Q) 826(W)  BUN 6 - 20 mg/dL 20 16 16   Creatinine 0.44 - 1.00 mg/dL 6.64 8.61  Sodium 135 - 145 mmol/L 139 137 139  Potassium 3.5 - 5.1 mmol/L 4.1 4.5 3.5  Chloride 98 - 111 mmol/L 97(L) 97(L) 101  CO2 22 - 32 mmol/L 32 28 28  Calcium 8.9 - 10.3 mg/dL 8.9 8.9 6.12)    CBC    Component Value Date/Time   WBC 11.3 (H) 02/10/2020 0258   RBC 4.26 02/10/2020 0258   HGB 10.8 (L) 02/10/2020 0258   HCT 37.3  02/10/2020 0258   PLT 730 (H) 02/10/2020 0258   MCV 87.6 02/10/2020 0258   MCH 25.4 (L) 02/10/2020 0258   MCHC 29.0 (L) 02/10/2020 0258   RDW 15.0 02/10/2020 0258   LYMPHSABS 1.9 02/06/2020 0300   MONOABS 1.0 02/06/2020 0300   EOSABS 0.2 02/06/2020 0300   BASOSABS 0.0 02/06/2020 0300    Critical Care Time: n/a  02/08/2020, MSN, NP-C [facility] 02/10/2020, 8:49 AM   Please see [email or web address] for pager details.

## 2020-02-10 NOTE — Progress Notes (Signed)
Physical Therapy Treatment Patient Details Name: Darlene Castillo MRN: [id number] DOB: 06/22/1961 Today's Date: 02/10/2020    History of Present Illness 59 yo female admitted 01/18/20 with the diagnosis of pneumoperitoneum, peritonitis,  perforated sigmoid colon status post ex lap with colectomy and ostomy remains on the ventilator/trach.  Unable to wean successfully d/t ARF with hypoxemia in setting of major abdominal surgery and acute pulmonary edema.PMH: tobacco use, HTN    PT Comments    The patient ambulated x 20' with RW!. Patient appears pleased. Very participatory with therapies. Patient on 28% trach collar. SPO2 >95%, HR101. RR 22. Continue progressive mobility.  Follow Up Recommendations  SNF;Home health PT;Supervision/Assistance - 24 hour/ CIR     Equipment Recommendations  Rolling walker with 5" wheels    Recommendations for Other Services       Precautions / Restrictions Precautions Precautions: Fall Precaution Comments: trach collar 28%, , multiple lines, abd wound, colostomy, Restrictions Weight Bearing Restrictions: No    Mobility  Bed Mobility               General bed mobility comments: Patient min assist to transfer to side of bed with hand hold assistance and therapist managing lines/leads. Patient had mild posterior lean initially but recovered well to sit edge of bed.  Transfers Overall transfer level: Needs assistance Equipment used: Rolling walker (2 wheeled) Transfers: Sit to/from Stand Sit to Stand: Min assist         General transfer comment: steady assist, cues for  hand oplacement from bed and recliner  Ambulation/Gait Ambulation/Gait assistance: Min assist;+2 physical assistance;+2 safety/equipment Gait Distance (Feet): 5 Feet(then 20') Assistive device: Rolling walker (2 wheeled) Gait Pattern/deviations: Step-to pattern;Decreased step length - right;Decreased step length - left     General Gait Details: slow stepping, steady support,  bearing weight and supports self on RW   Stairs             Wheelchair Mobility    Modified Rankin (Stroke Patients Only)       Balance Overall balance assessment: Needs assistance Sitting-balance support: Bilateral upper extremity supported;Feet supported Sitting balance-Leahy Scale: Fair Sitting balance - Comments: noted trunk sways, reorts feeling too medicated   Standing balance support: Bilateral upper extremity supported;During functional activity Standing balance-Leahy Scale: Poor Standing balance comment: reliant on UE support                            Cognition Arousal/Alertness: Awake/alert Behavior During Therapy: WFL for tasks assessed/performed Overall Cognitive Status: Difficult to assess                                 General Comments: communictaes appropriately with writing and gestures, cooperative today      Exercises      General Comments        Pertinent Vitals/Pain Pain Assessment: Faces Faces Pain Scale: Hurts a little bit Pain Location: abdominal discomfort Pain Descriptors / Indicators: Grimacing Pain Intervention(s): Monitored during session;Premedicated before session    Home Living                      Prior Function            PT Goals (current goals can now be found in the care plan section) Progress towards PT goals: Progressing toward goals    Frequency    Min 3X/week  PT Plan Current plan remains appropriate    Co-evaluation PT/OT/SLP Co-Evaluation/Treatment: Yes Reason for Co-Treatment: Complexity of the patient's impairments (multi-system involvement);For patient/therapist safety PT goals addressed during session: Mobility/safety with mobility OT goals addressed during session: ADL's and self-care      AM-PAC PT "6 Clicks" Mobility   Outcome Measure  Help needed turning from your back to your side while in a flat bed without using bedrails?: A Little Help needed  moving from lying on your back to sitting on the side of a flat bed without using bedrails?: A Little Help needed moving to and from a bed to a chair (including a wheelchair)?: A Lot Help needed standing up from a chair using your arms (e.g., wheelchair or bedside chair)?: A Lot Help needed to walk in hospital room?: A Lot Help needed climbing 3-5 steps with a railing? : Total 6 Click Score: 13    End of Session Equipment Utilized During Treatment: Gait belt;Oxygen Activity Tolerance: Patient tolerated treatment well Patient left: in chair;with call bell/phone within reach;with nursing/sitter in room Nurse Communication: Mobility status PT Visit Diagnosis: Other abnormalities of gait and mobility (R26.89);Muscle weakness (generalized) (M62.81)     Time: 5573-2202 PT Time Calculation (min) (ACUTE ONLY): 26 min  Charges:  $Gait Training: 8-22 mins                     Blanchard Kelch PT Acute Rehabilitation Services Pager [phone] Office [phone]    Rada Hay 02/10/2020, 5:54 PM

## 2020-02-10 NOTE — TOC Progression Note (Signed)
Transition of Care [facility]) - Progression Note    Patient Details  Name: Darlene Castillo MRN: [id number] Date of Birth: [birth year]  [facility]) CM/SW Contact  Shade Flood, LCSW Phone Number: 02/10/2020, 3:21 PM  Clinical Narrative:     TOC following. PT recommending CIR referral. Notified by CIR that pt's insurance is not participating with CIR and pt will need referral outside of [facility]. Met with pt to discuss options. Pt writes on her notepad to communicate. She expresses that she is agreeable to referrals to [facility] and [facility] though her first choice is to go to her mother's home with [facility]. Pt states her mother is a retired Therapist, sports as well.   Started referral process and will follow.  Expected Discharge Plan: [place] Barriers to Discharge: Continued Medical Work up  Expected Discharge Plan and Services Expected Discharge Plan: [place] arrangements for the past 2 months: Single Family Home                                       Social Determinants of Health (SDOH) Interventions    Readmission Risk Interventions No flowsheet data found.

## 2020-02-10 NOTE — Progress Notes (Signed)
Rehab Admissions Coordinator Note:  Per PT recommendation, this patient was screened by Cheri Rous for appropriateness for an Inpatient Acute Rehab Consult.  Noted pt's insurance [employer]) is a [employer] and unlikely to be approved for [facility] CIR. AC will contact TOC team to notify them of possible need for outside market CIR bed search. AC will not pursue CIR program at [facility].   Cheri Rous 02/10/2020, 8:54 AM  I can be reached at [phone].

## 2020-02-10 NOTE — Progress Notes (Addendum)
Patient ID: Darlene Castillo, female   DOB: 07-11-1961, 59 y.o.   MRN: [id number]    23 Days Post-Op  Subjective:  Patient doing well today.  Off vent for over 24 hrs.  Working with PMV more.  Hopefully for MBS today.  Patient very concerned about nervous about having NGT removed for MBS and possibly having to have a Cortrak placed if she doesn't [place].  ROS: unable as she is unable to speak.    Objective: Vital signs in last 24 hours: Temp:  [98.3 F (36.8 C)-99.9 F (37.7 C)] 98.3 F (36.8 C) (05/28 0800) Pulse Rate:  [78-99] 88 (05/28 0400) Resp:  [17-31] 17 (05/28 0400) BP: (127-186)/(71-91) 158/82 (05/28 0540) SpO2:  [95 %-97 %] 97 % (05/28 0816) FiO2 (%):  [28 %] 28 % (05/28 0816) Weight:  [61.5 kg] 61.5 kg (05/28 0500) Last BM Date: 02/09/20  Intake/Output from previous day: 05/27 0701 - 05/28 0700 In: 2783.3 [I.V.:1245.8; BZ/JI:9678; IV Piggyback:110.6] Out: 2650 [Urine:2100; Stool:550] Intake/Output this shift: No intake/output data recorded.  PE: Abd: soft, midline wound is clean and packed.  +BS, NGT in place with TFs at goal rate of 60cc/hr.  Colostomy working well.  Lab Results:  Recent Labs    02/09/20 0640 02/10/20 0258  WBC 12.1* 11.3*  HGB 10.6* 10.8*  HCT 33.6* 37.3  PLT 751* 730*   BMET Recent Labs    02/09/20 0640 02/10/20 0258  NA 137 139  K 4.5 4.1  CL 97* 97*  CO2 28 32  GLUCOSE 110* 106*  BUN 16 20  CREATININE 0.65 0.55  CALCIUM 8.9 8.9   PT/INR No results for input(s): LABPROT, INR in the last 72 hours. CMP     Component Value Date/Time   NA 139 02/10/2020 0258   K 4.1 02/10/2020 0258   CL 97 (L) 02/10/2020 0258   CO2 32 02/10/2020 0258   GLUCOSE 106 (H) 02/10/2020 0258   BUN 20 02/10/2020 0258   CREATININE 0.55 02/10/2020 0258   CALCIUM 8.9 02/10/2020 0258   PROT 7.4 02/09/2020 0640   ALBUMIN 2.3 (L) 02/09/2020 0640   AST 25 02/09/2020 0640   ALT 27 02/09/2020 0640   ALKPHOS 127 (H) 02/09/2020 0640   BILITOT 0.3 02/09/2020  0640   GFRNONAA >60 02/10/2020 0258   GFRAA >60 02/10/2020 0258   Lipase     Component Value Date/Time   LIPASE 19 01/18/2020 0525       Studies/Results: DG CHEST PORT 1 VIEW  Result Date: 02/10/2020 CLINICAL DATA:  Respiratory failure. EXAM: PORTABLE CHEST 1 VIEW COMPARISON:  One-view chest x-ray 02/07/2020 FINDINGS: Tracheostomy tube is in place. Left-sided PICC line is stable. Enteric tube courses off the inferior border of the film. The heart is enlarged. Diffuse interstitial edema is present. Bilateral effusions are again noted, right greater than left. Basilar airspace disease likely reflects atelectasis. Findings are similar the prior study. IMPRESSION: 1. Stable appearance of cardiomegaly and congestive heart failure. 2. Bibasilar airspace disease likely reflects atelectasis. Electronically Signed   By: Marin Roberts M.D.   On: 02/10/2020 07:43    Anti-infectives: Anti-infectives (From admission, onward)   Start     Dose/Rate Route Frequency Ordered Stop   02/04/20 1400  meropenem (MERREM) 1 g in sodium chloride 0.9 % 100 mL IVPB     1 g 200 mL/hr over 30 Minutes Intravenous Every 8 hours 02/04/20 1245 02/10/20 2359   02/02/20 0800  ceFEPIme (MAXIPIME) 2 g in sodium chloride 0.9 %  100 mL IVPB  Status:  Discontinued     2 g 200 mL/hr over 30 Minutes Intravenous Every 8 hours 02/02/20 0727 02/04/20 1245   02/01/20 1200  anidulafungin (ERAXIS) 100 mg in sodium chloride 0.9 % 100 mL IVPB  Status:  Discontinued     100 mg 78 mL/hr over 100 Minutes Intravenous Every 24 hours 01/31/20 1034 02/02/20 0942   02/01/20 0800  piperacillin-tazobactam (ZOSYN) IVPB 3.375 g  Status:  Discontinued     3.375 g 12.5 mL/hr over 240 Minutes Intravenous Every 8 hours 02/01/20 0735 02/02/20 0721   01/31/20 1200  anidulafungin (ERAXIS) 200 mg in sodium chloride 0.9 % 200 mL IVPB     200 mg 78 mL/hr over 200 Minutes Intravenous  Once 01/31/20 1034 01/31/20 1730   01/27/20 1200  cefTRIAXone  (ROCEPHIN) 2 g in sodium chloride 0.9 % 100 mL IVPB  Status:  Discontinued     2 g 200 mL/hr over 30 Minutes Intravenous Every 24 hours 01/27/20 1041 02/01/20 0735   01/26/20 1000  anidulafungin (ERAXIS) 100 mg in sodium chloride 0.9 % 100 mL IVPB  Status:  Discontinued     100 mg 78 mL/hr over 100 Minutes Intravenous Every 24 hours 01/25/20 0956 01/27/20 1041   01/25/20 1100  anidulafungin (ERAXIS) 200 mg in sodium chloride 0.9 % 200 mL IVPB     200 mg 78 mL/hr over 200 Minutes Intravenous  Once 01/25/20 1006 01/25/20 1525   01/18/20 1400  piperacillin-tazobactam (ZOSYN) IVPB 3.375 g  Status:  Discontinued     3.375 g 12.5 mL/hr over 240 Minutes Intravenous Every 8 hours 01/18/20 1311 01/27/20 1041   01/18/20 0715  piperacillin-tazobactam (ZOSYN) IVPB 3.375 g     3.375 g 100 mL/hr over 30 Minutes Intravenous  Once 01/18/20 0707 01/18/20 0837       Assessment/Plan HTN Prediabetic- A1c6.3,SSI Tobacco abuse 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 On Merrem for pseudomonal PNA, completed 8 days of treatment Fever -no fever for 24 hrs Anemia- tx 1 unit of pRBCs 5/18, 5/23. stable R brachial DVT-eliquis  Septic shock Sigmoid colon perforation secondary to chronic constipation and ulceration with feculent peritonitis and gross contamination of abdominal cavity with stool balls -S/pExploratory laparotomy with partial colectomy and end colostomy5/5 Dr. Brantley Stage - POD#23 - MBS hopefully soon per speech's timing.  Patient needs her NGT removed in order to do this, but she is very hesitant to do this as she is concerned about potentially having to have one replaced (Cortrak).  She states she is going to discuss this with her husband.  -as long as she has her tube  continue feeds at goal rate  -continue BID WD dressing changes  -therapies   ID -Merrem - PNA, should complete within the next 1-2 days FEN -NGT/TFs VTE -SCDs,eliquis Foley -d/c 5/13 Dispo - patient seems to be progressing on trach collar, would like to go home with her mother, will continue to see how she progresses   LOS: 23 days   Henreitta Cea , [facility] 02/10/2020, 10:42 AM Please see Amion for pager number during day hours 7:00am-4:30pm or 7:00am -11:30am on weekends  Agree with above. Has tolerated trach collar Abdomen and GI tract stable  Alphonsa Overall, MD, [facility] Office phone:  [phone]

## 2020-02-11 LAB — GLUCOSE, CAPILLARY
Glucose-Capillary: 102 mg/dL — ABNORMAL HIGH (ref 70–99)
Glucose-Capillary: 105 mg/dL — ABNORMAL HIGH (ref 70–99)
Glucose-Capillary: 110 mg/dL — ABNORMAL HIGH (ref 70–99)
Glucose-Capillary: 118 mg/dL — ABNORMAL HIGH (ref 70–99)
Glucose-Capillary: 91 mg/dL (ref 70–99)
Glucose-Capillary: 96 mg/dL (ref 70–99)

## 2020-02-11 LAB — CULTURE, RESPIRATORY W GRAM STAIN

## 2020-02-11 LAB — CBC
HCT: 33 % — ABNORMAL LOW (ref 36.0–46.0)
Hemoglobin: 10.2 g/dL — ABNORMAL LOW (ref 12.0–15.0)
MCH: 25.8 pg — ABNORMAL LOW (ref 26.0–34.0)
MCHC: 30.9 g/dL (ref 30.0–36.0)
MCV: 83.5 fL (ref 80.0–100.0)
Platelets: 646 10*3/uL — ABNORMAL HIGH (ref 150–400)
RBC: 3.95 MIL/uL (ref 3.87–5.11)
RDW: 14.9 % (ref 11.5–15.5)
WBC: 10 10*3/uL (ref 4.0–10.5)
nRBC: 0 % (ref 0.0–0.2)

## 2020-02-11 LAB — BASIC METABOLIC PANEL
Anion gap: 11 (ref 5–15)
BUN: 12 mg/dL (ref 6–20)
CO2: 34 mmol/L — ABNORMAL HIGH (ref 22–32)
Calcium: 9 mg/dL (ref 8.9–10.3)
Chloride: 90 mmol/L — ABNORMAL LOW (ref 98–111)
Creatinine, Ser: 0.42 mg/dL — ABNORMAL LOW (ref 0.44–1.00)
GFR calc Af Amer: >60 mL/min (ref >60)
GFR calc non Af Amer: >60 mL/min (ref >60)
Glucose, Bld: 109 mg/dL — ABNORMAL HIGH (ref 70–99)
Potassium: 3.2 mmol/L — ABNORMAL LOW (ref 3.5–5.1)
Sodium: 135 mmol/L (ref 135–145)

## 2020-02-11 MED ORDER — APIXABAN 5 MG PO TABS
5.0000 mg | ORAL_TABLET | Freq: Two times a day (BID) | ORAL | Status: DC
  Administered 2020-02-11 – 2020-02-16 (×12): 5 mg via ORAL
  Filled 2020-02-11 (×12): qty 1

## 2020-02-11 MED ORDER — CLONIDINE HCL 0.1 MG PO TABS
0.1000 mg | ORAL_TABLET | Freq: Four times a day (QID) | ORAL | Status: DC
  Administered 2020-02-11 – 2020-02-12 (×4): 0.1 mg via ORAL
  Filled 2020-02-11 (×4): qty 1

## 2020-02-11 NOTE — Progress Notes (Signed)
Ventilator removed from pts. room per order by Dr. Craige Cotta placed this date.

## 2020-02-11 NOTE — Progress Notes (Signed)
Patient ID: Darlene Castillo, female   DOB: 11/10/1960, 59 y.o.   MRN: [id number]    24 Days Post-Op  Subjective:  Patient doing well today.  Off vent and on trach collar.  Swallow eval yesterday cleared for thin liquids.      Objective: Vital signs in last 24 hours: Temp:  [98.9 F (37.2 C)-99.6 F (37.6 C)] 98.9 F (37.2 C) (05/29 0800) Pulse Rate:  [69-87] 79 (05/29 0831) Resp:  [13-21] 14 (05/29 0831) BP: (140-179)/(62-93) 171/84 (05/29 0831) SpO2:  [92 %-100 %] 98 % (05/29 0831) FiO2 (%):  [28 %-31 %] 28 % (05/29 0738) Weight:  [60.4 kg] 60.4 kg (05/29 0500) Last BM Date: 02/10/20  Intake/Output from previous day: 05/28 0701 - 05/29 0700 In: 2049.2 [P.O.:480; I.V.:1074.9; NG/GT:200; IV Piggyback:294.3] Out: 2500 [Urine:2250; Stool:250] Intake/Output this shift: No intake/output data recorded.  PE: Abd: soft, midline wound is clean and packed.   Colostomy working well.  Lab Results:  Recent Labs    02/10/20 0258 02/11/20 0500  WBC 11.3* 10.0  HGB 10.8* 10.2*  HCT 37.3 33.0*  PLT 730* 646*   BMET Recent Labs    02/10/20 0258 02/11/20 0500  NA 139 135  K 4.1 3.2*  CL 97* 90*  CO2 32 34*  GLUCOSE 106* 109*  BUN 20 12  CREATININE 0.55 0.42*  CALCIUM 8.9 9.0   PT/INR No results for input(s): LABPROT, INR in the last 72 hours. CMP     Component Value Date/Time   NA 135 02/11/2020 0500   K 3.2 (L) 02/11/2020 0500   CL 90 (L) 02/11/2020 0500   CO2 34 (H) 02/11/2020 0500   GLUCOSE 109 (H) 02/11/2020 0500   BUN 12 02/11/2020 0500   CREATININE 0.42 (L) 02/11/2020 0500   CALCIUM 9.0 02/11/2020 0500   PROT 7.4 02/09/2020 0640   ALBUMIN 2.3 (L) 02/09/2020 0640   AST 25 02/09/2020 0640   ALT 27 02/09/2020 0640   ALKPHOS 127 (H) 02/09/2020 0640   BILITOT 0.3 02/09/2020 0640   GFRNONAA >60 02/11/2020 0500   GFRAA >60 02/11/2020 0500   Lipase     Component Value Date/Time   LIPASE 19 01/18/2020 0525       Studies/Results: DG CHEST PORT 1  VIEW  Result Date: 02/10/2020 CLINICAL DATA:  Respiratory failure. EXAM: PORTABLE CHEST 1 VIEW COMPARISON:  One-view chest x-ray 02/07/2020 FINDINGS: Tracheostomy tube is in place. Left-sided PICC line is stable. Enteric tube courses off the inferior border of the film. The heart is enlarged. Diffuse interstitial edema is present. Bilateral effusions are again noted, right greater than left. Basilar airspace disease likely reflects atelectasis. Findings are similar the prior study. IMPRESSION: 1. Stable appearance of cardiomegaly and congestive heart failure. 2. Bibasilar airspace disease likely reflects atelectasis. Electronically Signed   By: San Morelle M.D.   On: 02/10/2020 07:43   DG Swallowing Func-Speech Pathology  Result Date: 02/10/2020 Objective Swallowing Evaluation: Type of Study: MBS-Modified Barium Swallow Study  Patient Details Name: Darlene Castillo MRN: [id number] Date of Birth: September 15, 1961 Today's Date: 02/10/2020 Time: SLP Start Time (ACUTE ONLY): 1045 -SLP Stop Time (ACUTE ONLY): 1115 SLP Time Calculation (min) (ACUTE ONLY): 30 min Past Medical History: Past Medical History: Diagnosis Date . Blurred vision  . Cervicalgia  . Chronic pain  . Diabetes mellitus without complication (HCC)   diet controlled borderline . Headache  . Hypertension  . Numbness and tingling  Past Surgical History: Past Surgical History: Procedure Laterality Date .  CESAREAN SECTION   . COLON RESECTION N/A 01/18/2020  Procedure: EXPLORATORY LAPAROTOMY SIGMOID RESECTION AND COLOSTOMY;  Surgeon: Harriette Bouillon, MD;  Location: [facility];  Service: General;  Laterality: N/A; . MYOMECTOMY   . wisdoms teeth extracted   HPI: 59yo female admitted 01/18/20 with acute worsening of abdominal pain. 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  Pt with perforation of sigmoid colon requiring surgery.  Required intubation - now with trach  - off vent for a day - and has large bore NG tube in place.  PMSV trials started  yesterday and swallow evaluation desired.  Subjective: pt awake in chair Assessment / Plan / Recommendation CHL IP CLINICAL IMPRESSIONS 02/10/2020 Clinical Impression Surprisingly pt presents with intact oropharyngeal swallow ability.  She does demonstrate delayed oral transiting but this appears to be due to apprehension (and lack of dentition) and not a true dysphagia.  Pharyngeal swallow is timely and strong without aspiration/penetration nor residuals.  SLP did not test a barium tablet due to pt's GI issues but she advises she has a hard time swallowing pills thus recommend crush or suspension by mouth.  Of note, pt did NOT have on PMSV for test due to decreased tolerance of valve but she performed well regardless - therefore no valve needed for po.  Pt coughing after testing but not during - Would advise to consider suctioning before po if gurgly breathing quality noted. Using live video, educated pt to findings/recommendations.  Marland Kitchen SLP Visit Diagnosis Dysphagia, unspecified (R13.10) Attention and concentration deficit following -- Frontal lobe and executive function deficit following -- Impact on safety and function Mild aspiration risk   CHL IP TREATMENT RECOMMENDATION 02/10/2020 Treatment Recommendations Therapy as outlined in treatment plan below   Prognosis 02/10/2020 Prognosis for Safe Diet Advancement Good Barriers to Reach Goals -- Barriers/Prognosis Comment -- CHL IP DIET RECOMMENDATION 02/10/2020 SLP Diet Recommendations Thin liquid Liquid Administration via Cup;Straw Medication Administration Crushed with puree Compensations Slow rate;Small sips/bites Postural Changes Remain semi-upright after after feeds/meals (Comment);Seated upright at 90 degrees   CHL IP OTHER RECOMMENDATIONS 02/10/2020 Recommended Consults -- Oral Care Recommendations Oral care BID Other Recommendations --   CHL IP FOLLOW UP RECOMMENDATIONS 02/10/2020 Follow up Recommendations Inpatient Rehab   CHL IP FREQUENCY AND DURATION 02/10/2020  Speech Therapy Frequency (ACUTE ONLY) min 2x/week Treatment Duration 2 weeks      CHL IP ORAL PHASE 02/10/2020 Oral Phase Impaired Oral - Pudding Teaspoon -- Oral - Pudding Cup -- Oral - Honey Teaspoon -- Oral - Honey Cup -- Oral - Nectar Teaspoon WFL Oral - Nectar Cup WFL;Premature spillage Oral - Nectar Straw -- Oral - Thin Teaspoon WFL;Premature spillage Oral - Thin Cup [facility];Premature spillage Oral - Thin Straw WFL;Premature spillage Oral - Puree Delayed oral transit;Holding of bolus Oral - Mech Soft Delayed oral transit;Impaired mastication;Holding of bolus Oral - Regular -- Oral - Multi-Consistency -- Oral - Pill -- Oral Phase - Comment suspect delayed oral transiting/holding with due to pt's apprehension to swallow - and not a true dysphagia, pt with adequate articulation  CHL IP PHARYNGEAL PHASE 02/10/2020 Pharyngeal Phase Impaired Pharyngeal- Pudding Teaspoon -- Pharyngeal -- Pharyngeal- Pudding Cup -- Pharyngeal -- Pharyngeal- Honey Teaspoon -- Pharyngeal -- Pharyngeal- Honey Cup -- Pharyngeal -- Pharyngeal- Nectar Teaspoon WFL Pharyngeal Material does not enter airway Pharyngeal- Nectar Cup [facility] Pharyngeal Material does not enter airway Pharyngeal- Nectar Straw -- Pharyngeal -- Pharyngeal- Thin Teaspoon WFL Pharyngeal Material does not enter airway Pharyngeal- Thin Cup [facility]  Pharyngeal Material does not enter airway Pharyngeal- Thin Straw WFL Pharyngeal Material does not enter airway Pharyngeal- Puree WFL Pharyngeal Material does not enter airway Pharyngeal- Mechanical Soft WFL Pharyngeal Material does not enter airway Pharyngeal- Regular -- Pharyngeal -- Pharyngeal- Multi-consistency -- Pharyngeal -- Pharyngeal- Pill -- Pharyngeal -- Pharyngeal Comment --  CHL IP CERVICAL ESOPHAGEAL PHASE 02/10/2020 Cervical Esophageal Phase Impaired Pudding Teaspoon -- Pudding Cup -- Honey Teaspoon -- Honey Cup -- Nectar Teaspoon -- Nectar Cup -- Nectar Straw -- Thin Teaspoon -- Thin Cup -- Thin Straw -- Puree -- Mechanical  Soft -- Regular -- Multi-consistency -- Pill -- Cervical Esophageal Comment apperance of prominent cricopharyngeus x1= did not impair barium flow, upon esophageal sweep - appeared clear= no radiologist present to confirm findings [name], MS [facility] SLP Acute Rehab Services Office [phone] [name] 02/10/2020, 11:38 AM               Anti-infectives: Anti-infectives (From admission, onward)   Start     Dose/Rate Route Frequency Ordered Stop   02/04/20 1400  meropenem (MERREM) 1 g in sodium chloride 0.9 % 100 mL IVPB     1 g 200 mL/hr over 30 Minutes Intravenous Every 8 hours 02/04/20 1245 02/10/20 2300   02/02/20 0800  ceFEPIme (MAXIPIME) 2 g in sodium chloride 0.9 % 100 mL IVPB  Status:  Discontinued     2 g 200 mL/hr over 30 Minutes Intravenous Every 8 hours 02/02/20 0727 02/04/20 1245   02/01/20 1200  anidulafungin (ERAXIS) 100 mg in sodium chloride 0.9 % 100 mL IVPB  Status:  Discontinued     100 mg 78 mL/hr over 100 Minutes Intravenous Every 24 hours 01/31/20 1034 02/02/20 0942   02/01/20 0800  piperacillin-tazobactam (ZOSYN) IVPB 3.375 g  Status:  Discontinued     3.375 g 12.5 mL/hr over 240 Minutes Intravenous Every 8 hours 02/01/20 0735 02/02/20 0721   01/31/20 1200  anidulafungin (ERAXIS) 200 mg in sodium chloride 0.9 % 200 mL IVPB     200 mg 78 mL/hr over 200 Minutes Intravenous  Once 01/31/20 1034 01/31/20 1730   01/27/20 1200  cefTRIAXone (ROCEPHIN) 2 g in sodium chloride 0.9 % 100 mL IVPB  Status:  Discontinued     2 g 200 mL/hr over 30 Minutes Intravenous Every 24 hours 01/27/20 1041 02/01/20 0735   01/26/20 1000  anidulafungin (ERAXIS) 100 mg in sodium chloride 0.9 % 100 mL IVPB  Status:  Discontinued     100 mg 78 mL/hr over 100 Minutes Intravenous Every 24 hours 01/25/20 0956 01/27/20 1041   01/25/20 1100  anidulafungin (ERAXIS) 200 mg in sodium chloride 0.9 % 200 mL IVPB     200 mg 78 mL/hr over 200 Minutes Intravenous  Once 01/25/20 1006 01/25/20 1525    01/18/20 1400  piperacillin-tazobactam (ZOSYN) IVPB 3.375 g  Status:  Discontinued     3.375 g 12.5 mL/hr over 240 Minutes Intravenous Every 8 hours 01/18/20 1311 01/27/20 1041   01/18/20 0715  piperacillin-tazobactam (ZOSYN) IVPB 3.375 g     3.375 g 100 mL/hr over 30 Minutes Intravenous  Once 01/18/20 0707 01/18/20 0837       Assessment/Plan HTN Prediabetic- A1c6.3,SSI Tobacco abuse 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 On Merrem for pseudomonal PNA, completed 8 days of treatment Fever -no fever for 24 hrs Anemia- tx 1 unit  of pRBCs 5/18, 5/23. stable R brachial DVT-eliquis  Septic shock Sigmoid colon perforation secondary to chronic constipation and ulceration with feculent peritonitis and gross contamination of abdominal cavity with stool balls -S/pExploratory laparotomy with partial colectomy and end colostomy5/5 Dr. Luisa Hart - POD#24 - clears as tolerated today  -continue BID WD dressing changes  -therapies   ID -Merrem - PNA, should complete within the next 1-2 days FEN -clears/TFs VTE -SCDs,eliquis Foley -d/c 5/13 Dispo - patient seems to be progressing on trach collar, would like to go home with her mother, will continue to see how she progresses   LOS: 24 days   Vanita Panda, MD  [facility]    Office phone:  [phone]

## 2020-02-12 LAB — GLUCOSE, CAPILLARY
Glucose-Capillary: 98 mg/dL (ref 70–99)
Glucose-Capillary: 124 mg/dL — ABNORMAL HIGH (ref 70–99)
Glucose-Capillary: 82 mg/dL (ref 70–99)

## 2020-02-12 MED ORDER — ALBUTEROL SULFATE (2.5 MG/3ML) 0.083% IN NEBU
2.5000 mg | INHALATION_SOLUTION | Freq: Two times a day (BID) | RESPIRATORY_TRACT | Status: DC
  Administered 2020-02-13 – 2020-02-14 (×4): 2.5 mg via RESPIRATORY_TRACT
  Filled 2020-02-12 (×4): qty 3

## 2020-02-12 MED ORDER — CLONIDINE HCL 0.1 MG PO TABS
0.2000 mg | ORAL_TABLET | Freq: Three times a day (TID) | ORAL | Status: DC
  Administered 2020-02-12 – 2020-02-16 (×15): 0.2 mg via ORAL
  Filled 2020-02-12 (×15): qty 2

## 2020-02-12 NOTE — Progress Notes (Signed)
Pt arrived on unit from ICU. VS are stable and trach checklist and trach care items are all at the bedside. Pt is resting comfortably.

## 2020-02-12 NOTE — Progress Notes (Signed)
Placed PT on new aerosol trach collar. 

## 2020-02-12 NOTE — Progress Notes (Signed)
Patient ID: Darlene Castillo, female   DOB: Oct 26, 1960, 59 y.o.   MRN: [id number]    25 Days Post-Op  Subjective:  Patient doing well today.  Off vent and on trach collar.  Swallow eval yesterday cleared for diet advancement.  Tolerating clears      Objective: Vital signs in last 24 hours: Temp:  [97.8 F (36.6 C)-99.1 F (37.3 C)] 97.8 F (36.6 C) (05/30 0323) Pulse Rate:  [61-80] 80 (05/30 0733) Resp:  [11-23] 19 (05/30 0733) BP: (141-189)/(67-153) 145/70 (05/30 0000) SpO2:  [94 %-100 %] 94 % (05/30 0733) FiO2 (%):  [28 %] 28 % (05/30 0733) Last BM Date: 02/10/20  Intake/Output from previous day: 05/29 0701 - 05/30 0700 In: 531 [I.V.:531] Out: 750 [Urine:750] Intake/Output this shift: No intake/output data recorded.  PE: Abd: soft, midline wound is clean and packed.   Colostomy working well.  Lab Results:  Recent Labs    02/10/20 0258 02/11/20 0500  WBC 11.3* 10.0  HGB 10.8* 10.2*  HCT 37.3 33.0*  PLT 730* 646*   BMET Recent Labs    02/10/20 0258 02/11/20 0500  NA 139 135  K 4.1 3.2*  CL 97* 90*  CO2 32 34*  GLUCOSE 106* 109*  BUN 20 12  CREATININE 0.55 0.42*  CALCIUM 8.9 9.0   PT/INR No results for input(s): LABPROT, INR in the last 72 hours. CMP     Component Value Date/Time   NA 135 02/11/2020 0500   K 3.2 (L) 02/11/2020 0500   CL 90 (L) 02/11/2020 0500   CO2 34 (H) 02/11/2020 0500   GLUCOSE 109 (H) 02/11/2020 0500   BUN 12 02/11/2020 0500   CREATININE 0.42 (L) 02/11/2020 0500   CALCIUM 9.0 02/11/2020 0500   PROT 7.4 02/09/2020 0640   ALBUMIN 2.3 (L) 02/09/2020 0640   AST 25 02/09/2020 0640   ALT 27 02/09/2020 0640   ALKPHOS 127 (H) 02/09/2020 0640   BILITOT 0.3 02/09/2020 0640   GFRNONAA >60 02/11/2020 0500   GFRAA >60 02/11/2020 0500   Lipase     Component Value Date/Time   LIPASE 19 01/18/2020 0525       Studies/Results: DG Swallowing Func-Speech Pathology  Result Date: 02/10/2020 Objective Swallowing Evaluation: Type of  Study: MBS-Modified Barium Swallow Study  Patient Details Name: Darlene Castillo MRN: [id number] Date of Birth: [birth year] Today's Date: 02/10/2020 Time: SLP Start Time (ACUTE ONLY): 1045 -SLP Stop Time (ACUTE ONLY): 1115 SLP Time Calculation (min) (ACUTE ONLY): 30 min Past Medical History: Past Medical History: Diagnosis Date . Blurred vision  . Cervicalgia  . Chronic pain  . Diabetes mellitus without complication (HCC)   diet controlled borderline . Headache  . Hypertension  . Numbness and tingling  Past Surgical History: Past Surgical History: Procedure Laterality Date . CESAREAN SECTION   . COLON RESECTION N/A 01/18/2020  Procedure: EXPLORATORY LAPAROTOMY SIGMOID RESECTION AND COLOSTOMY;  Surgeon: Erroll Luna, MD;  Location: [facility];  Service: General;  Laterality: N/A; . MYOMECTOMY   . wisdoms teeth extracted   HPI: 59yo female admitted 01/18/20 with acute worsening of abdominal pain. 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  Pt with perforation of sigmoid colon requiring surgery.  Required intubation - now with trach  - off vent for a day - and has large bore NG tube in place.  PMSV trials started yesterday and swallow evaluation desired.  Subjective: pt awake in chair Assessment / Plan / Recommendation CHL IP CLINICAL IMPRESSIONS  02/10/2020 Clinical Impression Surprisingly pt presents with intact oropharyngeal swallow ability.  She does demonstrate delayed oral transiting but this appears to be due to apprehension (and lack of dentition) and not a true dysphagia.  Pharyngeal swallow is timely and strong without aspiration/penetration nor residuals.  SLP did not test a barium tablet due to pt's GI issues but she advises she has a hard time swallowing pills thus recommend crush or suspension by mouth.  Of note, pt did NOT have on PMSV for test due to decreased tolerance of valve but she performed well regardless - therefore no valve needed for po.  Pt coughing after testing but not during - Would  advise to consider suctioning before po if gurgly breathing quality noted. Using live video, educated pt to findings/recommendations.  Marland Kitchen SLP Visit Diagnosis Dysphagia, unspecified (R13.10) Attention and concentration deficit following -- Frontal lobe and executive function deficit following -- Impact on safety and function Mild aspiration risk   CHL IP TREATMENT RECOMMENDATION 02/10/2020 Treatment Recommendations Therapy as outlined in treatment plan below   Prognosis 02/10/2020 Prognosis for Safe Diet Advancement Good Barriers to Reach Goals -- Barriers/Prognosis Comment -- CHL IP DIET RECOMMENDATION 02/10/2020 SLP Diet Recommendations Thin liquid Liquid Administration via Cup;Straw Medication Administration Crushed with puree Compensations Slow rate;Small sips/bites Postural Changes Remain semi-upright after after feeds/meals (Comment);Seated upright at 90 degrees   CHL IP OTHER RECOMMENDATIONS 02/10/2020 Recommended Consults -- Oral Care Recommendations Oral care BID Other Recommendations --   CHL IP FOLLOW UP RECOMMENDATIONS 02/10/2020 Follow up Recommendations Inpatient Rehab   CHL IP FREQUENCY AND DURATION 02/10/2020 Speech Therapy Frequency (ACUTE ONLY) min 2x/week Treatment Duration 2 weeks      CHL IP ORAL PHASE 02/10/2020 Oral Phase Impaired Oral - Pudding Teaspoon -- Oral - Pudding Cup -- Oral - Honey Teaspoon -- Oral - Honey Cup -- Oral - Nectar Teaspoon WFL Oral - Nectar Cup WFL;Premature spillage Oral - Nectar Straw -- Oral - Thin Teaspoon WFL;Premature spillage Oral - Thin Cup [facility];Premature spillage Oral - Thin Straw WFL;Premature spillage Oral - Puree Delayed oral transit;Holding of bolus Oral - Mech Soft Delayed oral transit;Impaired mastication;Holding of bolus Oral - Regular -- Oral - Multi-Consistency -- Oral - Pill -- Oral Phase - Comment suspect delayed oral transiting/holding with due to pt's apprehension to swallow - and not a true dysphagia, pt with adequate articulation  CHL IP PHARYNGEAL PHASE  02/10/2020 Pharyngeal Phase Impaired Pharyngeal- Pudding Teaspoon -- Pharyngeal -- Pharyngeal- Pudding Cup -- Pharyngeal -- Pharyngeal- Honey Teaspoon -- Pharyngeal -- Pharyngeal- Honey Cup -- Pharyngeal -- Pharyngeal- Nectar Teaspoon WFL Pharyngeal Material does not enter airway Pharyngeal- Nectar Cup [facility] Pharyngeal Material does not enter airway Pharyngeal- Nectar Straw -- Pharyngeal -- Pharyngeal- Thin Teaspoon WFL Pharyngeal Material does not enter airway Pharyngeal- Thin Cup [facility] Pharyngeal Material does not enter airway Pharyngeal- Thin Straw WFL Pharyngeal Material does not enter airway Pharyngeal- Puree WFL Pharyngeal Material does not enter airway Pharyngeal- Mechanical Soft WFL Pharyngeal Material does not enter airway Pharyngeal- Regular -- Pharyngeal -- Pharyngeal- Multi-consistency -- Pharyngeal -- Pharyngeal- Pill -- Pharyngeal -- Pharyngeal Comment --  CHL IP CERVICAL ESOPHAGEAL PHASE 02/10/2020 Cervical Esophageal Phase Impaired Pudding Teaspoon -- Pudding Cup -- Honey Teaspoon -- Honey Cup -- Nectar Teaspoon -- Nectar Cup -- Nectar Straw -- Thin Teaspoon -- Thin Cup -- Thin Straw -- Puree -- Mechanical Soft -- Regular -- Multi-consistency -- Pill -- Cervical Esophageal Comment apperance of prominent cricopharyngeus x1= did not impair barium flow, upon esophageal  sweep - appeared clear= no radiologist present to confirm findings [name], MS [facility] SLP Acute Rehab Services Office [phone] [name] 02/10/2020, 11:38 AM               Anti-infectives: Anti-infectives (From admission, onward)   Start     Dose/Rate Route Frequency Ordered Stop   02/04/20 1400  meropenem (MERREM) 1 g in sodium chloride 0.9 % 100 mL IVPB     1 g 200 mL/hr over 30 Minutes Intravenous Every 8 hours 02/04/20 1245 02/10/20 2300   02/02/20 0800  ceFEPIme (MAXIPIME) 2 g in sodium chloride 0.9 % 100 mL IVPB  Status:  Discontinued     2 g 200 mL/hr over 30 Minutes Intravenous Every 8 hours 02/02/20 0727 02/04/20  1245   02/01/20 1200  anidulafungin (ERAXIS) 100 mg in sodium chloride 0.9 % 100 mL IVPB  Status:  Discontinued     100 mg 78 mL/hr over 100 Minutes Intravenous Every 24 hours 01/31/20 1034 02/02/20 0942   02/01/20 0800  piperacillin-tazobactam (ZOSYN) IVPB 3.375 g  Status:  Discontinued     3.375 g 12.5 mL/hr over 240 Minutes Intravenous Every 8 hours 02/01/20 0735 02/02/20 0721   01/31/20 1200  anidulafungin (ERAXIS) 200 mg in sodium chloride 0.9 % 200 mL IVPB     200 mg 78 mL/hr over 200 Minutes Intravenous  Once 01/31/20 1034 01/31/20 1730   01/27/20 1200  cefTRIAXone (ROCEPHIN) 2 g in sodium chloride 0.9 % 100 mL IVPB  Status:  Discontinued     2 g 200 mL/hr over 30 Minutes Intravenous Every 24 hours 01/27/20 1041 02/01/20 0735   01/26/20 1000  anidulafungin (ERAXIS) 100 mg in sodium chloride 0.9 % 100 mL IVPB  Status:  Discontinued     100 mg 78 mL/hr over 100 Minutes Intravenous Every 24 hours 01/25/20 0956 01/27/20 1041   01/25/20 1100  anidulafungin (ERAXIS) 200 mg in sodium chloride 0.9 % 200 mL IVPB     200 mg 78 mL/hr over 200 Minutes Intravenous  Once 01/25/20 1006 01/25/20 1525   01/18/20 1400  piperacillin-tazobactam (ZOSYN) IVPB 3.375 g  Status:  Discontinued     3.375 g 12.5 mL/hr over 240 Minutes Intravenous Every 8 hours 01/18/20 1311 01/27/20 1041   01/18/20 0715  piperacillin-tazobactam (ZOSYN) IVPB 3.375 g     3.375 g 100 mL/hr over 30 Minutes Intravenous  Once 01/18/20 0707 01/18/20 0837       Assessment/Plan HTN Prediabetic- A1c6.3,SSI Tobacco abuse 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 On Merrem for pseudomonal PNA, completed 8 days of treatment Fever -no fever for 24 hrs Anemia- tx 1 unit of pRBCs 5/18, 5/23. stable R brachial DVT-eliquis  Septic  shock Sigmoid colon perforation secondary to chronic constipation and ulceration with feculent peritonitis and gross contamination of abdominal cavity with stool balls -S/pExploratory laparotomy with partial colectomy and end colostomy5/5 Dr. Luisa Hart - POD#25 - clears as tolerated today  -continue BID WD dressing changes  -therapies   ID -Merrem - PNA, should complete within the next 1-2 days FEN -soft diet VTE -SCDs,eliquis Foley -d/c 5/13 Dispo - patient seems to be progressing on trach collar, would like to go home with her mother, will continue to see how she progresses.  Advance diet today   LOS: 25 days   Vanita Panda, MD  Colorectal  and General Surgery [facility]    Office phone:  [phone]

## 2020-02-12 NOTE — Progress Notes (Signed)
NAME:  Darlene Castillo, MRN:  [id number], DOB:  25-May-1961, LOS: 2 ADMISSION DATE:  01/18/2020, CONSULTATION DATE:  01/18/20 REFERRING MD:  Erroll Luna CHIEF COMPLAINT:  VDRF, septic shock  Brief History   29  yobf active smoker admitted 5/5 with abdominal pain, constipation and one episode of vomiting. Found to have a perforated sigmoid colon with peritonitis.  To OR for exploratory laparotomy with partial colectomy and end colostomy (negative pathology), returned to ICU post-operatively on mechanical ventilation. Prolonged respiratory failure s/p tracheostomy 5/19.  Difficult to control agitation / pain while in ICU.  Weaned from vent 5/26.    Past Medical History  Chronic opiate use Tobacco abuse DM HTN  Significant Hospital Events   5/05 Admit, ex lap with partial colectomy and end colostomy for perf:  neg path 5/11 Extubated 5/12 Failed extubation.  Initially DNI but reversed.   5/13  Back on vent. Fever curve better. Concern for A Fib but EKG sinus tach with PAC. Down to 40% fio2 . TF on hold but getting meds via NG. On TPN. On dilaudid gtt, scheduled methadone, versed gtt, clonidine, seroquel and nicotine patch. Started on IV heparin for RUE DVT  5/14 dc versed gtt. Working on weaning sedation. Changing abx to rocephin 5/15 Methadone held at family request  5/16 Restarted methadone due to significant agitation 5/18 Agitation overnight, sedation gtt's increased 5/19 Trach.  Dilaudid stopped. Intra stomal enema  5/20 Sedation reduction  5/21 On PSV wean 10/5.  TF on hold with emesis overnight.  5/22 PSV wean, diuresis with 1.5L UOP.  Nausea.  5/23 PRVC 1 unit for Hgb 6.9 5/24 Up in chair, weaned on PSV  5/25 PSV wean ~ 4 hours, then ATC ~ 8 hours 5/26 PSV to open ended ATC  5/28 Remains on ATC 5/30 transferred to floor/ PMV training ongoing and advanced to soft mech diet   Consults:  CCS PCCM  Procedures:  R IJ CVL 5/5 >> 5/6 ALine 5/5 >> 5/7  ETT 5/5 >> 5/11, replaced  5/12 >> 5/19  Trach 5/19 >> LUE PICC 5/6 >>  Significant Diagnostic Tests:   CT abdomen pelvis with contrast 01/17/2020 >> scattered groundglass opacities in the lower lungs bilaterally, no focal opacities.  Abdominal free air.  Multiple dilated loops of bowel with stool.  Large R renal cyst.  Path 5/5 >> Perforation with acute inflammation and acute serositis. No evidence of malignancy.    [place] UE 5/13 >> RUE DVT  CT ABD / Pelvis w Contrast 5/17 >> increasingly organized apearance of LLQ & pelvic fluid with slight decrease in the deep pelvic fluid.  Slight increase in LLQ fluid. Interloop fluid in the left hemiabdomen is slightly increased overall.  Diffuse interloop fluid and mesenteric stranding is slightly diminished. Persistent small areas of loculated perihepatic fluid unchanged, largest area in the axial plane measureing 2.5 x 1.7 cm. Decreased formed stool in the colon but with slight increase in colonic distention particularly of the transverse colon.  May reflect colonic ileus.  Persistent bibasilar consolidation and pleural effusions without significant change.   CT Sinus 5/20 >> sinus mucosal edema, bilateral mastoid effusions, no air fluid levels, no abscess in neck  MBS 5/28 >>  SLP Diet Recommendations: Thin liquid(clears - will follow up for advancement re: solids   Micro Data:  Covid 5/5 >> negative Flu PCR 5/5 >> negative MRSA PCR 5/12 >> neg Tracheal aspirate 5/12 >> klebsiella > R ampicillin, Unasyn, I: zosyn Urine analysis 5/12 >>  negative  Blood culture 5/12 >> negative  UC 5/19 >> negative  BCx2 5/18 >> negative  Tracheal Aspirate 5/20 >> pseudomonas aeruginosa >> I-ceftazidime, cefepime.  S-imipenem, cipro Tracheal Aspirate 5/26 >> gm stain pmns rare years > culture = few pseudomonas sensitive to cipro BC x 1 5/26  >>  Urine 5/27 >> no significant growth  Antimicrobials:  Zosyn 5/5 >> 5/14 Eraxis 5/12 >> 5/14 Rocephin 5/14 >> Eraxis 5/18 >> 5/20 Zosyn 5/19 >>  5/19 Cefepime 5/20 >> 5/22 Meropenem 5/22 >> 5/28   Scheduled Meds: . albuterol  2.5 mg Nebulization Q6H  . apixaban  5 mg Oral BID  . chlorhexidine gluconate (MEDLINE KIT)  15 mL Mouth Rinse BID  . Chlorhexidine Gluconate Cloth  6 each Topical Daily  . cloNIDine  0.1 mg Oral Q6H  . famotidine  20 mg Oral QHS  . feeding supplement  1 Container Oral TID BM  . insulin aspart  0-6 Units Subcutaneous Q4H  . insulin aspart  2 Units Subcutaneous Q4H  . mouth rinse  15 mL Mouth Rinse 10 times per day  . melatonin  3 mg Oral QHS  . methadone  10 mg Oral Q6H  . metoprolol tartrate  25 mg Oral BID  . naloxegol oxalate  12.5 mg Oral Daily  . nicotine  7 mg Transdermal Daily  . polyethylene glycol  17 g Per Tube Daily  . QUEtiapine  50 mg Oral QHS  . sodium chloride  1 spray Each Nare BID  . sodium chloride flush  10-40 mL Intracatheter Q12H   Continuous Infusions: . sodium chloride Stopped (01/25/20 2356)  . dextrose 5 % and 0.9% NaCl 50 mL/hr at 02/11/20 0851   PRN Meds:.sodium chloride, alum & mag hydroxide-simeth, HYDROcodone-acetaminophen, lip balm, metoprolol tartrate, olopatadine, ondansetron (ZOFRAN) IV, prochlorperazine, sodium chloride flush   Interim history/subjective:     Objective   Blood pressure (!) 145/70, pulse 80, temperature 97.8 F (36.6 C), temperature source Oral, resp. rate 19, height 5' 1"  (1.549 m), weight 60.4 kg, last menstrual period 05/23/2013, SpO2 94 %.    FiO2 (%):  [28 %] 28 %   Intake/Output Summary (Last 24 hours) at 02/12/2020 0814 Last data filed at 02/11/2020 1700 Gross per 24 hour  Intake 530.95 ml  Output 750 ml  Net -219.05 ml   Filed Weights   02/09/20 0932 02/10/20 0500 02/11/20 0500  Weight: 63 kg 61.5 kg 60.4 kg   Exam: Tmax 99.1 trend is down  Pt alert, approp nad @ 30 degrees/ thumbs up, all smiles  No jvd Oropharynx clear,  mucosa nl Neck supple/ trach site looks good  Lungs with a few scattered exp > insp rhonchi  bilaterally RRR no s3 or or sign murmur Abd soft/ limited excursion  Extr warm with no edema or clubbing noted Neuro  Sensorium appears intact,  no apparent motor deficits     Resolved problems  Circulatory/septic shock Mild Elevation of LFT's  Acute Metabolic Encephalopathy, started 5/12- resolved 5//24  Assessment & Plan:  LOS 25 days   Acute hypoxic respiratory failure requiring mechanical ventilation, complicated by Klebsiella PNA.  -continue trach collar ? Consider remove p Mem day weekend  -OOB / mobilize to extent possible     Chronic Pain, on oral methadone PTA Difficulties with intermittent agitation, family requested for patient to come off home medications, agitated delirium worsened. Improved 5/17 back on home methadone.   -completed klonopin taper -continue seroquel, melatonin QHS -methadone per [facility] -  increased clonidine 5/30   Peritonitis 2/2 Sigmoid Colon Perforation from Chronic Constipation s/p ex lap with sigmoid resection and colostomy. Completed 10 days abx for initial peritonitis. Required enema for constipation 5/19. -post-op care / pain control per CCS  Right Brachial DVT -continue eliquis -consider 3 months duration for treatment   Emesis -PRN zofran   Moderate Malnutrition    Intermittent Fluid and Electrolyte imbalance - hypokalemia  -monitor, replace as indicated   Hyperglycemia -SSI  Anemia    Lab Results  Component Value Date   HGB 10.2 (L) 02/11/2020   HGB 10.8 (L) 02/10/2020   HGB 10.6 (L) 02/09/2020   -follow CBC -transfuse for Hgb<7%  Intermittent Fevers -completed abx as above -minimize all lines, no foley  -follow cultures  Best practice:  Diet: mech soft  Pain/Anxiety/Delirium protocol (if indicated): see above VAP protocol (if indicated): In place DVT prophylaxis: Eliquis  GI prophylaxis: pepcid Glucose control: SSI Mobility: up as tol  Code Status: full code Family Communication: pending 5/30    Disposition:floor as of 5/30   BMP Latest Ref Rng & Units 02/11/2020 02/10/2020 02/09/2020  Glucose 70 - 99 mg/dL 109(H) 106(H) 110(H)  BUN 6 - 20 mg/dL 12 20 16   Creatinine 0.44 - 1.00 mg/dL 0.42(L) 0.55 0.65  Sodium 135 - 145 mmol/L 135 139 137  Potassium 3.5 - 5.1 mmol/L 3.2(L) 4.1 4.5  Chloride 98 - 111 mmol/L 90(L) 97(L) 97(L)  CO2 22 - 32 mmol/L 34(H) 32 28  Calcium 8.9 - 10.3 mg/dL 9.0 8.9 8.9    CBC    Component Value Date/Time   WBC 10.0 02/11/2020 0500   RBC 3.95 02/11/2020 0500   HGB 10.2 (L) 02/11/2020 0500   HCT 33.0 (L) 02/11/2020 0500   PLT 646 (H) 02/11/2020 0500   MCV 83.5 02/11/2020 0500   MCH 25.8 (L) 02/11/2020 0500   MCHC 30.9 02/11/2020 0500   RDW 14.9 02/11/2020 0500   LYMPHSABS 1.9 02/06/2020 0300   MONOABS 1.0 02/06/2020 0300   EOSABS 0.2 02/06/2020 0300   BASOSABS 0.0 02/06/2020 0300       Christinia Gully, MD Pulmonary and [facility] [phone] After 6:00 PM or weekends, use Beeper [phone]  After 7:00 pm call Elink  [phone]

## 2020-02-13 LAB — CULTURE, BLOOD (SINGLE)
Culture: NO GROWTH
Special Requests: ADEQUATE

## 2020-02-13 NOTE — Progress Notes (Signed)
NAME:  Darlene Castillo, MRN:  [id number], DOB:  1961-07-28, LOS: 81 ADMISSION DATE:  01/18/2020, CONSULTATION DATE:  01/18/20 REFERRING MD:  Erroll Luna CHIEF COMPLAINT:  VDRF, septic shock  Brief History   38  yobf active smoker admitted 5/5 with abdominal pain, constipation and one episode of vomiting. Found to have a perforated sigmoid colon with peritonitis.  To OR for exploratory laparotomy with partial colectomy and end colostomy (negative pathology), returned to ICU post-operatively on mechanical ventilation. Prolonged respiratory failure s/p tracheostomy 5/19.  Difficult to control agitation / pain while in ICU.  Weaned from vent 5/26.    Past Medical History  Chronic opiate use Tobacco abuse DM HTN  Significant Hospital Events   5/05 Admit, ex lap with partial colectomy and end colostomy for perf:  neg path 5/11 Extubated 5/12 Failed extubation.  Initially DNI but reversed.   5/13  Back on vent. Fever curve better. Concern for A Fib but EKG sinus tach with PAC. Down to 40% fio2 . TF on hold but getting meds via NG. On TPN. On dilaudid gtt, scheduled methadone, versed gtt, clonidine, seroquel and nicotine patch. Started on IV heparin for RUE DVT  5/14 dc versed gtt. Working on weaning sedation. Changing abx to rocephin 5/15 Methadone held at family request  5/16 Restarted methadone due to significant agitation 5/18 Agitation overnight, sedation gtt's increased 5/19 Trach.  Dilaudid stopped. Intra stomal enema  5/20 Sedation reduction  5/21 On PSV wean 10/5.  TF on hold with emesis overnight.  5/22 PSV wean, diuresis with 1.5L UOP.  Nausea.  5/23 PRVC 1 unit for Hgb 6.9 5/24 Up in chair, weaned on PSV  5/25 PSV wean ~ 4 hours, then ATC ~ 8 hours 5/26 PSV to open ended ATC  5/28 Remains on ATC 5/30 transferred to floor/ PMV training ongoing and advanced to soft mech diet   Consults:  CCS PCCM  Procedures:  R IJ CVL 5/5 >> 5/6 ALine 5/5 >> 5/7  ETT 5/5 >> 5/11, replaced  5/12 >> 5/19  Trach 5/19 >> LUE PICC 5/6 >>  Significant Diagnostic Tests:   CT abdomen pelvis with contrast 01/17/2020 >> scattered groundglass opacities in the lower lungs bilaterally, no focal opacities.  Abdominal free air.  Multiple dilated loops of bowel with stool.  Large R renal cyst.  Path 5/5 >> Perforation with acute inflammation and acute serositis. No evidence of malignancy.    [place] UE 5/13 >> RUE DVT  CT ABD / Pelvis w Contrast 5/17 >> increasingly organized apearance of LLQ & pelvic fluid with slight decrease in the deep pelvic fluid.  Slight increase in LLQ fluid. Interloop fluid in the left hemiabdomen is slightly increased overall.  Diffuse interloop fluid and mesenteric stranding is slightly diminished. Persistent small areas of loculated perihepatic fluid unchanged, largest area in the axial plane measureing 2.5 x 1.7 cm. Decreased formed stool in the colon but with slight increase in colonic distention particularly of the transverse colon.  May reflect colonic ileus.  Persistent bibasilar consolidation and pleural effusions without significant change.   CT Sinus 5/20 >> sinus mucosal edema, bilateral mastoid effusions, no air fluid levels, no abscess in neck  MBS 5/28 >>  SLP Diet Recommendations: Thin liquid(clears - will follow up for advancement re: solids   Micro Data:  Covid 5/5 >> negative Flu PCR 5/5 >> negative MRSA PCR 5/12 >> neg Tracheal aspirate 5/12 >> klebsiella > R ampicillin, Unasyn, I: zosyn Urine analysis 5/12 >>  negative  Blood culture 5/12 >> negative  UC 5/19 >> negative  BCx2 5/18 >> negative  Tracheal Aspirate 5/20 >> pseudomonas aeruginosa >> I-ceftazidime, cefepime.  S-imipenem, cipro Tracheal Aspirate 5/26 >> gm stain pmns rare years > culture = few pseudomonas sensitive to cipro BC x 1 5/26  >> neg Urine 5/27 >> no significant growth  Antimicrobials:  Zosyn 5/5 >> 5/14 Eraxis 5/12 >> 5/14 Rocephin 5/14 >> Eraxis 5/18 >> 5/20 Zosyn 5/19  >> 5/19 Cefepime 5/20 >> 5/22 Meropenem 5/22 >> 5/28   Scheduled Meds: . albuterol  2.5 mg Nebulization BID  . apixaban  5 mg Oral BID  . chlorhexidine gluconate (MEDLINE KIT)  15 mL Mouth Rinse BID  . Chlorhexidine Gluconate Cloth  6 each Topical Daily  . cloNIDine  0.2 mg Oral TID  . famotidine  20 mg Oral QHS  . feeding supplement  1 Container Oral TID BM  . mouth rinse  15 mL Mouth Rinse 10 times per day  . melatonin  3 mg Oral QHS  . methadone  10 mg Oral Q6H  . metoprolol tartrate  25 mg Oral BID  . naloxegol oxalate  12.5 mg Oral Daily  . nicotine  7 mg Transdermal Daily  . polyethylene glycol  17 g Per Tube Daily  . QUEtiapine  50 mg Oral QHS  . sodium chloride  1 spray Each Nare BID  . sodium chloride flush  10-40 mL Intracatheter Q12H   Continuous Infusions: . sodium chloride Stopped (01/25/20 2356)  . dextrose 5 % and 0.9% NaCl 10 mL/hr at 02/12/20 1400   PRN Meds:.sodium chloride, alum & mag hydroxide-simeth, HYDROcodone-acetaminophen, lip balm, metoprolol tartrate, olopatadine, ondansetron (ZOFRAN) IV, prochlorperazine, sodium chloride flush   Interim history/subjective:   doing great on floor / 24 h t collar/ minima secretion issues, did not like PMV/ wants trach out  Objective   Blood pressure 131/74, pulse 77, temperature 99.4 F (37.4 C), temperature source Oral, resp. rate 16, height 5' 1" (1.549 m), weight 60.4 kg, last menstrual period 05/23/2013, SpO2 97 %.    FiO2 (%):  [28 %] 28 %   Intake/Output Summary (Last 24 hours) at 02/13/2020 1039 Last data filed at 02/13/2020 0600 Gross per 24 hour  Intake 426.56 ml  Output 400 ml  Net 26.56 ml   Filed Weights   02/09/20 0932 02/10/20 0500 02/11/20 0500  Weight: 63 kg 61.5 kg 60.4 kg   Exam: Tmax 99.5 Pt alert, approp nad  And smiles on T collar @  FI02 28% And 30 degrees up [place] good shape Lungs with distant bs no wheeze  RRR no s3  abd soft Ext warm Neuro intact     Resolved problems    Circulatory/septic shock Mild Elevation of LFT's  Acute Metabolic Encephalopathy, started 5/12- resolved 5//24  Assessment & Plan:  LOS 26 days   Acute hypoxic respiratory failure requiring mechanical ventilation, complicated by Klebsiella PNA.  -continue trach collar ? Consider remove p Mem day weekend  -OOB / mobilize to extent possible  >>>>  Check cxr am 6/1 ? Consider plug then remove trach if tol     Chronic Pain, on oral methadone PTA Difficulties with intermittent agitation, family requested for patient to come off home medications, agitated delirium worsened. Improved 5/17 back on home methadone.   -completed klonopin taper -continue seroquel, melatonin QHS -methadone per [facility] - increased clonidine 5/30   Peritonitis 2/2 Sigmoid Colon Perforation from Chronic Constipation s/p ex lap  with sigmoid resection and colostomy. Completed 10 days abx for initial peritonitis. Required enema for constipation 5/19. -post-op care / pain control per [facility]  Right Brachial DVT -continue eliquis -consider 3 months duration for treatment   Emesis -PRN zofran   Moderate Malnutrition    Intermittent Fluid and Electrolyte imbalance - hypokalemia  -monitor, replace as indicated   Hyperglycemia -SSI  Anemia    Lab Results  Component Value Date   HGB 10.2 (L) 02/11/2020   HGB 10.8 (L) 02/10/2020   HGB 10.6 (L) 02/09/2020   -follow CBC -transfuse for Hgb<7%  Intermittent Fevers -completed abx as above -minimize all lines, no foley     Best practice:  Diet: mech soft  DVT prophylaxis: Eliquis  GI prophylaxis: pepcid Glucose control: SSI Mobility: up as tol  Code Status: full code Disposition:floor as of 5/30   BMP Latest Ref Rng & Units 02/11/2020 02/10/2020 02/09/2020  Glucose 70 - 99 mg/dL 109(H) 106(H) 110(H)  BUN 6 - 20 mg/dL _0 Creatinine 0.44 - 1.00 mg/dL 0.42(L) 0.55 0.65  Sodium 135 - 145 mmol/L 135 139 137  Potassium 3.5 - 5.1 mmol/L 3.2(L) 4.1 4.5   Chloride 98 - 111 mmol/L 90(L) 97(L) 97(L)  CO2 22 - 32 mmol/L 34(H) 32 28  Calcium 8.9 - 10.3 mg/dL 9.0 8.9 8.9    CBC    Component Value Date/Time   WBC 10.0 02/11/2020 0500   RBC 3.95 02/11/2020 0500   HGB 10.2 (L) 02/11/2020 0500   HCT 33.0 (L) 02/11/2020 0500   PLT 646 (H) 02/11/2020 0500   MCV 83.5 02/11/2020 0500   MCH 25.8 (L) 02/11/2020 0500   MCHC 30.9 02/11/2020 0500   RDW 14.9 02/11/2020 0500   LYMPHSABS 1.9 02/06/2020 0300   MONOABS 1.0 02/06/2020 0300   EOSABS 0.2 02/06/2020 0300   BASOSABS 0.0 02/06/2020 0300        Christinia Gully, MD Pulmonary and [place] Cell [phone] After 6:00 PM or weekends, use Beeper [phone]  After 7:00 pm call Elink  [phone]

## 2020-02-13 NOTE — Progress Notes (Signed)
  Speech Language Pathology Treatment: Dysphagia  Patient Details Name: Darlene Castillo MRN: [id number] DOB: 1961-01-05 Today's Date: 02/13/2020 Time: 4098-1191 SLP Time Calculation (min) (ACUTE ONLY): 18 min  Assessment / Plan / Recommendation Clinical Impression  Pt seen to assure po tolerance after her MBS on Friday 02/10/2020.  Pt transferred to floor from ICU since SLP last visit.  Cranberry juice present at bedside - and SLP observed pt with intake.  She takes small bites/sips independently; and denies coughing associated with po.  Pt does admit to taste buds not returning yet = ageusia and therefore intake is suboptimal. Pt now has her dentures - which were not present during MBS.  Note [employer] in the room with her = given her articulation abilities and her caution- regular foods should be tolerated well.    No clinical indications of airway compromise with po observed; Pt reports she continues to prefer to take her medication crushed for "a few more days" = has h/o pill dysphagia.    SLP informed her to need to sit fully upright and take small boluses.  Pt inquired if she needed to use PMSV to eat  - SlP encouraged its use -but pt was test WITHOUT PMSV and her swallow was functional - thus she is not required to use it.      HPI HPI: 59yo female admitted 01/18/20 with acute worsening of abdominal pain. 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  Pt with perforation of sigmoid colon requiring surgery.  Required intubation - now with trach  - off vent for a day - and has large bore NG tube in place.  PMSV trials started yesterday and swallow evaluation desired.      SLP Plan  Continue with current plan of care       Recommendations  Diet recommendations: Dysphagia 3 (mechanical soft);Thin liquid------------------Regular/thin when CCS indicates Liquids provided via: Straw;Cup Medication Administration: Crushed with puree or crushed with  water Supervision: Patient able to self feed Compensations: Slow rate;Small sips/bites(pmsv does NOT Need to be in place) Postural Changes and/or Swallow Maneuvers: Seated upright 90 degrees;Upright 30-60 min after meal                SLP Visit Diagnosis: Dysphagia, oral phase (R13.11) Plan: Continue with current plan of care       GO               [name], MS [facility] SLP Acute Rehab Services Office [phone]  [name] 02/13/2020, 4:06 PM

## 2020-02-13 NOTE — Progress Notes (Signed)
Patient ID: Darlene Castillo, female   DOB: [birth year], 59 y.o.   MRN: [id number]    26 Days Post-Op  Subjective:  Patient doing well today.  Transferred to ward, on trach collar.  Cleared swallow, tolerating clears and subsequent soft yesterday; feeling well. Denies abdominal pain, nausea or emesis.    Objective: Vital signs in last 24 hours: Temp:  [97.8 F (36.6 C)-98.9 F (37.2 C)] 98.7 F (37.1 C) (05/31 0529) Pulse Rate:  [58-76] 64 (05/31 0529) Resp:  [14-18] 14 (05/31 0529) BP: (116-169)/(71-86) 118/74 (05/31 0529) SpO2:  [93 %-100 %] 97 % (05/31 0529) FiO2 (%):  [28 %] 28 % (05/31 0403) Last BM Date: 02/12/20  Intake/Output from previous day: 05/30 0701 - 05/31 0700 In: 766.6 [P.O.:420; I.V.:346.6] Out: 400 [Urine:400] Intake/Output this shift: No intake/output data recorded.  PE: Abd: soft, midline wound is clean, packed.   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 CMP     Component Value Date/Time   NA 135 02/11/2020 0500   K 3.2 (L) 02/11/2020 0500   CL 90 (L) 02/11/2020 0500   CO2 34 (H) 02/11/2020 0500   GLUCOSE 109 (H) 02/11/2020 0500   BUN 12 02/11/2020 0500   CREATININE 0.42 (L) 02/11/2020 0500   CALCIUM 9.0 02/11/2020 0500   PROT 7.4 02/09/2020 0640   ALBUMIN 2.3 (L) 02/09/2020 0640   AST 25 02/09/2020 0640   ALT 27 02/09/2020 0640   ALKPHOS 127 (H) 02/09/2020 0640   BILITOT 0.3 02/09/2020 0640   GFRNONAA >60 02/11/2020 0500   GFRAA >60 02/11/2020 0500   Lipase     Component Value Date/Time   LIPASE 19 01/18/2020 0525       Studies/Results: No results found.  Anti-infectives: Anti-infectives (From admission, onward)   Start      Dose/Rate Route Frequency Ordered Stop   02/04/20 1400  meropenem (MERREM) 1 g in sodium chloride 0.9 % 100 mL IVPB     1 g 200 mL/hr over 30 Minutes Intravenous Every 8 hours 02/04/20 1245 02/10/20 2300   02/02/20 0800  ceFEPIme (MAXIPIME) 2 g in sodium chloride 0.9 % 100 mL IVPB  Status:  Discontinued     2 g 200 mL/hr over 30 Minutes Intravenous Every 8 hours 02/02/20 0727 02/04/20 1245   02/01/20 1200  anidulafungin (ERAXIS) 100 mg in sodium chloride 0.9 % 100 mL IVPB  Status:  Discontinued     100 mg 78 mL/hr over 100 Minutes Intravenous Every 24 hours 01/31/20 1034 02/02/20 0942   02/01/20 0800  piperacillin-tazobactam (ZOSYN) IVPB 3.375 g  Status:  Discontinued     3.375 g 12.5 mL/hr over 240 Minutes Intravenous Every 8 hours 02/01/20 0735 02/02/20 0721   01/31/20 1200  anidulafungin (ERAXIS) 200 mg in sodium chloride 0.9 % 200 mL IVPB     200 mg 78 mL/hr over 200 Minutes Intravenous  Once 01/31/20 1034 01/31/20 1730   01/27/20 1200  cefTRIAXone (ROCEPHIN) 2 g in sodium chloride 0.9 % 100 mL IVPB  Status:  Discontinued     2 g  200 mL/hr over 30 Minutes Intravenous Every 24 hours 01/27/20 1041 02/01/20 0735   01/26/20 1000  anidulafungin (ERAXIS) 100 mg in sodium chloride 0.9 % 100 mL IVPB  Status:  Discontinued     100 mg 78 mL/hr over 100 Minutes Intravenous Every 24 hours 01/25/20 0956 01/27/20 1041   01/25/20 1100  anidulafungin (ERAXIS) 200 mg in sodium chloride 0.9 % 200 mL IVPB     200 mg 78 mL/hr over 200 Minutes Intravenous  Once 01/25/20 1006 01/25/20 1525   01/18/20 1400  piperacillin-tazobactam (ZOSYN) IVPB 3.375 g  Status:  Discontinued     3.375 g 12.5 mL/hr over 240 Minutes Intravenous Every 8 hours 01/18/20 1311 01/27/20 1041   01/18/20 0715  piperacillin-tazobactam (ZOSYN) IVPB 3.375 g     3.375 g 100 mL/hr over 30 Minutes Intravenous  Once 01/18/20 0707 01/18/20 0837       Assessment/Plan HTN Prediabetic- A1c6.3,SSI Tobacco abuse 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 On Merrem for pseudomonal PNA, completed 8 days of treatment Fever -no fever for 24 hrs Anemia- tx 1 unit of pRBCs 5/18, 5/23. stable R brachial DVT-eliquis  Septic shock Sigmoid colon perforation secondary to chronic constipation and ulceration with feculent peritonitis and gross contamination of abdominal cavity with stool balls -S/pExploratory laparotomy with partial colectomy and end colostomy5/5 Dr. Luisa Hart - POD#26 - soft diet as tolerated today  -continue BID WD dressing changes  -therapies   ID -Merrem - PNA, should complete within the next 1-2 days FEN -soft diet VTE -SCDs,eliquis Foley -d/c 5/13 Dispo - patient seems to be progressing on trach collar, would like to go home with her mother, will continue to see how she progresses. Monitor today   LOS: 26 days   Marin Olp, M.D. [facility]

## 2020-02-13 NOTE — Progress Notes (Signed)
Physical Therapy Treatment Patient Details Name: Darlene Castillo MRN: [id number] DOB: 1961-09-03 Today's Date: 02/13/2020    History of Present Illness 59 yo female admitted 01/18/20 with the diagnosis of pneumoperitoneum, peritonitis,  perforated sigmoid colon status post ex lap with colectomy and ostomy remains on the ventilator/trach.  Unable to wean successfully d/t ARF with hypoxemia in setting of major abdominal surgery and acute pulmonary edema.PMH: tobacco use, HTN    PT Comments    Pt assisted with sitting EOB.  Pt able to sit without any support for approx 3-4 minutes and then ambulated in hallway.  Pt tolerated improved distance of 50 feet with RW today.  Pt requires at least min assist at this time and would benefit from post acute rehab upon d/c.    Follow Up Recommendations  Supervision/Assistance - 24 hour;CIR(if not CIR then SNF)     Equipment Recommendations  Rolling walker with 5" wheels    Recommendations for Other Services       Precautions / Restrictions Precautions Precautions: Fall Precaution Comments: trach collar 28%, abd wound, colostomy,    Mobility  Bed Mobility Overal bed mobility: Needs Assistance Bed Mobility: Supine to Sit;Sit to Supine Rolling: Min assist   Supine to sit: Min assist     General bed mobility comments: lines prepared prior to mobilizing; hand hold assist for trunk upright, slight assist for LE onto bed due to fatigue  Transfers Overall transfer level: Needs assistance Equipment used: Rolling walker (2 wheeled) Transfers: Sit to/from Stand Sit to Stand: Min assist         General transfer comment: assist to rise and steady, verbal cues for technique  Ambulation/Gait Ambulation/Gait assistance: Min guard Gait Distance (Feet): 50 Feet Assistive device: Rolling walker (2 wheeled) Gait Pattern/deviations: Step-through pattern;Decreased stride length;Narrow base of support;Scissoring     General Gait Details: min/guard for  safety with narrow BOS and occasional scissoring however pt utilized RW for support, remained on 28% trach collar and Spo2 93% upon returning to room   Stairs             Wheelchair Mobility    Modified Rankin (Stroke Patients Only)       Balance                                            Cognition Arousal/Alertness: Awake/alert Behavior During Therapy: WFL for tasks assessed/performed Overall Cognitive Status: Difficult to assess                                 General Comments: communicates appropriately with writing and gestures, cooperative today with therapy; appeared a little frustrated with ?significant other (who came and then left during session)      Exercises      General Comments        Pertinent Vitals/Pain Pain Assessment: 0-10 Pain Score: 5  Pain Location: back Pain Descriptors / Indicators: Sore Pain Intervention(s): Monitored during session;Repositioned;Patient requesting pain meds-RN notified    Home Living                      Prior Function            PT Goals (current goals can now be found in the care plan section) Progress towards PT goals: Progressing toward goals  Frequency    Min 3X/week      PT Plan Current plan remains appropriate    Co-evaluation              AM-PAC PT "6 Clicks" Mobility   Outcome Measure  Help needed turning from your back to your side while in a flat bed without using bedrails?: A Little Help needed moving from lying on your back to sitting on the side of a flat bed without using bedrails?: A Little Help needed moving to and from a bed to a chair (including a wheelchair)?: A Little Help needed standing up from a chair using your arms (e.g., wheelchair or bedside chair)?: A Little Help needed to walk in hospital room?: A Lot Help needed climbing 3-5 steps with a railing? : A Lot 6 Click Score: 16    End of Session Equipment Utilized During  Treatment: Gait belt;Oxygen Activity Tolerance: Patient tolerated treatment well Patient left: in bed;with call bell/phone within reach;with bed alarm set Nurse Communication: Mobility status;Patient requests pain meds PT Visit Diagnosis: Other abnormalities of gait and mobility (R26.89);Muscle weakness (generalized) (M62.81)     Time: 1000-1036 PT Time Calculation (min) (ACUTE ONLY): 36 min  Charges:  $Gait Training: 8-22 mins $Therapeutic Activity: 8-22 mins                     Arlyce Dice, DPT Acute Rehabilitation Services Office: [phone]  Darlene Castillo 02/13/2020, 12:33 PM

## 2020-02-13 NOTE — Progress Notes (Signed)
  Speech Language Pathology Treatment: [name] Speaking valve  Patient Details Name: Darlene Castillo MRN: [id number] DOB: 1961/07/05 Today's Date: 02/13/2020 Time: 2952-8413 SLP Time Calculation (min) (ACUTE ONLY): 29 min  Assessment / Plan / Recommendation Clinical Impression  Today pt seen for PMSV separately given her anxiety re: its use.  With finger occlusion of trach, pt able to voice easily.  Upon placement of valve pt was shocked to hear her voice and became tearful. No severe CO2 retention apparent and all vitals remained stable during entire session.  Her voice was clear but mildly weak.  Last Thursday with PMSV attempts, pt appeared to barely achieve voice with gravely phonation.    SLP requested pt contact family and she FaceTimed her daughter and her grandchildren.  With moderate cueing, pt able to increase her phonatory strength to improve listener understanding.   She appeared surprised when she was able to loudly state "I love you" to her family and again became tearful at her progress!  SLP then had pt clean her hands and taught her to don and doff valve using mirror x2 and independently x2. Pt advised SLP that she is comfortable with use of the valve at this time.   Using teach back, SLP reviewed need to remove valve if going to sleep or get breathing tx and if if short of breath or can't cough and expectorate secretions orally.  She inquired if she "needed to wear it all the time?". SLP encouraged her to use it as much as possible to help her respiratory system to adjust to more of a natural system and to potentially decrease time to decannulation.  Also provided pt with Larena Sox for oral use and she returned demonstration of proper usage with ease.   Pt is making excellent progress today with her valve use and appears to be tolerating po well.  Recommend use of PMSV with intermittent supervision.     HPI HPI: 59yo female admitted 01/18/20 with acute worsening of abdominal pain. PMH:  HTN, ?DM, tobacco abuse, prior heroin use now on methadone.  Pt with perforation of sigmoid colon requiring surgery.  Required intubation - now with trach  - off vent for a day - and has large bore NG tube in place.  PMSV trials started yesterday and swallow evaluation desired.      SLP Plan  Continue with current plan of care       Recommendations  Diet recommendations: Dysphagia 3 (mechanical soft);Thin liquid Liquids provided via: Straw;Cup Medication Administration: Crushed with puree Supervision: Patient able to self feed Compensations: Slow rate;Small sips/bites(pmsv does NOT Need to be in place) Postural Changes and/or Swallow Maneuvers: Seated upright 90 degrees;Upright 30-60 min after meal      Patient may use Passy-Muir Speech Valve: During all waking hours (remove during sleep)(during all therapies, pt able to don and doff independently) PMSV Supervision: Intermittent MD: Please consider changing trach tube to : Cuffless(? plans to decannulate)         Oral Care Recommendations: Oral care BID Follow up Recommendations: 24 hour supervision/assistance SLP Visit Diagnosis: Aphonia (R49.1) Plan: Continue with current plan of care       GO                [name] 02/13/2020, 4:22 PM   [name], MS [facility] SLP Acute Rehab Services Office [phone]

## 2020-02-14 ENCOUNTER — Inpatient Hospital Stay (HOSPITAL_COMMUNITY): Payer: BLUE CROSS/BLUE SHIELD

## 2020-02-14 MED ORDER — ALBUTEROL SULFATE (2.5 MG/3ML) 0.083% IN NEBU: 2.5000 mg | INHALATION_SOLUTION | Freq: Four times a day (QID) | RESPIRATORY_TRACT | Status: DC | PRN

## 2020-02-14 NOTE — Discharge Instructions (Signed)
MIDLINE WOUND CARE: - midline dressing to be changed once daily - supplies: sterile saline, kerlix, scissors, ABD pads, tape  - remove dressing and all packing carefully, moistening with sterile saline as needed to avoid packing/internal dressing sticking to the wound. - clean edges of skin around the wound with water/gauze, making sure there is no tape debris or leakage left on skin that could cause skin irritation or breakdown. - dampen and clean kerlix with sterile saline and pack wound from wound base to skin level, making sure to take note of any possible areas of wound tracking, tunneling and packing appropriately. Wound can be packed loosely. Trim kerlix to size if a whole kerlix is not required. - cover wound with a dry ABD pad and secure with tape.  - write the date/time on the dry dressing/tape to better track when the last dressing change occurred. - apply any skin protectant/powder recommended by clinician to protect skin/skin folds. - change dressing as needed if leakage occurs, wound gets contaminated, or patient requests to shower. - patient may shower daily with wound open and following the shower the wound should be dried and a clean dressing placed.   CCS      [facility], [place] [phone]  OPEN ABDOMINAL SURGERY: POST OP INSTRUCTIONS  Always review your discharge instruction sheet given to you by the facility where your surgery was performed.  IF YOU HAVE DISABILITY OR FAMILY LEAVE FORMS, YOU MUST BRING THEM TO THE OFFICE FOR PROCESSING.  PLEASE DO NOT GIVE THEM TO YOUR DOCTOR.  1. A prescription for pain medication may be given to you upon discharge.  Take your pain medication as prescribed, if needed.  If narcotic pain medicine is not needed, then you may take acetaminophen (Tylenol) or ibuprofen (Advil) as needed. 2. Take your usually prescribed medications unless otherwise directed. 3. If you need a refill on your pain medication, please contact your  pharmacy. They will contact our office to request authorization.  Prescriptions will not be filled after 5pm or on week-ends. 4. You should follow a light diet the first few days after arrival home, such as soup and crackers, pudding, etc.unless your doctor has advised otherwise. A high-fiber, low fat diet can be resumed as tolerated.   Be sure to include lots of fluids daily. Most patients will experience some swelling and bruising on the chest and neck area.  Ice packs will help.  Swelling and bruising can take several days to resolve 5. Most patients will experience some swelling and bruising in the area of the incision. Ice pack will help. Swelling and bruising can take several days to resolve..  6. It is common to experience some constipation if taking pain medication after surgery.  Increasing fluid intake and taking a stool softener will usually help or prevent this problem from occurring.  A mild laxative (Milk of Magnesia or Miralax) should be taken according to package directions if there are no bowel movements after 48 hours. 7.  You may have steri-strips (small skin tapes) in place directly over the incision.  These strips should be left on the skin for 7-10 days.  If your surgeon used skin glue on the incision, you may shower in 24 hours.  The glue will flake off over the next 2-3 weeks.  Any sutures or staples will be removed at the office during your follow-up visit. You may find that a light gauze bandage over your incision may keep your staples from being rubbed or pulled.  You may shower and replace the bandage daily. 8. ACTIVITIES:  You may resume regular (light) daily activities beginning the next day--such as daily self-care, walking, climbing stairs--gradually increasing activities as tolerated.  You may have sexual intercourse when it is comfortable.  Refrain from any heavy lifting or straining until approved by your doctor. a. You may drive when you no longer are taking prescription pain  medication, you can comfortably wear a seatbelt, and you can safely maneuver your car and apply brakes  9. You should see your doctor in the office for a follow-up appointment approximately two weeks after your surgery.  Make sure that you call for this appointment within a day or two after you arrive home to insure a convenient appointment time.   WHEN TO CALL YOUR DOCTOR: 1. Fever over 101.0 2. Inability to urinate 3. Nausea and/or vomiting 4. Extreme swelling or bruising 5. Continued bleeding from incision. 6. Increased pain, redness, or drainage from the incision. 7. Difficulty swallowing or breathing 8. Muscle cramping or spasms. 9. Numbness or tingling in hands or feet or around lips.  The clinic staff is available to answer your questions during regular business hours.  Please don't hesitate to call and ask to speak to one of the nurses if you have concerns.  For further questions, please visit [email or web address]   Colostomy Home Guide, Adult  Colostomy surgery is done to create an opening in the front of the abdomen for stool (feces) to leave the body through an ostomy (stoma). Part of the large intestine is attached to the stoma. A bag, also called a pouch, is fitted over the stoma. Stool and gas will collect in the bag. After surgery, you will need to empty and change your colostomy bag as needed. You will also need to care for your stoma. How to care for the stoma Your stoma should look pink, red, and moist, like the inside of your cheek. Soon after surgery, the stoma may be swollen, but this swelling will go away within 6 weeks. To care for the stoma:  Keep the skin around the stoma clean and dry.  Use a clean, soft washcloth to gently wash the stoma and the skin around it. Clean using a circular motion, and wipe away from the stoma opening, not toward it. ? Use warm water and only use cleansers recommended by your health care provider. ? Rinse the stoma area  with plain water. ? Dry the area around the stoma well.  Use stoma powder or ointment on your skin only as told by your health care provider. Do not use any other powders, gels, wipes, or creams on the skin around the stoma.  Check the stoma area every day for signs of infection. Check for: ? New or worsening redness, swelling, or pain. ? New or increased fluid or blood. ? Pus or warmth.  Measure the stoma opening regularly and record the size. Watch for changes. (It is normal for the stoma to get smaller as swelling goes away.) Share this information with your health care provider. How to empty the colostomy bag  Empty your bag at bedtime and whenever it is one-third to one-half full. Do not let the bag get more than half-full with stool or gas. The bag could leak if it gets too full. Some colostomy bags have a built-in gas release valve that releases gas often throughout the day. Follow these basic steps: 1. Wash your hands with soap and water. 2. Sit far back on  the toilet seat. 3. Put several pieces of toilet paper into the toilet water. This will prevent splashing as you empty stool into the toilet. 4. Remove the clip or the hook-and-loop fastener from the tail end of the bag. 5. Unroll the tail, then empty the stool into the toilet. 6. Clean the tail with toilet paper or a moist towelette. 7. Reroll the tail, and close it with the clip or the hook-and-loop fastener. 8. Wash your hands again. How to change the colostomy bag Change your bag every 3-4 days or as often as told by your health care provider. Also change the bag if it is leaking or separating from the skin, or if your skin around the stoma looks or feels irritated. Irritated skin may be a sign that the bag is leaking. Always have colostomy supplies with you, and follow these basic steps: 1. Wash your hands with soap and water. Have paper towels or tissues nearby to clean any discharge. 2. Remove the old bag and skin  barrier. Use your fingers or a warm cloth to gently push the skin away from the barrier. 3. Clean the stoma area with water or with mild soap and water, as directed. Use water to rinse away any soap. 4. Dry the skin. You may use the cool setting on a hair dryer to do this. 5. Use a tracing pattern (template) to cut the skin barrier to the size needed. 6. If you are using a two-piece bag, attach the bag and the skin barrier to each other. Add the barrier ring, if you use one. 7. If directed, apply stoma powder or skin barrier gel to the skin. 8. Warm the skin barrier with your hands, or blow with a hair dryer for 5-10 seconds. 9. Remove the paper from the adhesive strip of the skin barrier. 10. Press the adhesive strip onto the skin around the stoma. 11. Gently rub the skin barrier onto the skin. This creates heat that helps the barrier to stick. 12. Apply stoma tape to the edges of the skin barrier, if desired. 13. Wash your hands again. General recommendations  Avoid wearing tight clothes or having anything press directly on your stoma or bag. Change your clothing whenever it is soiled or damp.  You may shower or bathe with the bag on or off. Do not use harsh or oily soaps or lotions. Dry the skin and bag after bathing.  Store all supplies in a cool, dry place. Do not leave supplies in extreme heat because some parts can melt or not stick as well.  Whenever you leave home, take extra clothing and an extra skin barrier and bag with you.  If your bag gets wet, you can dry it with a hair dryer on the cool setting.  To prevent odor, you may put drops of ostomy deodorizer in the bag.  If recommended by your health care provider, put ostomy lubricant inside the bag. This helps stool to slide out of the bag more easily and completely. Contact a health care provider if:  You have new or worsening redness, swelling, or pain around your stoma.  You have new or increased fluid or blood coming  from your stoma.  Your stoma feels warm to the touch.  You have pus coming from your stoma.  Your stoma extends in or out farther than normal.  You need to change your bag every day.  You have a fever. Get help right away if:  Your stool is bloody.  You have nausea or you vomit.  You have trouble breathing. Summary  Measure your stoma opening regularly and record the size. Watch for changes.  Empty your bag at bedtime and whenever it is one-third to one-half full. Do not let the bag get more than half-full with stool or gas.  Change your bag every 3-4 days or as often as told by your health care provider.  Whenever you leave home, take extra clothing and an extra skin barrier and bag with you. This information is not intended to replace advice given to you by your health care provider. Make sure you discuss any questions you have with your health care provider. Document Revised: 12/22/2018 Document Reviewed: 02/25/2017 Elsevier Patient Education  [place].

## 2020-02-14 NOTE — Progress Notes (Signed)
NAME:  Darlene Castillo, MRN:  [id number], DOB:  [birth year], LOS: 27 ADMISSION DATE:  01/18/2020, CONSULTATION DATE:  01/18/20 REFERRING MD:  Harriette Bouillon CHIEF COMPLAINT:  VDRF, septic shock  Brief History   43  yobf active smoker admitted 5/5 with abdominal pain, constipation and one episode of vomiting. Found to have a perforated sigmoid colon with peritonitis.  To OR for exploratory laparotomy with partial colectomy and end colostomy (negative pathology), returned to ICU post-operatively on mechanical ventilation. Prolonged respiratory failure s/p tracheostomy 5/19.  Difficult to control agitation / pain while in ICU.  Weaned from vent 5/26.    Past Medical History  Chronic opiate use Tobacco abuse DM HTN  Significant Hospital Events   5/05 Admit, ex lap with partial colectomy and end colostomy for perf:  neg path 5/11 Extubated 5/12 Failed extubation.  Initially DNI but reversed.   5/13  Back on vent. Fever curve better. Concern for A Fib but EKG sinus tach with PAC. Down to 40% fio2 . TF on hold but getting meds via NG. On TPN. On dilaudid gtt, scheduled methadone, versed gtt, clonidine, seroquel and nicotine patch. Started on IV heparin for RUE DVT  5/14 dc versed gtt. Working on weaning sedation. Changing abx to rocephin 5/15 Methadone held at family request  5/16 Restarted methadone due to significant agitation 5/18 Agitation overnight, sedation gtt's increased 5/19 Trach.  Dilaudid stopped. Intra stomal enema  5/20 Sedation reduction  5/21 On PSV wean 10/5.  TF on hold with emesis overnight.  5/22 PSV wean, diuresis with 1.5L UOP.  Nausea.  5/23 PRVC 1 unit for Hgb 6.9 5/24 Up in chair, weaned on PSV  5/25 PSV wean ~ 4 hours, then ATC ~ 8 hours 5/26 PSV to open ended ATC  5/28 Remains on ATC 5/30 transferred to floor/ PMV training ongoing and advanced to soft mech diet   Consults:  CCS PCCM  Procedures:  R IJ CVL 5/5 >> 5/6 ALine 5/5 >> 5/7  ETT 5/5 >> 5/11, replaced  5/12 >> 5/19  Trach 5/19 >> LUE PICC 5/6 >>  Significant Diagnostic Tests:   CT abdomen pelvis with contrast 01/17/2020 >> scattered groundglass opacities in the lower lungs bilaterally, no focal opacities.  Abdominal free air.  Multiple dilated loops of bowel with stool.  Large R renal cyst.  Path 5/5 >> Perforation with acute inflammation and acute serositis. No evidence of malignancy.    [place] UE 5/13 >> RUE DVT  CT ABD / Pelvis w Contrast 5/17 >> increasingly organized apearance of LLQ & pelvic fluid with slight decrease in the deep pelvic fluid.  Slight increase in LLQ fluid. Interloop fluid in the left hemiabdomen is slightly increased overall.  Diffuse interloop fluid and mesenteric stranding is slightly diminished. Persistent small areas of loculated perihepatic fluid unchanged, largest area in the axial plane measureing 2.5 x 1.7 cm. Decreased formed stool in the colon but with slight increase in colonic distention particularly of the transverse colon.  May reflect colonic ileus.  Persistent bibasilar consolidation and pleural effusions without significant change.   CT Sinus 5/20 >> sinus mucosal edema, bilateral mastoid effusions, no air fluid levels, no abscess in neck  MBS 5/28 >>  SLP Diet Recommendations: Thin liquid(clears - will follow up for advancement re: solids   Micro Data:  Covid 5/5 >> negative Flu PCR 5/5 >> negative MRSA PCR 5/12 >> neg Tracheal aspirate 5/12 >> klebsiella > R ampicillin, Unasyn, I: zosyn Urine analysis 5/12 >>  negative  Blood culture 5/12 >> negative  UC 5/19 >> negative  BCx2 5/18 >> negative  Tracheal Aspirate 5/20 >> pseudomonas aeruginosa >> I-ceftazidime, cefepime.  S-imipenem, cipro Tracheal Aspirate 5/26 >> gm stain pmns rare years > culture = few pseudomonas sensitive to cipro BC x 1 5/26  >> neg Urine 5/27 >> no significant growth  Antimicrobials:  Zosyn 5/5 >> 5/14 Eraxis 5/12 >> 5/14 Rocephin 5/14 >> Eraxis 5/18 >> 5/20 Zosyn 5/19  >> 5/19 Cefepime 5/20 >> 5/22 Meropenem 5/22 >> 5/28     Interim history/subjective:  Doing great   Objective   Blood pressure (Abnormal) 152/84, pulse 79, temperature 97.6 F (36.4 C), resp. rate 16, height 5\' 1"  (1.549 m), weight 62.7 kg, last menstrual period 05/23/2013, SpO2 99 %.    FiO2 (%):  [28 %] 28 %   Intake/Output Summary (Last 24 hours) at 02/14/2020 1617 Last data filed at 02/14/2020 0400 Gross per 24 hour  Intake 10 ml  Output 450 ml  Net -440 ml   Filed Weights   02/10/20 0500 02/11/20 0500 02/14/20 0500  Weight: 61.5 kg 60.4 kg 62.7 kg   Exam: General: This is a pleasant 59 year old female she is sitting up in the chair and  in no acute distress HEENT: Normocephalic atraumatic size 6 cuffed tracheostomy is in place.  The stoma is unremarkable.  The cuff is deflated, PMV is in place, her phonation quality is excellent, cough mechanics strong.  Tolerates tracheal occlusion well Pulmonary: Some scattered rhonchi, no accessory use.  Breathing comfortably. Card RRR abd soft not tender + tol diet  Ext warm and dry  Neuro awake alert no focal def   Resolved problems  Circulatory/septic shock Mild Elevation of LFT's  Acute Metabolic Encephalopathy, started 5/12- resolved 5//24 Acute hypoxic respiratory failure requiring mechanical ventilation, complicated by Klebsiella PNA.  Peritonitis 2/2 Sigmoid Colon Perforation from Chronic Constipation s/p ex lap with sigmoid resection and colostomy. Assessment & Plan:  LOS 27 days    Tracheostomy dependence s/p prolonged resp failure  Chronic Pain, on oral methadone PTA Right Brachial DVT Moderate Malnutrition Intermittent Fluid and Electrolyte imbalance - hypokalemia  Hyperglycemia Anemia    Discussion  Tracheostomy dependent following prolonged critical illness.  She has made remarkable improvement from a pulmonary standpoint.  Evaluating for speech-language pathology notes she is doing well with phonation,  swallowing, and tolerating Passy-Muir valve.  She also tolerates tracheal occlusion at bedside well.  I do not think she will need the tracheostomy any longer.  Plan We will downsize her to a size 4 cuffless tracheostomy Initiate capping trials Continue with pulse oximetry during capping trials If she tolerates this well we can decannulate her in the next 48 hours  Best practice:  Per primary   Erick Colace ACNP-BC [place] Pager # [phone] OR # [phone] if no answer

## 2020-02-14 NOTE — TOC Benefit Eligibility Note (Signed)
Transition of Care [facility]) Benefit Eligibility Note    Patient Details  Name: Darlene Castillo MRN: [id number] Date of Birth: [birth year]   Medication/Dose: NA  Covered?: Yes     Prescription Coverage Preferred Pharmacy: NA  Spoke with Person/Company/Phone Number:: Vonna Kotyk L./ [employer] [place] [phone]  Co-Pay: NA  Prior Approval: (NA)     Additional Notes: Rep. states that Case Manager's are only assigned to Transplant Patients    Caren Macadam Phone Number: 02/14/2020, 4:00 PM

## 2020-02-14 NOTE — Progress Notes (Signed)
[facility] Progress Note  27 Days Post-Op  Subjective: Patient tolerating diet and having bowel function. Discussed updated therapy recommendations for SNF or CIR and patient still prefers to go home with mother.   Objective: Vital signs in last 24 hours: Temp:  [97.8 F (36.6 C)-99.1 F (37.3 C)] 99.1 F (37.3 C) (06/01 0440) Pulse Rate:  [64-74] 74 (06/01 0750) Resp:  [16-19] 16 (06/01 0750) BP: (109-132)/(63-65) 127/63 (06/01 0440) SpO2:  [96 %-99 %] 98 % (06/01 0750) FiO2 (%):  [28 %] 28 % (06/01 0750) Weight:  [62.7 kg] 62.7 kg (06/01 0500) Last BM Date: 02/13/20  Intake/Output from previous day: 05/31 0701 - 06/01 0700 In: 50 [I.V.:50] Out: 450 [Urine:450] Intake/Output this shift: No intake/output data recorded.  PE: Abd: soft, midline wound is clean and packed.  +BS, Colostomy working well.   Lab Results:  No results for input(s): WBC, HGB, HCT, PLT in the last 72 hours. BMET No results for input(s): NA, K, CL, CO2, GLUCOSE, BUN, CREATININE, CALCIUM in the last 72 hours. PT/INR No results for input(s): LABPROT, INR in the last 72 hours. CMP     Component Value Date/Time   NA 135 02/11/2020 0500   K 3.2 (L) 02/11/2020 0500   CL 90 (L) 02/11/2020 0500   CO2 34 (H) 02/11/2020 0500   GLUCOSE 109 (H) 02/11/2020 0500   BUN 12 02/11/2020 0500   CREATININE 0.42 (L) 02/11/2020 0500   CALCIUM 9.0 02/11/2020 0500   PROT 7.4 02/09/2020 0640   ALBUMIN 2.3 (L) 02/09/2020 0640   AST 25 02/09/2020 0640   ALT 27 02/09/2020 0640   ALKPHOS 127 (H) 02/09/2020 0640   BILITOT 0.3 02/09/2020 0640   GFRNONAA >60 02/11/2020 0500   GFRAA >60 02/11/2020 0500   Lipase     Component Value Date/Time   LIPASE 19 01/18/2020 0525       Studies/Results: DG Chest Port 1 View  Result Date: 02/14/2020 CLINICAL DATA:  Respiratory failure EXAM: PORTABLE CHEST 1 VIEW COMPARISON:  Feb 10, 2019 FINDINGS: The heart size and mediastinal contours are unchanged with mild  cardiomegaly. Tracheostomy tube seen at the level of the clavicular heads. There is prominence of the central pulmonary vasculature which is improved from the prior exam. Small bilateral effusions are again noted. IMPRESSION: Slight interval improvement in the pulmonary edema and small bilateral effusions. Electronically Signed   By: Prudencio Pair M.D.   On: 02/14/2020 05:06    Anti-infectives: Anti-infectives (From admission, onward)   Start     Dose/Rate Route Frequency Ordered Stop   02/04/20 1400  meropenem (MERREM) 1 g in sodium chloride 0.9 % 100 mL IVPB     1 g 200 mL/hr over 30 Minutes Intravenous Every 8 hours 02/04/20 1245 02/10/20 2300   02/02/20 0800  ceFEPIme (MAXIPIME) 2 g in sodium chloride 0.9 % 100 mL IVPB  Status:  Discontinued     2 g 200 mL/hr over 30 Minutes Intravenous Every 8 hours 02/02/20 0727 02/04/20 1245   02/01/20 1200  anidulafungin (ERAXIS) 100 mg in sodium chloride 0.9 % 100 mL IVPB  Status:  Discontinued     100 mg 78 mL/hr over 100 Minutes Intravenous Every 24 hours 01/31/20 1034 02/02/20 0942   02/01/20 0800  piperacillin-tazobactam (ZOSYN) IVPB 3.375 g  Status:  Discontinued     3.375 g 12.5 mL/hr over 240 Minutes Intravenous Every 8 hours 02/01/20 0735 02/02/20 0721   01/31/20 1200  anidulafungin (ERAXIS) 200 mg in  sodium chloride 0.9 % 200 mL IVPB     200 mg 78 mL/hr over 200 Minutes Intravenous  Once 01/31/20 1034 01/31/20 1730   01/27/20 1200  cefTRIAXone (ROCEPHIN) 2 g in sodium chloride 0.9 % 100 mL IVPB  Status:  Discontinued     2 g 200 mL/hr over 30 Minutes Intravenous Every 24 hours 01/27/20 1041 02/01/20 0735   01/26/20 1000  anidulafungin (ERAXIS) 100 mg in sodium chloride 0.9 % 100 mL IVPB  Status:  Discontinued     100 mg 78 mL/hr over 100 Minutes Intravenous Every 24 hours 01/25/20 0956 01/27/20 1041   01/25/20 1100  anidulafungin (ERAXIS) 200 mg in sodium chloride 0.9 % 200 mL IVPB     200 mg 78 mL/hr over 200 Minutes Intravenous  Once  01/25/20 1006 01/25/20 1525   01/18/20 1400  piperacillin-tazobactam (ZOSYN) IVPB 3.375 g  Status:  Discontinued     3.375 g 12.5 mL/hr over 240 Minutes Intravenous Every 8 hours 01/18/20 1311 01/27/20 1041   01/18/20 0715  piperacillin-tazobactam (ZOSYN) IVPB 3.375 g     3.375 g 100 mL/hr over 30 Minutes Intravenous  Once 01/18/20 0707 01/18/20 0837       Assessment/Plan HTN Prediabetic- A1c6.3,SSI Tobacco abuse 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 On Merrem for pseudomonal PNA, completed 8 days of treatment Fever -no fever for 24 hrs Anemia- tx 1 unit of pRBCs 5/18, 5/23. stable R brachial DVT-eliquis  Septic shock Sigmoid colon perforation secondary to chronic constipation and ulceration with feculent peritonitis and gross contamination of abdominal cavity with stool balls -S/pExploratory laparotomy with partial colectomy and end colostomy5/5 Dr. Luisa Hart - POD#27 - soft diet              - continue daily WD dressing changes             - therapies   ID -Merrem - PNA completed FEN -soft diet VTE -SCDs,eliquis Foley -d/c 5/13 Dispo -patient has opted to go home with her mother with home therapies. Will reach out to CCM about trach today. Will work on getting home needs set up. Anticipate patient being ready for discharge sometime this week.   LOS: 27 days    Juliet Rude , [facility] Surgery 02/14/2020, 9:39 AM Please see Amion for pager number during day hours 7:00am-4:30pm

## 2020-02-14 NOTE — Progress Notes (Signed)
Tracheostomy tube change: Informed verbal consent was obtained after explaining the risks (including bleeding and infection), benefits and alternatives of the procedure. Verbal timeout was performed prior to the procedure. The old  #6 cuffed trach was carefully removed. the tracheostomy site appeared: unremarkable. A new # 4 Cuffless trach was easily placed in the tracheostomy stoma and secured with velcro trach ties. The tracheostomy was patent, good color change observed via EZ-CAP, and the patient was easily able to voice with finger occlusion and tolerated the procedure well with no immediate complications.    Darlene Castillo E Mallory Enriques ACNP-BC [place] [facility] Pager # [phone] OR # [phone] if no answer  

## 2020-02-14 NOTE — Discharge Summary (Signed)
Physician Discharge Summary  Patient ID: Darlene Castillo MRN: [id number] DOB/AGE: 1961/07/29 59 y.o.  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 Marcello Moores Cornett 01/18/20 - exploratory laparotomy with partial colectomy and end colostomy Dr. Marshell Garfinkel 02/01/20 - percutaneous tracheostomy    HPI: Patient is a 59 year old female with PMH significant for HTN, ?DM, tobacco abuse, and prior h/o heroin use now on methadone for at least 15 years, who presented to [facility] complaining of acute worsening abdominal pain. Stated that she had been constipated and had poor PO intake for 5 days. Night prior to admission her pain became worse in her lower abdomen. Pain is now diffuse, constant, severe, and worse with palpation. Associated symptoms include nausea and multiple episodes emesis. She was pacing around the room. ED work up included CT scan which shows suspected frank discontinuity along the left lateral wall of the distal sigmoid with spillage of some of this inspissated feculent material and fluid into the low pelvis and rectovaginal pouch with surrounding free fluid and large volume of extraluminal gas with more intense mural thickening towards the rectosigmoid. WBC 8.8, afebrile.   Hospital Course: Patient was admitted and taken to the OR emergently. Patient was taken to the ICU post-operatively and remained intubated. Critical care was consulted for assistance in overall management. Patient started on TPN on POD#1 with expectation of prolonged ileus. Patient developed  fever POD#5 and CT abdomen pelvis was ordered, this was negative for abscess or intra-abdominal source. Patient was extubated 5/11 but required re-intubation  5/12 for respiratory distress. Patient felt to likely have VAP, antibiotics broadened. Respiratory culture grew pseudomonas and patient completed course of IV Merrem. Venous US done of all extremities 5/13 to rule out DVT as other source of fever and revealed right brachial DVT. Patient was started on IV heparin and eventually transitioned to Eliquis. Tube feeds were advanced with increase in bowel function but intermittently had to be decreased/held for persistent ileus. CT abdomen pelvis repeated 5/18 and showed significant stool burden in colon with colonic distention, stomal enema done to help clean out colon. Patient underwent tracheostomy 5/19. Patient was started on Movantik to help with gut motility in setting of chronic methadone. SLP evaluated patient for use of PMSV and initiation of diet, MBS done 5/28 and patient was started on liquid diet. Diet was advanced as tolerated. Patient was able to be transferred out of ICU on 5/31. Tracheostomy removed 6/2 and patient tolerated this well. PICC line removed 6/3. Patient required transfusion for low hemoglobin on 5/18 and 5/23 with appropriate response each time. Patient evaluated by PT/OT who initially recommended LTACH. Patient progressed and recommendation changed to SNF vs inpatient rehab, patient declined this and plans to return home with her mother who is a retired Therapist, sports. She is agreeable to continue home health therapies. Continuation of methadone and pain control post-operatively was discussed with patient's methadone clinic prior to discharge.   On 02/17/20, patient was tolerating a diet, having bowel function, pain well controlled, VSS and overall felt stable for discharge home with family. She will follow up as outlined below and knows to call with questions or concerns.  Physical  Exam: General: pleasant, WD, WN female who is laying in bed in NAD HEENT:  Sclera are noninjected. Ears and nose without any masses or lesions.  Mouth is pink and moist. Dressing to anterior neck c/d/i Heart: regular, rate, and rhythm.  Palpable radial and pedal pulses bilaterally Lungs: CTAB, no wheezes, rhonchi, or rales noted.  Respiratory effort nonlabored Abd: soft, midline wound is clean and packed. +BS,Colostomy working well. MS: all 4 extremities are symmetrical with no cyanosis, clubbing, or edema. Skin: warm and dry with no masses, lesions, or rashes Neuro: Cranial nerves 2-12 grossly intact, sensation is normal throughout Psych: A&Ox3 with an appropriate affect.  I or a member of my team have reviewed this patient in the Controlled Substance Database   Allergies as of 02/16/2020      Reactions   Ibuprofen Itching      Medication List    STOP taking these medications   amLODipine 10 MG tablet Commonly known as: NORVASC   furosemide 20 MG tablet Commonly known as: LASIX   lisinopril 40 MG tablet Commonly known as: ZESTRIL     TAKE these medications   apixaban 5 MG Tabs tablet Commonly known as: ELIQUIS Take 1 tablet (5 mg total) by mouth 2 (two) times daily.   cloNIDine 0.2 MG tablet Commonly known as: CATAPRES Take 1 tablet (0.2 mg total) by mouth 3 (three) times daily.   HYDROcodone-acetaminophen 7.5-325 mg/15 ml solution Commonly known as: HYCET Take 10 mLs by mouth every 4 (four) hours as needed for moderate pain.   melatonin 3 MG Tabs tablet Take 1 tablet (3 mg total) by mouth at bedtime.   methadone 1 MG/1ML solution Commonly known as: DOLOPHINE Take 27 mg by mouth daily.   metoprolol tartrate 25 MG tablet Commonly known as: LOPRESSOR Take 4 tablets (100 mg total) by mouth 2 (two) times daily. What changed: medication strength   olopatadine 0.1 % ophthalmic solution Commonly known as: PATANOL Place 1 drop into both eyes 2 (two) times daily as  needed (dry eyes).   polyethylene glycol 17 g packet Commonly known as: MIRALAX / GLYCOLAX Place 17 g into feeding tube daily. Start taking on: February 17, 2020   solifenacin 10 MG tablet Commonly known as: VESICARE Take 10 mg by mouth daily as needed (depends on fluid intake).   Symproic 0.2 MG Tabs Generic drug: Naldemedine Tosylate Take 0.2 mg by mouth daily.            Durable Medical Equipment  (From admission, onward)         Start     Ordered   02/14/20 1509  For home use only DME Walker rolling  Once    Question Answer Comment  Walker: With 5 Inch Wheels   Patient needs a walker to treat with the following condition Physical deconditioning      02/14/20 1508   02/14/20 1509  For home use only DME 3 n 1  Once     02/14/20 1508           Follow-up Information    Verlon Au, MD In 3 days.   Specialty: Family Medicine Contact information: [address] [place] [address] [phone]        Harriette Bouillon, MD. Go on 03/09/2020.   Specialty: General Surgery Why: Follow up appointment scheduled for 10:40 AM. Please arrive 30 min prior to appointment time. Bring photo ID and insurance information.  Contact information: [address] [place] [address] [phone]        [facility]. Call.   Specialty: Pulmonology Why: Call and schedule follow up regarding recent tracheostomy. Contact information: [address] [place] [address] [phone]       Services, [facility] Follow up.   Specialty: Behavioral Health Why: Continue to follow here for methadone Contact information: [address] [place] [address] [phone]           Signed: Juliet Rude , [facility] Surgery 02/16/2020, 11:19 AM Please see Amion for pager number during day hours 7:00am-4:30pm

## 2020-02-14 NOTE — Plan of Care (Signed)

## 2020-02-14 NOTE — TOC Progression Note (Signed)
Transition of Care [facility]) - Progression Note    Patient Details  Name: Darlene Castillo MRN: [id number] Date of Birth: July 23, 1961  Transition of [facility]) CM/SW Contact  Lennart Pall, LCSW Phone Number: 02/14/2020, 12:11 PM  Clinical Narrative:  Met with pt and spoke with her mother to review d/c plans.  Confirming with both that pt's preference is to d/c to her mother's home and arrangement of [facility] and therapies.  Mother is a retired Therapist, sports and plans to be here tomorrow ~ 9:30 am to received teaching on ostomy and trach care.  Will begin search for [facility] agency who can accept [facility].  TOC will continue to follow.  DC address (mother's home):  [place]    Expected Discharge Plan: [place] Barriers to Discharge: Continued Medical Work up  Expected Discharge Plan and Services Expected Discharge Plan: [place] arrangements for the past 2 months: Single Family Home                                       Social Determinants of Health (SDOH) Interventions    Readmission Risk Interventions No flowsheet data found.

## 2020-02-14 NOTE — Consult Note (Signed)
WOC Nurse ostomy follow up Patient would like mother Banker) to be present for next pouch change.  She can be here Wednesday (tomorrow ) at 1000  We will change pouch and perform teaching then.   Maple [place] MSN, RN, FNP-BC [facility] Wound, Ostomy, Continence Nurse Pager [phone]

## 2020-02-15 NOTE — Progress Notes (Signed)
  Speech Language Pathology Treatment: Dysphagia  Patient Details Name: Darlene Castillo MRN: [id number] DOB: April 30, 1961 Today's Date: 02/15/2020 Time: 8592-7639 SLP Time Calculation (min) (ACUTE ONLY): 10 min  Assessment / Plan / Recommendation Clinical Impression  Pt's trach was changed yesterday to a #4 cuffless and trach is capped today and tolerating well; O2 sats 91-93%. Vocal quality and intensity normal. She denied s/s aspiration with meals past few days and consumed multiple straw sips thin with coordinated swallow and respiratory pattern. No indication of airway intrusion. She declined graham cracker due to dislike. Discussed her improving taste ability. SLP upgraded texture from soft to regular and no difficulties are predicted. Suspect she may be decannualted soon if continues to tolerate nasal cannula and room air. Pt is independent with swallow abilities and no longer requires skilled ST, therefore will sign off at this time. Notify ST is concerns arise.    HPI HPI: 59yo female admitted 01/18/20 with acute worsening of abdominal pain. 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  Pt with perforation of sigmoid colon requiring surgery.  Required intubation - now with trach  - off vent for a day - and has large bore NG tube in place.  PMSV trials started yesterday and swallow evaluation desired.      SLP Plan  All goals met;Discharge SLP treatment due to (comment)       Recommendations  Diet recommendations: Regular;Thin liquid Liquids provided via: Cup;Straw Medication Administration: Crushed with puree(per pt preference) Supervision: Patient able to self feed Compensations: Slow rate;Small sips/bites Postural Changes and/or Swallow Maneuvers: Seated upright 90 degrees;Upright 30-60 min after meal                Oral Care Recommendations: Oral care BID Follow up Recommendations: None SLP Visit Diagnosis: Dysphagia, unspecified (R13.10) Plan: All goals  met;Discharge SLP treatment due to (comment)       GO                Houston Siren 02/15/2020, 8:58 AM

## 2020-02-15 NOTE — Progress Notes (Signed)
PT Cancellation Note  Patient Details Name: Darlene Castillo MRN: [id number] DOB: June 16, 1961   Cancelled Treatment:    Reason Eval/Treat Not Completed: Patient declined, no reason specified Pt declined today stating she has worked with OT earlier and just wanted to rest right now.   Kaien Pezzullo,KATHrine E 02/15/2020, 12:33 PM Thomasene Mohair PT, DPT Acute Rehabilitation Services Office: [phone]

## 2020-02-15 NOTE — Consult Note (Signed)
WOC Nurse ostomy consult note Pouch change performed with mother at bedside.  She is a retired [employer] fundraiser and comfortable with ostomy care.  She observes pouch change today.  Stoma type/location: oval shaped  2.5 cm  X 2 cm well budded pink stoma  Stomal assessment/size: intact  Midline incision present Peristomal assessment: see above Treatment options for stomal/peristomal skin: barrier ring and 2 piece pouch Output soft brown stool Ostomy pouching: 2pc.  Pouch LAWSON # 2 and LAWSON # [id number] with barrier ring # [id number] supplies ordered for discharge with unit clerk Education provided:pouch change performed  Will recommend filtered pouch due to flatulence.  Pattern for oval stoma given to mother who understands how to measure and cut barrier as well as rationale for barrier ring.  Enrolled patient in [facility] DC program: Yes Anticipate discharge Friday.   Maple Hudson MSN, RN, FNP-BC CWON Wound, Ostomy, Continence Nurse Pager [phone]

## 2020-02-15 NOTE — Progress Notes (Signed)
NAME:  Darlene Castillo, MRN:  [id number], DOB:  1961-02-09, LOS: 70 ADMISSION DATE:  01/18/2020, CONSULTATION DATE:  01/18/20 REFERRING MD:  Erroll Luna CHIEF COMPLAINT:  VDRF, septic shock  Brief History   59  yobf active smoker admitted 5/5 with abdominal pain, constipation and one episode of vomiting. Found to have a perforated sigmoid colon with peritonitis.  To OR for exploratory laparotomy with partial colectomy and end colostomy (negative pathology), returned to ICU post-operatively on mechanical ventilation. Prolonged respiratory failure s/p tracheostomy 5/19.  Difficult to control agitation / pain while in ICU.  Weaned from vent 5/26.    Past Medical History  Chronic opiate use Tobacco abuse DM HTN  Significant Hospital Events   5/05 Admit, ex lap with partial colectomy and end colostomy for perf:  neg path 5/11 Extubated 5/12 Failed extubation.  Initially DNI but reversed.   5/13  Back on vent. Fever curve better. Concern for A Fib but EKG sinus tach with PAC. Down to 40% fio2 . TF on hold but getting meds via NG. On TPN. On dilaudid gtt, scheduled methadone, versed gtt, clonidine, seroquel and nicotine patch. Started on IV heparin for RUE DVT  5/14 dc versed gtt. Working on weaning sedation. Changing abx to rocephin 5/15 Methadone held at family request  5/16 Restarted methadone due to significant agitation 5/18 Agitation overnight, sedation gtt's increased 5/19 Trach.  Dilaudid stopped. Intra stomal enema  5/20 Sedation reduction  5/21 On PSV wean 10/5.  TF on hold with emesis overnight.  5/22 PSV wean, diuresis with 1.5L UOP.  Nausea.  5/23 PRVC 1 unit for Hgb 6.9 5/24 Up in chair, weaned on PSV  5/25 PSV wean ~ 4 hours, then ATC ~ 8 hours 5/26 PSV to open ended ATC  5/28 Remains on ATC 5/30 transferred to floor/ PMV training ongoing and advanced to soft mech diet  6/1: Downsized to size 4 tracheostomy capping trial initiated 6/2: Tracheostomy removed Consults:   CCS PCCM  Procedures:  R IJ CVL 5/5 >> 5/6 ALine 5/5 >> 5/7  ETT 5/5 >> 5/11, replaced 5/12 >> 5/19  Trach 5/19 >> LUE PICC 5/6 >>  Significant Diagnostic Tests:   CT abdomen pelvis with contrast 01/17/2020 >> scattered groundglass opacities in the lower lungs bilaterally, no focal opacities.  Abdominal free air.  Multiple dilated loops of bowel with stool.  Large R renal cyst.  Path 5/5 >> Perforation with acute inflammation and acute serositis. No evidence of malignancy.    [place] UE 5/13 >> RUE DVT  CT ABD / Pelvis w Contrast 5/17 >> increasingly organized apearance of LLQ & pelvic fluid with slight decrease in the deep pelvic fluid.  Slight increase in LLQ fluid. Interloop fluid in the left hemiabdomen is slightly increased overall.  Diffuse interloop fluid and mesenteric stranding is slightly diminished. Persistent small areas of loculated perihepatic fluid unchanged, largest area in the axial plane measureing 2.5 x 1.7 cm. Decreased formed stool in the colon but with slight increase in colonic distention particularly of the transverse colon.  May reflect colonic ileus.  Persistent bibasilar consolidation and pleural effusions without significant change.   CT Sinus 5/20 >> sinus mucosal edema, bilateral mastoid effusions, no air fluid levels, no abscess in neck  MBS 5/28 >>  SLP Diet Recommendations: Thin liquid(clears - will follow up for advancement re: solids   Micro Data:  Covid 5/5 >> negative Flu PCR 5/5 >> negative MRSA PCR 5/12 >> neg Tracheal aspirate 5/12 >>  klebsiella > R ampicillin, Unasyn, I: zosyn Urine analysis 5/12 >> negative  Blood culture 5/12 >> negative  UC 5/19 >> negative  BCx2 5/18 >> negative  Tracheal Aspirate 5/20 >> pseudomonas aeruginosa >> I-ceftazidime, cefepime.  S-imipenem, cipro Tracheal Aspirate 5/26 >> gm stain pmns rare years > culture = few pseudomonas sensitive to cipro BC x 1 5/26  >> neg Urine 5/27 >> no significant  growth  Antimicrobials:  Zosyn 5/5 >> 5/14 Eraxis 5/12 >> 5/14 Rocephin 5/14 >> Eraxis 5/18 >> 5/20 Zosyn 5/19 >> 5/19 Cefepime 5/20 >> 5/22 Meropenem 5/22 >> 5/28     Interim history/subjective:  Doing fantastic.  I have removed her tracheostomy.  Objective   Blood pressure 138/75, pulse 72, temperature 98.4 F (36.9 C), resp. rate 18, height 5\' 1"  (1.549 m), weight 62.6 kg, last menstrual period 05/23/2013, SpO2 94 %.    FiO2 (%):  [28 %] 28 %   Intake/Output Summary (Last 24 hours) at 02/15/2020 1048 Last data filed at 02/14/2020 2143 Gross per 24 hour  Intake 20 ml  Output no documentation  Net 20 ml   Filed Weights   02/11/20 0500 02/14/20 0500 02/15/20 0442  Weight: 60.4 kg 62.7 kg 62.6 kg   Exam: General 59 year old female sitting up in bed no acute distress female sitting up in bed no acute distress HEENT normocephalic atraumatic no jugular venous distention I have now removed tracheostomy, no stridor noted with unsupported airway.  Excellent phonation with tracheostomy stoma covered Pulmonary clear to auscultation Cardiac regular rate and rhythm Abdomen soft nontender Extremities are warm and dry with brisk capillary refill Neuro intact  Resolved problems  Circulatory/septic shock Mild Elevation of LFT's  Acute Metabolic Encephalopathy, started 5/12- resolved 5//24 Acute hypoxic respiratory failure requiring mechanical ventilation, complicated by Klebsiella PNA.  Peritonitis 2/2 Sigmoid Colon Perforation from Chronic Constipation s/p ex lap with sigmoid resection and colostomy. Assessment & Plan:  LOS 28 days    Tracheostomy dependence s/p prolonged resp failure  Chronic Pain, on oral methadone PTA Right Brachial DVT Moderate Malnutrition Intermittent Fluid and Electrolyte imbalance - hypokalemia  Hyperglycemia Anemia    Discussion  She is doing fantastic from a tracheostomy standpoint.  Tolerating capping trials without trouble.  She did require supplemental oxygen last night, however pulse  oximetry fine today.  Her cough mechanics are strong, her phonation is fine.  I have now decannulated her.  Her phonation quality and cough quality is slightly reduced, this will improve over the course of the day  Plan Continue pulse oximetry Observe for need for nocturnal oxygen Keep current tracheostomy dressing in place for 48 hours then can change daily with regular Band-Aid Okay to shower as long as stoma is completely covered Expect stoma to be completely closed over the next 2 weeks, if this does not completely closed over 2-week.  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

## 2020-02-15 NOTE — Progress Notes (Signed)
[facility] [place] [facility] Progress Note  28 Days Post-Op  Subjective: Patient complains of some low back pain from bed. Tolerating diet. Tolerated trach being downsized well. She is in good spirits and hopeful to get home and see her grandchildren soon.   Objective: Vital signs in last 24 hours: Temp:  [97.6 F (36.4 C)-98.4 F (36.9 C)] 98.4 F (36.9 C) (06/02 0440) Pulse Rate:  [57-80] 72 (06/02 0440) Resp:  [14-18] 18 (06/02 0440) BP: (138-158)/(75-84) 138/75 (06/02 0440) SpO2:  [85 %-100 %] 94 % (06/02 0825) FiO2 (%):  [28 %] 28 % (06/01 1553) Weight:  [62.6 kg] 62.6 kg (06/02 0442) Last BM Date: 02/13/20  Intake/Output from previous day: 06/01 0701 - 06/02 0700 In: 20 [I.V.:20] Out: -  Intake/Output this shift: No intake/output data recorded.  PE: Abd: soft, midline wound is clean and packed. +BS,Colostomy working well.   Lab Results:  No results for input(s): WBC, HGB, HCT, PLT in the last 72 hours. BMET No results for input(s): NA, K, CL, CO2, GLUCOSE, BUN, CREATININE, CALCIUM in the last 72 hours. PT/INR No results for input(s): LABPROT, INR in the last 72 hours. CMP     Component Value Date/Time   NA 135 02/11/2020 0500   K 3.2 (L) 02/11/2020 0500   CL 90 (L) 02/11/2020 0500   CO2 34 (H) 02/11/2020 0500   GLUCOSE 109 (H) 02/11/2020 0500   BUN 12 02/11/2020 0500   CREATININE 0.42 (L) 02/11/2020 0500   CALCIUM 9.0 02/11/2020 0500   PROT 7.4 02/09/2020 0640   ALBUMIN 2.3 (L) 02/09/2020 0640   AST 25 02/09/2020 0640   ALT 27 02/09/2020 0640   ALKPHOS 127 (H) 02/09/2020 0640   BILITOT 0.3 02/09/2020 0640   GFRNONAA >60 02/11/2020 0500   GFRAA >60 02/11/2020 0500   Lipase     Component Value Date/Time   LIPASE 19 01/18/2020 0525       Studies/Results: DG Chest Port 1 View  Result Date: 02/14/2020 CLINICAL DATA:  Respiratory failure EXAM: PORTABLE CHEST 1 VIEW COMPARISON:  Feb 10, 2019 FINDINGS: The heart size and mediastinal contours are  unchanged with mild cardiomegaly. Tracheostomy tube seen at the level of the clavicular heads. There is prominence of the central pulmonary vasculature which is improved from the prior exam. Small bilateral effusions are again noted. IMPRESSION: Slight interval improvement in the pulmonary edema and small bilateral effusions. Electronically Signed   By: Prudencio Pair M.D.   On: 02/14/2020 05:06    Anti-infectives: Anti-infectives (From admission, onward)   Start     Dose/Rate Route Frequency Ordered Stop   02/04/20 1400  meropenem (MERREM) 1 g in sodium chloride 0.9 % 100 mL IVPB     1 g 200 mL/hr over 30 Minutes Intravenous Every 8 hours 02/04/20 1245 02/10/20 2300   02/02/20 0800  ceFEPIme (MAXIPIME) 2 g in sodium chloride 0.9 % 100 mL IVPB  Status:  Discontinued     2 g 200 mL/hr over 30 Minutes Intravenous Every 8 hours 02/02/20 0727 02/04/20 1245   02/01/20 1200  anidulafungin (ERAXIS) 100 mg in sodium chloride 0.9 % 100 mL IVPB  Status:  Discontinued     100 mg 78 mL/hr over 100 Minutes Intravenous Every 24 hours 01/31/20 1034 02/02/20 0942   02/01/20 0800  piperacillin-tazobactam (ZOSYN) IVPB 3.375 g  Status:  Discontinued     3.375 g 12.5 mL/hr over 240 Minutes Intravenous Every 8 hours 02/01/20 0735 02/02/20 0721   01/31/20 1200  anidulafungin (ERAXIS) 200 mg in sodium chloride 0.9 % 200 mL IVPB     200 mg 78 mL/hr over 200 Minutes Intravenous  Once 01/31/20 1034 01/31/20 1730   01/27/20 1200  cefTRIAXone (ROCEPHIN) 2 g in sodium chloride 0.9 % 100 mL IVPB  Status:  Discontinued     2 g 200 mL/hr over 30 Minutes Intravenous Every 24 hours 01/27/20 1041 02/01/20 0735   01/26/20 1000  anidulafungin (ERAXIS) 100 mg in sodium chloride 0.9 % 100 mL IVPB  Status:  Discontinued     100 mg 78 mL/hr over 100 Minutes Intravenous Every 24 hours 01/25/20 0956 01/27/20 1041   01/25/20 1100  anidulafungin (ERAXIS) 200 mg in sodium chloride 0.9 % 200 mL IVPB     200 mg 78 mL/hr over 200 Minutes  Intravenous  Once 01/25/20 1006 01/25/20 1525   01/18/20 1400  piperacillin-tazobactam (ZOSYN) IVPB 3.375 g  Status:  Discontinued     3.375 g 12.5 mL/hr over 240 Minutes Intravenous Every 8 hours 01/18/20 1311 01/27/20 1041   01/18/20 0715  piperacillin-tazobactam (ZOSYN) IVPB 3.375 g     3.375 g 100 mL/hr over 30 Minutes Intravenous  Once 01/18/20 0707 01/18/20 0837       Assessment/Plan HTN Prediabetic- A1c6.3,SSI Tobacco abuse Prior h/o heroin abuse at least 15 years ago, on methadone Chronic constipation- miralaxdaily, Movantik started as well Malnutrition- prealbumin14.5 (5/24) - now on soft diet VDRF, VAP- extubated 5/11, reintubated 5/12.Trach5/19, possible decannulation later this week. Completed merrem for VAP. Anemia- tx 1 unit of pRBCs 5/18, 5/23. stable R brachial DVT-eliquis  Septic shock Sigmoid colon perforation secondary to chronic constipation and ulceration with feculent peritonitis and gross contamination of abdominal cavity with stool balls -S/pExploratory laparotomy with partial colectomy and end colostomy5/5 Dr. Luisa Hart - POD#28 -soft diet - continue daily WD dressing changes - therapies   ID -Merrem - PNA completed FEN -soft diet VTE -SCDs,eliquis Foley -d/c 5/13 Dispo -patient has opted to go home with her mother with home therapies. Trach downsized yesterday, possible decannulation later this week. Will work on getting home needs set up. Anticipate patient being ready for discharge towards the end of this week.   LOS: 28 days    Juliet Rude , [facility] 02/15/2020, 9:19 AM Please see Amion for pager number during day hours 7:00am-4:30pm

## 2020-02-15 NOTE — TOC Progression Note (Addendum)
Transition of Care [facility]) - Progression Note    Patient Details  Name: Darlene Castillo MRN: [id number] Date of Birth: [birth year]  Transition of [facility] CM/SW Contact  Ida Rogue, [place] Phone Number: 02/15/2020, 2:59 PM  Clinical Narrative:   Pharmacy spoke with me about patient need to go home on Movantik.  Printed off resources from website and gave to patient.  Encouraged her to call to make sure she was eligible. TOC will continue to follow during the course of hospitalization.  Have been searching for home health provider.  This is who has been contacted. [facility] - No - "staffing is tight" [facility] - NO with trach - IF the trach is removed they could be approached again [facility] - NO - but if there is a "good" payor referral and could pair they will reconsider [facility] -NO - this is an honest NO due to staffing [facility] -NO - OON [facility] - NO - OON [facility] - NO - staffing tight on RN coverage until start of next week [facility] - NO unless we have a good payor to pair  Current plan is try to pair a MCR with this in order to secure services.      Expected Discharge Plan: Skilled Nursing Facility Barriers to Discharge: Continued Medical Work up  Expected Discharge Plan and Services Expected Discharge Plan: Skilled Nursing Facility       Living arrangements for the past 2 months: Single Family Home                                       Social Determinants of Health (SDOH) Interventions    Readmission Risk Interventions No flowsheet data found.

## 2020-02-15 NOTE — Progress Notes (Signed)
Occupational Therapy Treatment Patient Details Name: Darlene Castillo MRN: [id number] DOB: [birth year] Today's Date: 02/15/2020    History of present illness 59 yo female admitted 01/18/20 with the diagnosis of pneumoperitoneum, peritonitis,  perforated sigmoid colon status post ex lap with colectomy and ostomy remains on the ventilator/trach.  Unable to wean successfully d/t ARF with hypoxemia in setting of major abdominal surgery and acute pulmonary edema.PMH: tobacco use, HTN   OT comments  Treatment focused on improving overall strength and mobility needed for functional mobility and in preparation for ADLs.    Follow Up Recommendations  Home health OT    Equipment Recommendations       Recommendations for Other Services      Precautions / Restrictions Precautions Precautions: Fall Restrictions Weight Bearing Restrictions: No       Mobility Bed Mobility   Bed Mobility: Supine to Sit;Sit to Supine Rolling: Supervision   Supine to sit: Supervision        Transfers                 General transfer comment: Patinet reports her goal is to get stronger and "move and walk better." Patient supervision for bed transfers. To work on improving sit to stand - patient performed twice reducing use of upper extremities to strength legs. Patient performed marching at side of bed x 2 to work on improving standing tolerance and overall strength needed for ADLs and functional mobility.    Balance                                           ADL either performed or assessed with clinical judgement   ADL                                               Vision       Perception     Praxis      Cognition                                                Exercises     Shoulder Instructions       General Comments      Pertinent Vitals/ Pain       Pain Assessment: Faces Faces Pain Scale: Hurts a little bit Pain  Location: low back Pain Descriptors / Indicators: Grimacing  Home Living                                          Prior Functioning/Environment              Frequency  Min 2X/week        Progress Toward Goals  OT Goals(current goals can now be found in the care plan section)  Progress towards OT goals: Progressing toward goals;Goals met and updated - see care plan  Acute Rehab OT Goals Patient Stated Goal: to improve strength and walking OT Goal Formulation: With patient Time For Goal Achievement: 02/29/20 Potential to Achieve Goals: Good ADL Goals Pt Will  Transfer to Toilet: ambulating;regular height toilet;with supervision Pt Will Perform Toileting - Clothing Manipulation and hygiene: with supervision;sit to/from stand Additional ADL Goal #2: Patient will stand at sink and perform grooming task  Plan Other (comment)(Patient is planning to return home with assistance of her mother with [facility] services.)    Co-evaluation          OT goals addressed during session: Strengthening/ROM      AM-PAC OT "6 Clicks" Daily Activity     Outcome Measure                    End of Session    OT Visit Diagnosis: Other abnormalities of gait and mobility (R26.89);Muscle weakness (generalized) (M62.81);Pain   Activity Tolerance Patient limited by fatigue   Patient Left in bed;with call bell/phone within reach   Nurse Communication          Time: 5927-6394 OT Time Calculation (min): 16 min  Charges: OT General Charges $OT Visit: 1 Visit OT Treatments $Therapeutic Activity: 8-22 mins  Darlene Castillo, OTR/L [name]  Office [phone]    Darlene Castillo 02/15/2020, 3:38 PM

## 2020-02-16 MED ORDER — APIXABAN 5 MG PO TABS: 5.0000 mg | ORAL_TABLET | Freq: Two times a day (BID) | ORAL | 0 refills | Status: AC

## 2020-02-16 MED ORDER — METOPROLOL TARTRATE 25 MG PO TABS: 100.0000 mg | ORAL_TABLET | Freq: Two times a day (BID) | ORAL | 0 refills | Status: AC

## 2020-02-16 MED ORDER — POLYETHYLENE GLYCOL 3350 17 G PO PACK: 17.0000 g | PACK | Freq: Every day | ORAL | 0 refills | Status: AC

## 2020-02-16 MED ORDER — MELATONIN 3 MG PO TABS: 3.0000 mg | ORAL_TABLET | Freq: Every day | ORAL | 0 refills | Status: AC

## 2020-02-16 MED ORDER — HYDROCODONE-ACETAMINOPHEN 7.5-325 MG/15ML PO SOLN: 10.0000 mL | ORAL | 0 refills | Status: AC | PRN

## 2020-02-16 MED ORDER — CLONIDINE HCL 0.2 MG PO TABS: 0.2000 mg | ORAL_TABLET | Freq: Three times a day (TID) | ORAL | 0 refills | Status: AC

## 2020-02-16 MED ORDER — SYMPROIC 0.2 MG PO TABS: 0.2000 mg | ORAL_TABLET | Freq: Every day | ORAL | 1 refills | Status: AC

## 2020-02-16 NOTE — Plan of Care (Signed)

## 2020-02-16 NOTE — Progress Notes (Signed)
NAME:  Darlene Castillo, MRN:  [id number], DOB:  Sep 03, 1961, LOS: 29 ADMISSION DATE:  01/18/2020, CONSULTATION DATE:  01/18/20 REFERRING MD:  Harriette Bouillon CHIEF COMPLAINT:  VDRF, septic shock  Brief History   59  yobf active smoker admitted 5/5 with abdominal pain, constipation and one episode of vomiting. Found to have a perforated sigmoid colon with peritonitis.  To OR for exploratory laparotomy with partial colectomy and end colostomy (negative pathology), returned to ICU post-operatively on mechanical ventilation. Prolonged respiratory failure s/p tracheostomy 5/19.  Difficult to control agitation / pain while in ICU.  Weaned from vent 5/26.    Past Medical History  Chronic opiate use Tobacco abuse DM HTN  Significant Hospital Events   5/05 Admit, ex lap with partial colectomy and end colostomy for perf:  neg path 5/11 Extubated 5/12 Failed extubation.  Initially DNI but reversed.   5/13  Back on vent. Fever curve better. Concern for A Fib but EKG sinus tach with PAC. Down to 40% fio2 . TF on hold but getting meds via NG. On TPN. On dilaudid gtt, scheduled methadone, versed gtt, clonidine, seroquel and nicotine patch. Started on IV heparin for RUE DVT  5/14 dc versed gtt. Working on weaning sedation. Changing abx to rocephin 5/15 Methadone held at family request  5/16 Restarted methadone due to significant agitation 5/18 Agitation overnight, sedation gtt's increased 5/19 Trach.  Dilaudid stopped. Intra stomal enema  5/20 Sedation reduction  5/21 On PSV wean 10/5.  TF on hold with emesis overnight.  5/22 PSV wean, diuresis with 1.5L UOP.  Nausea.  5/23 PRVC 1 unit for Hgb 6.9 5/24 Up in chair, weaned on PSV  5/25 PSV wean ~ 4 hours, then ATC ~ 8 hours 5/26 PSV to open ended ATC  5/28 Remains on ATC 5/30 transferred to floor/ PMV training ongoing and advanced to soft mech diet  6/1: Downsized to size 4 tracheostomy capping trial initiated 6/2: Tracheostomy removed Consults:   CCS PCCM  Procedures:  R IJ CVL 5/5 >> 5/6 ALine 5/5 >> 5/7  ETT 5/5 >> 5/11, replaced 5/12 >> 5/19  Trach 5/19 >> LUE PICC 5/6 >>  Significant Diagnostic Tests:   CT abdomen pelvis with contrast 01/17/2020 >> scattered groundglass opacities in the lower lungs bilaterally, no focal opacities.  Abdominal free air.  Multiple dilated loops of bowel with stool.  Large R renal cyst.  Path 5/5 >> Perforation with acute inflammation and acute serositis. No evidence of malignancy.    [place] UE 5/13 >> RUE DVT  CT ABD / Pelvis w Contrast 5/17 >> increasingly organized apearance of LLQ & pelvic fluid with slight decrease in the deep pelvic fluid.  Slight increase in LLQ fluid. Interloop fluid in the left hemiabdomen is slightly increased overall.  Diffuse interloop fluid and mesenteric stranding is slightly diminished. Persistent small areas of loculated perihepatic fluid unchanged, largest area in the axial plane measureing 2.5 x 1.7 cm. Decreased formed stool in the colon but with slight increase in colonic distention particularly of the transverse colon.  May reflect colonic ileus.  Persistent bibasilar consolidation and pleural effusions without significant change.   CT Sinus 5/20 >> sinus mucosal edema, bilateral mastoid effusions, no air fluid levels, no abscess in neck  MBS 5/28 >>  SLP Diet Recommendations: Thin liquid(clears - will follow up for advancement re: solids   Micro Data:  Covid 5/5 >> negative Flu PCR 5/5 >> negative MRSA PCR 5/12 >> neg Tracheal aspirate 5/12 >>  klebsiella > R ampicillin, Unasyn, I: zosyn Urine analysis 5/12 >> negative  Blood culture 5/12 >> negative  UC 5/19 >> negative  BCx2 5/18 >> negative  Tracheal Aspirate 5/20 >> pseudomonas aeruginosa >> I-ceftazidime, cefepime.  S-imipenem, cipro Tracheal Aspirate 5/26 >> gm stain pmns rare years > culture = few pseudomonas sensitive to cipro BC x 1 5/26  >> neg Urine 5/27 >> no significant  growth  Antimicrobials:  Zosyn 5/5 >> 5/14 Eraxis 5/12 >> 5/14 Rocephin 5/14 >> Eraxis 5/18 >> 5/20 Zosyn 5/19 >> 5/19 Cefepime 5/20 >> 5/22 Meropenem 5/22 >> 5/28     Interim history/subjective:  No distress  Objective   Blood pressure (Abnormal) 141/83, pulse 64, temperature 98.8 F (37.1 C), temperature source Oral, resp. rate 17, height 5\' 1"  (1.549 m), weight 60.5 kg, last menstrual period 05/23/2013, SpO2 90 %.        Intake/Output Summary (Last 24 hours) at 02/16/2020 1045 Last data filed at 02/16/2020 0854 Gross per 24 hour  Intake 571 ml  Output 1100 ml  Net -529 ml   Filed Weights   02/14/20 0500 02/15/20 0442 02/16/20 0900  Weight: 62.7 kg 62.6 kg 60.5 kg   Exam: General 59 year old black female sitting up in bed currently talking on phone her phonation quality is remarkably improved when comparing immediate post trach removal HEENT normocephalic atraumatic tracheostomy dressing was removed, stoma completely closed.  New occlusive dressing replaced, phonation quality excellent as mentioned above neck no stridor noted over anterior neck Pulmonary: Clear to auscultation currently on room air Cardiac: Regular rate and rhythm Abdomen: Soft nontender Extremities: Warm dry brisk cap refill Neuro awake oriented no focal deficits.  Resolved problems  Circulatory/septic shock Mild Elevation of LFT's  Acute Metabolic Encephalopathy, started 5/12- resolved 5//24 Acute hypoxic respiratory failure requiring mechanical ventilation, complicated by Klebsiella PNA.  Peritonitis 2/2 Sigmoid Colon Perforation from Chronic Constipation s/p ex lap with sigmoid resection and colostomy. Assessment & Plan:  LOS 29 days    Tracheostomy dependence s/p prolonged resp failure  Chronic Pain, on oral methadone PTA Right Brachial DVT Moderate Malnutrition Intermittent Fluid and Electrolyte imbalance - hypokalemia  Hyperglycemia Anemia    Discussion decannulated yesterday she is  doing excellent.  It appears as though the stoma is completely closed.  Plan Continue to keep dressing in place until stoma healed.  Can just use a regular Band-Aid daily Okay to shower as long as stoma site is covered Should check walking oximetry prior to discharge No specific pulmonary follow-up required in regards to trach however should she have trouble can call respiratory therapy office At [phone].  If needed I can see her in tracheostomy clinic, however the only potential complication I would be concerned about would be nonhealing tracheostomy stoma/tracheocutaneous fistula.  If this were to be the case she would need ENT evaluation.  I doubt she will require this based on how quickly she is healing  Best practice:  Per primary   Erick Colace ACNP-BC [place] Pager # [phone] OR # [phone] if no answer

## 2020-02-16 NOTE — Progress Notes (Signed)
VAST RN returned to pt's bedside to discontinue PICC as ordered for discharge. Explained procedure to patient and removed  LA TL PICC per protocol. Manual pressure applied for 5 mins. No bleeding or swelling noted. Instructed patient to remain in bed until 1545. Educated patient about S/S of infection and when to call MD; no heavy lifting or pressure on affected side for 24 hours; keep dressing dry and intact until 1545 tomorrow. Pt verbalized comprehension.

## 2020-02-16 NOTE — Progress Notes (Signed)
[facility] Progress Note  29 Days Post-Op  Subjective: Patient doing well, working on disposition planning. Tolerating diet and having bowel function. Tolerating removal of tracheostomy.   Objective: Vital signs in last 24 hours: Temp:  [98.8 F (37.1 C)-100.2 F (37.9 C)] 98.8 F (37.1 C) (06/03 0522) Pulse Rate:  [60-72] 64 (06/03 0522) Resp:  [15-20] 17 (06/03 0522) BP: (141-175)/(77-86) 141/83 (06/03 0522) SpO2:  [90 %-94 %] 90 % (06/03 0836) Weight:  [60.5 kg] 60.5 kg (06/03 0900) Last BM Date: 02/15/20  Intake/Output from previous day: 06/02 0701 - 06/03 0700 In: 571 [P.O.:255; I.V.:316] Out: -  Intake/Output this shift: Total I/O In: -  Out: 1100 [Urine:1100]  PE: Abd: soft, midline wound is clean and packed. +BS,Colostomy working well.   Lab Results:  No results for input(s): WBC, HGB, HCT, PLT in the last 72 hours. BMET No results for input(s): NA, K, CL, CO2, GLUCOSE, BUN, CREATININE, CALCIUM in the last 72 hours. PT/INR No results for input(s): LABPROT, INR in the last 72 hours. CMP     Component Value Date/Time   NA 135 02/11/2020 0500   K 3.2 (L) 02/11/2020 0500   CL 90 (L) 02/11/2020 0500   CO2 34 (H) 02/11/2020 0500   GLUCOSE 109 (H) 02/11/2020 0500   BUN 12 02/11/2020 0500   CREATININE 0.42 (L) 02/11/2020 0500   CALCIUM 9.0 02/11/2020 0500   PROT 7.4 02/09/2020 0640   ALBUMIN 2.3 (L) 02/09/2020 0640   AST 25 02/09/2020 0640   ALT 27 02/09/2020 0640   ALKPHOS 127 (H) 02/09/2020 0640   BILITOT 0.3 02/09/2020 0640   GFRNONAA >60 02/11/2020 0500   GFRAA >60 02/11/2020 0500   Lipase     Component Value Date/Time   LIPASE 19 01/18/2020 0525       Studies/Results: No results found.  Anti-infectives: Anti-infectives (From admission, onward)   Start     Dose/Rate Route Frequency Ordered Stop   02/04/20 1400  meropenem (MERREM) 1 g in sodium chloride 0.9 % 100 mL IVPB     1 g 200 mL/hr over 30 Minutes Intravenous Every 8  hours 02/04/20 1245 02/10/20 2300   02/02/20 0800  ceFEPIme (MAXIPIME) 2 g in sodium chloride 0.9 % 100 mL IVPB  Status:  Discontinued     2 g 200 mL/hr over 30 Minutes Intravenous Every 8 hours 02/02/20 0727 02/04/20 1245   02/01/20 1200  anidulafungin (ERAXIS) 100 mg in sodium chloride 0.9 % 100 mL IVPB  Status:  Discontinued     100 mg 78 mL/hr over 100 Minutes Intravenous Every 24 hours 01/31/20 1034 02/02/20 0942   02/01/20 0800  piperacillin-tazobactam (ZOSYN) IVPB 3.375 g  Status:  Discontinued     3.375 g 12.5 mL/hr over 240 Minutes Intravenous Every 8 hours 02/01/20 0735 02/02/20 0721   01/31/20 1200  anidulafungin (ERAXIS) 200 mg in sodium chloride 0.9 % 200 mL IVPB     200 mg 78 mL/hr over 200 Minutes Intravenous  Once 01/31/20 1034 01/31/20 1730   01/27/20 1200  cefTRIAXone (ROCEPHIN) 2 g in sodium chloride 0.9 % 100 mL IVPB  Status:  Discontinued     2 g 200 mL/hr over 30 Minutes Intravenous Every 24 hours 01/27/20 1041 02/01/20 0735   01/26/20 1000  anidulafungin (ERAXIS) 100 mg in sodium chloride 0.9 % 100 mL IVPB  Status:  Discontinued     100 mg 78 mL/hr over 100 Minutes Intravenous Every 24 hours 01/25/20 0956 01/27/20 1041  01/25/20 1100  anidulafungin (ERAXIS) 200 mg in sodium chloride 0.9 % 200 mL IVPB     200 mg 78 mL/hr over 200 Minutes Intravenous  Once 01/25/20 1006 01/25/20 1525   01/18/20 1400  piperacillin-tazobactam (ZOSYN) IVPB 3.375 g  Status:  Discontinued     3.375 g 12.5 mL/hr over 240 Minutes Intravenous Every 8 hours 01/18/20 1311 01/27/20 1041   01/18/20 0715  piperacillin-tazobactam (ZOSYN) IVPB 3.375 g     3.375 g 100 mL/hr over 30 Minutes Intravenous  Once 01/18/20 0707 01/18/20 0837       Assessment/Plan HTN Prediabetic- A1c6.3,SSI Tobacco abuse 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 Completed merrem for VAP. Anemia- tx 1 unit of pRBCs 5/18, 5/23. stable R brachial DVT-eliquis  Septic shock Sigmoid colon perforation secondary to chronic constipation and ulceration with feculent peritonitis and gross contamination of abdominal cavity with stool balls -S/pExploratory laparotomy with partial colectomy and end colostomy5/5 Dr. Luisa Hart - POD#29 -soft diet - continuedailyWD dressing changes - therapies   ID -Merrem - PNAcompleted FEN -soft diet VTE -SCDs,eliquis Foley -d/c 5/13 Dispo -patienthas opted to go home with her mother with home therapies. Decannulated yesterday and tolerating well. I have discussed pain control with her Councilor [employer]) at [facility]. Plan for discharge early tomorrow AM.   LOS: 29 days    Juliet Rude , [facility] Surgery 02/16/2020, 10:01 AM Please see Amion for pager number during day hours 7:00am-4:30pm

## 2020-02-16 NOTE — Progress Notes (Signed)
Occupational Therapy Treatment Patient Details Name: Darlene Castillo MRN: [id number] DOB: [birth year] Today's Date: 02/16/2020    History of present illness 59 yo female admitted 01/18/20 with the diagnosis of pneumoperitoneum, peritonitis,  perforated sigmoid colon status post ex lap with colectomy and ostomy remains on the ventilator/trach.  Unable to wean successfully d/t ARF with hypoxemia in setting of major abdominal surgery and acute pulmonary edema.PMH: tobacco use, HTN   OT comments  Educate patient on HEP for B UE strengthening using theraband, provide visual demo and handout to bring home. Discuss any equipment needs for home, patient states she has shower chair and does not need any other DME. Patient looking forward to going home tomorrow morning.    Follow Up Recommendations  Home health OT    Equipment Recommendations  None recommended by OT(patient reports already received shower chair)       Precautions / Restrictions Precautions Precautions: Fall Precaution Comments: pt on RA (decannulated 02/14/20) Restrictions Weight Bearing Restrictions: No                               Cognition Arousal/Alertness: Awake/alert Behavior During Therapy: WFL for tasks assessed/performed Overall Cognitive Status: Within Functional Limits for tasks assessed                                          Exercises Exercises: General Upper Extremity General Exercises - Upper Extremity Shoulder Flexion: Strengthening;Theraband Theraband Level (Shoulder Flexion): Level 1 (Yellow) Shoulder Extension: Strengthening;Theraband Theraband Level (Shoulder Extension): Level 1 (Yellow) Shoulder ABduction: Strengthening;Theraband Theraband Level (Shoulder Abduction): Level 1 (Yellow) Shoulder Horizontal ABduction: Strengthening;Theraband Theraband Level (Shoulder Horizontal Abduction): Level 1 (Yellow) Shoulder Horizontal ADduction: Strengthening;Theraband Theraband Level  (Shoulder Horizontal Adduction): Level 1 (Yellow) Elbow Flexion: Strengthening;Theraband Theraband Level (Elbow Flexion): Level 1 (Yellow) Elbow Extension: Strengthening;Theraband Theraband Level (Elbow Extension): Level 1 (Yellow)      General Comments educated patient in BUE strengthening program with theraband and provided handout.          Frequency  Min 2X/week        Progress Toward Goals  OT Goals(current goals can now be found in the care plan section)  Progress towards OT goals: Progressing toward goals  Acute Rehab OT Goals Patient Stated Goal: to improve strength and walking OT Goal Formulation: With patient Time For Goal Achievement: 02/29/20 Potential to Achieve Goals: Good ADL Goals Pt Will Perform Grooming: with supervision Pt Will Perform Upper Body Bathing: with supervision Pt Will Transfer to Toilet: with supervision Pt Will Perform Toileting - Clothing Manipulation and hygiene: with supervision Pt/caregiver will Perform Home Exercise Program: Increased strength;Both right and left upper extremity;With theraband;With Supervision;With written HEP provided Additional ADL Goal #2: Patient will stand at sink and perform grooming task  Plan Discharge plan remains appropriate       AM-PAC OT "6 Clicks" Daily Activity     Outcome Measure   Help from another person eating meals?: None Help from another person taking care of personal grooming?: A Little Help from another person toileting, which includes using toliet, bedpan, or urinal?: A Little Help from another person bathing (including washing, rinsing, drying)?: A Lot Help from another person to put on and taking off regular upper body clothing?: A Little Help from another person to put on and taking off regular lower body clothing?:  A Lot 6 Click Score: 17    End of Session  OT Visit Diagnosis: Other abnormalities of gait and mobility (R26.89);Muscle weakness (generalized) (M62.81);Pain Pain - part  of body: (abdomen)   Activity Tolerance Patient tolerated treatment well   Patient Left in chair;with call bell/phone within reach           Time: 1321-1335 OT Time Calculation (min): 14 min  Charges: OT General Charges $OT Visit: 1 Visit OT Treatments $Therapeutic Exercise: 8-22 mins  Delbert Phenix OT Pager: [place] 02/16/2020, 2:45 PM

## 2020-02-16 NOTE — Progress Notes (Signed)
VAST consulted to discontinue PICC. Called unit and spoke with pt's nurse regarding plan of care. Pt will be discharged very early in am and no longer needs access. VAST RN advised PICC will be discontinued sometime this shift.

## 2020-02-16 NOTE — Progress Notes (Signed)
Physical Therapy Treatment Patient Details Name: Darlene Castillo MRN: [id number] DOB: [birth year] Today's Date: 02/16/2020    History of Present Illness 59 yo female admitted 01/18/20 with the diagnosis of pneumoperitoneum, peritonitis,  perforated sigmoid colon status post ex lap with colectomy and ostomy remains on the ventilator/trach.  Unable to wean successfully d/t ARF with hypoxemia in setting of major abdominal surgery and acute pulmonary edema.PMH: tobacco use, HTN    PT Comments    Patient making steady progress with acute PT. She was able to complete bed mobility with min guard and extra time. Patient required min assist/guard for sit<>stand transfer and cues to sequence power up. Pt continues to require external support for standing activities and was able stand for ~13 minutes at sink to perform oral care with 1-2 UE support. Gait training progress with RW today and visual cues utilized to reduce scissoring step pattern with success 75% of gait. Patient is planning to discharge home with assist from her mother and will require HHPT and 24/7 assist.     Follow Up Recommendations  Home health PT;Supervision/Assistance - 24 hour(pt declined CIR/SNF)     Equipment Recommendations  Rolling walker with 5" wheels    Recommendations for Other Services       Precautions / Restrictions Precautions Precautions: Fall Precaution Comments: pt on RA (decannulated 02/14/20) Restrictions Weight Bearing Restrictions: No    Mobility  Bed Mobility Overal bed mobility: Needs Assistance Bed Mobility: Supine to Sit Rolling: Supervision         General bed mobility comments: pt required extra time for mobilizing to EOB, HOB slightly elevated. cues requried to use bil UE to scoot to EOB.  Transfers Overall transfer level: Needs assistance Equipment used: Rolling walker (2 wheeled) Transfers: Sit to/from Stand Sit to Stand: Min assist;Min guard         General transfer comment: cues for  safe technique with hand placement, LE positioning and forward trunk lean to initiate power up. Pt required bil UE use to rise and min assist to steady from EOB. min guard to rise from recliner.  Ambulation/Gait Ambulation/Gait assistance: Min guard;Min assist Gait Distance (Feet): 80 Feet Assistive device: Rolling walker (2 wheeled) Gait Pattern/deviations: Step-through pattern;Decreased stride length;Narrow base of support;Scissoring Gait velocity: decreased   General Gait Details: min assist to manage walker proximity intertmittently. cues for safe step pattern and to widen stance. visual cue with tile floor to facilitate wider steps and decrease scissoring gait.   Stairs        Wheelchair Mobility    Modified Rankin (Stroke Patients Only)       Balance Overall balance assessment: Needs assistance Sitting-balance support: Feet supported Sitting balance-Leahy Scale: Good     Standing balance support: During functional activity;Bilateral upper extremity supported Standing balance-Leahy Scale: Poor Standing balance comment: pt is heavily reliant on external support. Time spent standing at room sink for pt to perform self care for oral care. pt leaning on sink while setting up toothbrush and washign dentures. pt able to stand at recliner and reposition purewick herself, 1 UE support on bed rail during this.                Cognition Arousal/Alertness: Awake/alert Behavior During Therapy: WFL for tasks assessed/performed Overall Cognitive Status: Within Functional Limits for tasks assessed               Exercises      General Comments General comments (skin integrity, edema, etc.): pt ambulated on  RA, destarated to 89% with gait, sats improved to 96% with pursed lip breathing.      Pertinent Vitals/Pain Pain Assessment: 0-10 Faces Pain Scale: Hurts a little bit Pain Location: abdomen Pain Descriptors / Indicators: Discomfort;Sore Pain Intervention(s): Limited  activity within patient's tolerance;Monitored during session;Patient requesting pain meds-RN notified;RN gave pain meds during session           PT Goals (current goals can now be found in the care plan section) Acute Rehab PT Goals Patient Stated Goal: to improve strength and walking PT Goal Formulation: With patient Time For Goal Achievement: 02/23/20(updated) Potential to Achieve Goals: Good Progress towards PT goals: Progressing toward goals    Frequency    Min 3X/week      PT Plan Current plan remains appropriate       AM-PAC PT "6 Clicks" Mobility   Outcome Measure  Help needed turning from your back to your side while in a flat bed without using bedrails?: A Little Help needed moving from lying on your back to sitting on the side of a flat bed without using bedrails?: A Little Help needed moving to and from a bed to a chair (including a wheelchair)?: A Little Help needed standing up from a chair using your arms (e.g., wheelchair or bedside chair)?: A Little Help needed to walk in hospital room?: A Little Help needed climbing 3-5 steps with a railing? : A Lot 6 Click Score: 17    End of Session Equipment Utilized During Treatment: Gait belt Activity Tolerance: Patient tolerated treatment well Patient left: in chair;with call bell/phone within reach;with chair alarm set Nurse Communication: Mobility status PT Visit Diagnosis: Other abnormalities of gait and mobility (R26.89);Muscle weakness (generalized) (M62.81)     Time: 2376-2831 PT Time Calculation (min) (ACUTE ONLY): 43 min  Charges:  $Gait Training: 8-22 mins $Therapeutic Activity: 23-37 mins                     Verner Mould, DPT Physical Therapist with [facility] [phone]  02/16/2020 1:56 PM

## 2020-02-17 NOTE — Progress Notes (Signed)
Discharge instructions given at this time with stated understanding

## 2020-02-17 NOTE — Plan of Care (Signed)

## 2020-02-21 DIAGNOSIS — K631 Perforation of intestine (nontraumatic): Secondary | ICD-10-CM

## 2020-05-01 DIAGNOSIS — I82621 Acute embolism and thrombosis of deep veins of right upper extremity: Secondary | ICD-10-CM | POA: Insufficient documentation

## 2020-06-08 DIAGNOSIS — N898 Other specified noninflammatory disorders of vagina: Secondary | ICD-10-CM | POA: Insufficient documentation

## 2020-06-21 ENCOUNTER — Ambulatory Visit: Payer: Self-pay | Admitting: Surgery

## 2020-06-21 NOTE — H&P (Signed)
Kerstie Agent Zaldivar Appointment: 06/21/2020 11:00 AM Location: [facility] Patient #: [id number] DOB: 1961/05/11 Single / Language: [place] / Race: Black or African American Female  History of Present Illness Ardeth Sportsman MD; 06/21/2020 12:28 PM) The patient is a 59 year old female who presents with a complaint of Desire for colostomy takedown. ` ` ` The patient returns s/p emergent of operative Hartmann resection with colectomy and colostomy 01/18/2020 for stercoral ulcer perforation.      Pleasant woman with history of severe constipation. 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 Unfortunately developed severe constipation and perforation requiring emergent operative resection. She was in severe septic shock due to massive feculent peritonitis with a 1 month hospital stay. Eventually recovered and stabilized. Followed up with operating surgeon, Dr. Luisa Hart in late June recovering. She is hoping to consider colostomy takedown and she comes in today to discuss this. She usually has a bowel movement once a day. Uses occasional MiraLAX. No episodes fair constipation or bleeding. Her pathology was underwhelming on her colon. No diverticulitis or polyp or mass or tumor. She's never had a colonoscopy. No family history of bowel problems that she is aware of. She is on Eliquis. 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 Treatment started in May. She comes in today by herself. She does not smoke. She is not diabetic. She can walk a couple miles without difficulty. No cardiac or pulmonary issues. Follow by Dr. Leavy Cella for her health issues. Does not have a cardiologist nor pulmonologist.   No personal nor family history of GI/colon cancer, inflammatory bowel disease, irritable bowel syndrome, allergy such as Celiac Sprue, dietary/dairy problems, colitis, ulcers nor gastritis. No recent sick  contacts/gastroenteritis. No travel outside the country. No changes in diet. No dysphagia to solids or liquids. No significant heartburn or reflux. No melena, hematemesis, coffee ground emesis. No evidence of prior gastric/peptic ulceration. ` ###########################################  Pathology: SURGICAL PATHOLOGY CASE: [id number] PATIENT: Posey Dada Surgical Pathology Report     Clinical History: Pneumoperitoneum (crm)     FINAL MICROSCOPIC DIAGNOSIS:  A. COLON, PERFORATED SIGMOID, RESECTION: - Perforation with acute inflammation and acute serositis. - No evidence of malignancy.   Siddalee Vanderheiden DESCRIPTION: The specimen is received fresh labeled perforated sigmoid colon, and consists of 2 unoriented portions of bowel. The first segment displays 1 open and 1 stapled margin, and measures 3.5 cm in length. The second segment displays 2 stapled resection margins, measures 7.0 cm in length. The serosal surfaces are tan-red to green, focally dusky, with attached adipose tissue. Identified on the larger segment of bowel is a 2.0 cm full-thickness defect, measuring 1.0 cm from the closest margin. The surrounding tissue is tan-green, dull, and softened. Lumen is filled with a small amount of tan-yellow fecal material. Mucosa on each segment of bowel is tan-pink with normal folding, and no mucosal lesions are grossly identified. Wall ranges from 0.2 to 0.4 cm in thickness. No enlarged lymph nodes are grossly identified. Representative sections are submitted in 9 cassettes. 1 and 2 = stapled margin from the shorter segment of bowel 3 = representative mucosa 4 and 5, 6 and 7 = opposing stapled margins from longer segment of bowel 8 and 9 = sections of perforation Lovey Newcomer 01/19/2020)  Final Diagnosis performed by Jimmy Picket, MD. Electronically signed 01/19/2020 Technical and / or Professional components performed at [facility], [address]  [place], [place] [address]. Immunohistochemistry Technical  component (if applicable) was performed at [facility]. [address], [address], [place], [place] [address]. IMMUNOHISTOCHEMISTRY DISCLAIMER (if applicable): Some of these immunohistochemical stains may have been developed and the performance characteristics determine by [facility]. Some may not have been cleared or approved by the U.S. Food and Drug Administration. The FDA has determined that such clearance or approval is not necessary. This test is used for clinical purposes. It should not be regarded as investigational or for research. This laboratory is certified under the Clinical Laboratory Improvement Amendments of 1988 (CLIA-88) as qualified to perform high complexity clinical laboratory testing. The controls stained appropriately. ` ` ###########################################`   Preoperative diagnosis: Sigmoid colon perforation secondary to chronic constipation and ulceration with feculent peritonitis and Amariana Mirando contamination of abdominal cavity with stool balls septic shock  Postoperative diagnosis: Same with significant fecal contamination class IV wound with free stool balls floating throughout the abdominal cavity causing severe widespread peritonitis with septic shock  Procedure: Exploratory laparotomy with partial colectomy and end colostomy  Surgeon: Harriette Bouillon, MD  Assistant: Casimer Bilis    Drains: None  Specimen: Sigmoid colon with perforation to pathology with multiple stool balls  EBL: 30 cc  IV fluids: Per anesthesia record  Indications for procedure: Patient presents emergently to the operating room after being seen this morning the emergency room with abdominal pain. CT scan showed perforation of her sigmoid colon due to chronic constipation and questionable stercoral ulceration. She had severe peritonitis and was brought emergently to the operative room.  Risk, benefits and the need for colostomy were discussed extensively with the patient as well as significant complication of infection postop, septic shock, death, DVT, bowel loss, multiple operations, and the need further surgery to correct colostomy takedown if stable. Possible mortality of 15% in this circumstance. No medical options available.The procedure has been discussed with the patient. Alternative therapies have been discussed with the patient. Operative risks include bleeding, Infection, Organ injury, Nerve injury, Blood vessel injury, DVT, Pulmonary embolism, Death, And possible reoperation. Medical management risks include worsening of present situation. The success of the procedure is 50 -90 % at treating patients symptoms. The patient understands and agrees to proceed.   Description of procedure: The patient was seen in the holding area. Questions were answered. She was taken back to the operating. After induction of general esthesia she was placed on the operating table supine. The anesthesiologist placed central lines and a lines for monitoring. Her abdomen is then prepped and draped sterile fashion and Foley catheter was placed sterilely. Timeout was done. Incision was done. Dissection was carried to the midline linea alba into the abdominal cavity. Upon entering there was Imagine Nest fecal fluid gushing out of the abdominal cavity. She had severe peritonitis. There is a least a liter of feculent material circulating throughout the abdominal cavity. Retractor was placed. She had a large perforation of her distal sigmoid colon. I was able to fire stapling device proximal to this in the distal to the perforation control. I then used the LigaSure to take the mesentery down to the distal sigmoid colon. 2-0 Prolene was placed in the rectal stump. The specimen was passed off the field. I then irrigated the cavity with 10 L of crystalloid since there was Toy Eisemann fecal  contamination and free-floating stool balls throughout the abdominal cavity. After irrigation around the small bowel looking for any evidence of injury and there is none. The cecum was full stool as well as a sitting, transverse, descending  and remainder of sigmoid colon. There is also significant stool in the rectal vault. There is no signs of ischemia or perforation. Through the left lower quadrant circular incision the colon was brought out as a colostomy. Prior to this I mobilized along the white line of Toldt for better length. We then finished irrigating. All sponges were counted and found to be correct. Retractors removed. The fascia was closed with #1 PDS. The colostomy matured with 3-0 Vicryl. The wound was packed open. Dry dressings applied. All counts were found to be correct. Patient was taken to the ICU in critical but stable condition intubated.    Allergies Maurilio Lovely, CMA; 06/21/2020 10:54 AM) Ibuprofen *ANALGESICS - ANTI-INFLAMMATORY*  Medication History Maurilio Lovely, CMA; 06/21/2020 10:54 AM) cloNIDine HCl (0.2MG  Tablet, Oral) Active. Metoprolol Tartrate (  Tablet, Oral) Active. Methadone HCl (  Tablet, 27 Oral) Active. Eliquis (  Tablet, Oral) Active. Naldemedine Tosylate (0.2MG  Tablet, Oral) Active.    Vitals Maurilio Lovely CMA; 06/21/2020 10:55 AM) 06/21/2020 10:54 AM Weight: 131.4 lb Height: 60in Body Surface Area: 1.56 m Body Mass Index: 25.66 kg/m  Temp.: 97.4F(Tympanic)  Pulse: 68 (Regular)  BP: 122/70(Sitting, Left Arm, Standard)        Physical Exam Ardeth Sportsman MD; 06/21/2020 12:23 PM)  General Mental Status-Alert. General Appearance-Not in acute distress, Not Sickly. Orientation-Oriented X3. Hydration-Well hydrated. Voice-Normal.  Integumentary Global Assessment Upon inspection and palpation of skin surfaces of the - Axillae: non-tender, no inflammation or ulceration, no drainage. and  Distribution of scalp and body hair is normal. General Characteristics Temperature - normal warmth is noted.  Head and Neck Head-normocephalic, atraumatic with no lesions or palpable masses. Face Global Assessment - atraumatic, no absence of expression. Neck Global Assessment - no abnormal movements, no bruit auscultated on the right, no bruit auscultated on the left, no decreased range of motion, non-tender. Trachea-midline. Thyroid Gland Characteristics - non-tender.  Eye Eyeball - Left-Extraocular movements intact, No Nystagmus - Left. Eyeball - Right-Extraocular movements intact, No Nystagmus - Right. Cornea - Left-No Hazy - Left. Cornea - Right-No Hazy - Right. Sclera/Conjunctiva - Left-No scleral icterus, No Discharge - Left. Sclera/Conjunctiva - Right-No scleral icterus, No Discharge - Right. Pupil - Left-Direct reaction to light normal. Pupil - Right-Direct reaction to light normal.  ENMT Ears Pinna - Left - no drainage observed, no generalized tenderness observed. Pinna - Right - no drainage observed, no generalized tenderness observed. Nose and Sinuses External Inspection of the Nose - no destructive lesion observed. Inspection of the nares - Left - quiet respiration. Inspection of the nares - Right - quiet respiration. Mouth and Throat Lips - Upper Lip - no fissures observed, no pallor noted. Lower Lip - no fissures observed, no pallor noted. Nasopharynx - no discharge present. Oral Cavity/Oropharynx - Tongue - no dryness observed. Oral Mucosa - no cyanosis observed. Hypopharynx - no evidence of airway distress observed.  Chest and Lung Exam Inspection Movements - Normal and Symmetrical. Accessory muscles - No use of accessory muscles in breathing. Palpation Palpation of the chest reveals - Non-tender. Auscultation Breath sounds - Normal and Clear.  Cardiovascular Auscultation Rhythm - Regular. Murmurs & Other Heart Sounds - Auscultation of the  heart reveals - No Murmurs and No Systolic Clicks.  Abdomen Inspection Inspection of the abdomen reveals - No Visible peristalsis and No Abnormal pulsations. Umbilicus - No Bleeding, No Urine drainage. Palpation/Percussion Palpation and Percussion of the abdomen reveal - Soft, Non Tender, No Rebound tenderness, No Rigidity (guarding) and No Cutaneous hyperesthesia.  Note: Abdomen soft. Closed midline incision without hernia. Overweight but soft. Colostomy and left paramedian region without any obvious parastomal hernia. Ostomy pink. Not severely distended. No diastasis recti. No umbilical or other anterior abdominal wall hernias  Female Genitourinary Sexual Maturity Tanner 5 - Adult hair pattern. Note: No vaginal bleeding nor discharge  Peripheral Vascular Upper Extremity Inspection - Left - No Cyanotic nailbeds - Left, Not Ischemic. Inspection - Right - No Cyanotic nailbeds - Right, Not Ischemic.  Neurologic Neurologic evaluation reveals -normal attention span and ability to concentrate, able to name objects and repeat phrases. Appropriate fund of knowledge , normal sensation and normal coordination. Mental Status Affect - not angry, not paranoid. Cranial Nerves-Normal Bilaterally. Gait-Normal.  Neuropsychiatric Mental status exam performed with findings of-able to articulate well with normal speech/language, rate, volume and coherence, thought content normal with ability to perform basic computations and apply abstract reasoning and no evidence of hallucinations, delusions, obsessions or homicidal/suicidal ideation.  Musculoskeletal Global Assessment Spine, Ribs and Pelvis - no instability, subluxation or laxity. Right Upper Extremity - no instability, subluxation or laxity.  Lymphatic Head & Neck  General Head & Neck Lymphatics: Bilateral - Description - No Localized lymphadenopathy. Axillary  General Axillary Region: Bilateral - Description - No Localized  lymphadenopathy. Femoral & Inguinal  Generalized Femoral & Inguinal Lymphatics: Left - Description - No Localized lymphadenopathy. Right - Description - No Localized lymphadenopathy.    Assessment & Plan Ardeth Sportsman(Sakai Heinle C. Vincenzo Stave MD; 06/21/2020 12:30 PM)  COLOSTOMY IN PLACE (Z93.3) Impression: Recover status post emergency Hartmann resection for stercoral perforation with massive feculent contamination and multisystem organ failure.  She is recovered appointment adhesions are down of that is reasonable to consider colostomy takedown. Reasonable candidate to start out with a minimally invasive robotic approach.  I think it would be wise to get a colonoscopy the day before surgery to make sure there are surprises elsewhere. She's never had one. We'll see if we can coordinate this.  Her operative risks hopefully should be much less than the emergency situation. She is otherwise active.  Current Plans The anatomy & physiology of the digestive tract was discussed. The pathophysiology was discussed. Possibility of remaining with an ostomy permanently was discussed. I offered ostomy takedown. Laparoscopic & open techniques were discussed.  Risks such as bleeding, infection, abscess, leak, reoperation, possible re-ostomy, injury to other organs, hernia, heart attack, death, and other risks were discussed. I noted a good likelihood this will help address the problem. Goals of post-operative recovery were discussed as well. We will work to minimize complications. Questions were answered. The patient expresses understanding & wishes to proceed with surgery.  Pt Education - [facility] Ostomy HCI (Laneya Gasaway): discussed with patient and provided information.  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 An injection.  I strongly recommend fiber bowel regimen to avoid future  episodes of constipation and stercoral perforation. She will consider going from intermittent to daily use of MiraLAX or similar fiber supplement  Current Plans Instructed to schedule colonoscopywith a gastroenterologist  PREOP COLON - ENCOUNTER FOR PREOPERATIVE EXAMINATION FOR GENERAL SURGICAL PROCEDURE (Z01.818)  Current Plans You are being scheduled for surgery- Our schedulers will call you.  You should hear from our office's scheduling department within 5 working days about the location, date, and time of surgery. We try to make accommodations for patient's preferences in scheduling surgery, but sometimes the OR  schedule or the surgeon's schedule prevents [place] from making those accommodations.  If you have not heard from our office [phone]) in 5 working days, call the office and ask for your surgeon's nurse.  If you have other questions about your diagnosis, plan, or surgery, call the office and ask for your surgeon's nurse.  Written instructions provided The anatomy & physiology of the digestive tract was discussed. The pathophysiology of the colon was discussed. Natural history risks without surgery was discussed. I feel the risks of no intervention will lead to serious problems that outweigh the operative risks; therefore, I recommended a partial colectomy to remove the pathology. Minimally invasive (Robotic/Laparoscopic) & open techniques were discussed.  Risks such as bleeding, infection, abscess, leak, reoperation, possible ostomy, hernia, heart attack, death, and other risks were discussed. I noted a good likelihood this will help address the problem. Goals of post-operative recovery were discussed as well. Need for adequate nutrition, daily bowel regimen and healthy physical activity, to optimize recovery was noted as well. We will work to minimize complications. Educational materials were available as well. Questions were answered. The patient expresses  understanding & wishes to proceed with surgery.  Pt Education - [facility] Colon Bowel Prep 2018 ERAS/Miralax/Antibiotics Started Neomycin Sulfate 500 MG Oral Tablet, 2 (two) Tablet SEE NOTE, #6, 06/21/2020, No Refill. Local Order: Pharmacist Notes: TAKE TWO TABLETS AT 2 PM, 3 PM, AND 10 PM THE DAY PRIOR TO SURGERY Started Flagyl 500 MG Oral Tablet, 2 (two) Tablet SEE NOTE, #6, 06/21/2020, No Refill. Local Order: Pharmacist Notes: Take at 2pm, 3pm, and 10pm the day prior to your colon operation Pt Education - Pamphlet Given - Laparoscopic Colorectal Surgery: discussed with patient and provided information. Pt Education - [facility] Colectomy post-op instructions: discussed with patient and provided information.  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 No Entereg perioperatively.   CHRONIC CONSTIPATION (K59.09) Impression: I strongly recommend she stay on a fiber supplement indefinitely since most likely her perforation was due to severe constipation from her chronic methadone dependence. While she is less constipated now, recommend she get on something to avoid future issues. She will try and take her MiraLAX daily instead of intermittently  Current Plans Pt Education - [facility] Constipation (AT) Pt Education - [facility] Good Bowel Health (Unnamed Hino)  ACUTE DEEP VEIN THROMBOSIS (DVT) OF BRACHIAL VEIN OF RIGHT UPPER EXTREMITY (I82.621) Impression: History of deep venous thrombosis in her right upper extremity placed on Eliquis. It is been more than 3 months, so I suspect it is safe to come off his usually the standard recommendations. That had been Dr Sherene Sires with pulmonary's initial recommendation in May as well. I had discussed some of my surgical partners as well. I don't see any other risk factors. No history of prior DVT. She does not smoke. Not on a oral contraceptives. No family history of hypercoagulable disorders. Defer to  primary care.   ANTICOAGULATED (Z79.01)  Current Plans Pt Education - [facility] Hold anticoagulation preoperatively  Ardeth Sportsman, MD, FACS, MASCRS Gastrointestinal and Minimally Invasive Surgery  [facility] [address] [place], [place] [address] [phone] Fax [phone] Main/Paging  CONTACT INFORMATION: Weekday (9AM-5PM) concerns: Call [facility] main office at [phone] Weeknight (5PM-9AM) or Weekend/Holiday concerns: Check [email or web address] for General Surgery [facility] coverage (Please, do not use SecureChat as it is not reliable communication to operating surgeons for immediate patient care)

## 2020-06-27 ENCOUNTER — Encounter: Payer: Self-pay | Admitting: Gastroenterology

## 2020-07-10 ENCOUNTER — Ambulatory Visit: Payer: Self-pay | Admitting: Surgery

## 2020-07-26 ENCOUNTER — Ambulatory Visit: Payer: BLUE CROSS/BLUE SHIELD | Admitting: Gastroenterology

## 2020-09-13 ENCOUNTER — Encounter (HOSPITAL_COMMUNITY): Payer: Self-pay | Admitting: Emergency Medicine

## 2020-09-13 ENCOUNTER — Other Ambulatory Visit: Payer: Self-pay

## 2020-09-13 DIAGNOSIS — F17210 Nicotine dependence, cigarettes, uncomplicated: Secondary | ICD-10-CM | POA: Insufficient documentation

## 2020-09-13 DIAGNOSIS — N3000 Acute cystitis without hematuria: Secondary | ICD-10-CM | POA: Diagnosis not present

## 2020-09-13 DIAGNOSIS — E1165 Type 2 diabetes mellitus with hyperglycemia: Secondary | ICD-10-CM | POA: Diagnosis not present

## 2020-09-13 DIAGNOSIS — Z79899 Other long term (current) drug therapy: Secondary | ICD-10-CM | POA: Diagnosis not present

## 2020-09-13 DIAGNOSIS — R197 Diarrhea, unspecified: Secondary | ICD-10-CM | POA: Diagnosis present

## 2020-09-13 DIAGNOSIS — K9400 Colostomy complication, unspecified: Secondary | ICD-10-CM | POA: Insufficient documentation

## 2020-09-13 DIAGNOSIS — I10 Essential (primary) hypertension: Secondary | ICD-10-CM | POA: Insufficient documentation

## 2020-09-13 DIAGNOSIS — Z7901 Long term (current) use of anticoagulants: Secondary | ICD-10-CM | POA: Insufficient documentation

## 2020-09-13 NOTE — ED Triage Notes (Signed)
Patient here from home reporting new colostomy bag but states today she noticed diarrhea coming from rectum. Concerned.

## 2020-09-14 ENCOUNTER — Emergency Department (HOSPITAL_COMMUNITY)
Admission: EM | Admit: 2020-09-14 | Discharge: 2020-09-14 | Disposition: A | Discharge status: Home / Self Care | Payer: BLUE CROSS/BLUE SHIELD | Attending: Emergency Medicine | Admitting: Emergency Medicine

## 2020-09-14 DIAGNOSIS — R197 Diarrhea, unspecified: Secondary | ICD-10-CM

## 2020-09-14 DIAGNOSIS — K9400 Colostomy complication, unspecified: Secondary | ICD-10-CM

## 2020-09-14 DIAGNOSIS — N3000 Acute cystitis without hematuria: Secondary | ICD-10-CM

## 2020-09-14 LAB — URINALYSIS, ROUTINE W REFLEX MICROSCOPIC
Bilirubin Urine: NEGATIVE
Glucose, UA: NEGATIVE mg/dL
Hgb urine dipstick: NEGATIVE
Ketones, ur: 5 mg/dL — AB
Nitrite: NEGATIVE
Protein, ur: 100 mg/dL — AB
RBC / HPF: >50 RBC/hpf — ABNORMAL HIGH (ref 0–5)
Specific Gravity, Urine: 1.034 — ABNORMAL HIGH (ref 1.005–1.030)
pH: 5 (ref 5.0–8.0)

## 2020-09-14 LAB — CBC
HCT: 36 % (ref 36.0–46.0)
Hemoglobin: 11.4 g/dL — ABNORMAL LOW (ref 12.0–15.0)
MCH: 27.7 pg (ref 26.0–34.0)
MCHC: 31.7 g/dL (ref 30.0–36.0)
MCV: 87.6 fL (ref 80.0–100.0)
Platelets: 268 10*3/uL (ref 150–400)
RBC: 4.11 MIL/uL (ref 3.87–5.11)
RDW: 14.6 % (ref 11.5–15.5)
WBC: 7.2 10*3/uL (ref 4.0–10.5)
nRBC: 0 % (ref 0.0–0.2)

## 2020-09-14 LAB — LIPASE, BLOOD: Lipase: 17 U/L (ref 11–51)

## 2020-09-14 LAB — COMPREHENSIVE METABOLIC PANEL
ALT: 10 U/L (ref 0–44)
AST: 43 U/L — ABNORMAL HIGH (ref 15–41)
Albumin: 3.7 g/dL (ref 3.5–5.0)
Alkaline Phosphatase: 87 U/L (ref 38–126)
Anion gap: 11 (ref 5–15)
BUN: 13 mg/dL (ref 6–20)
CO2: 26 mmol/L (ref 22–32)
Calcium: 9 mg/dL (ref 8.9–10.3)
Chloride: 101 mmol/L (ref 98–111)
Creatinine, Ser: 0.96 mg/dL (ref 0.44–1.00)
GFR, Estimated: >60 mL/min (ref >60)
Glucose, Bld: 147 mg/dL — ABNORMAL HIGH (ref 70–99)
Potassium: 5.5 mmol/L — ABNORMAL HIGH (ref 3.5–5.1)
Sodium: 138 mmol/L (ref 135–145)
Total Bilirubin: 0.7 mg/dL (ref 0.3–1.2)
Total Protein: 7.9 g/dL (ref 6.5–8.1)

## 2020-09-14 LAB — I-STAT CHEM 8, ED
BUN: 11 mg/dL (ref 6–20)
Calcium, Ion: 1.18 mmol/L (ref 1.15–1.40)
Chloride: 99 mmol/L (ref 98–111)
Creatinine, Ser: 0.7 mg/dL (ref 0.44–1.00)
Glucose, Bld: 95 mg/dL (ref 70–99)
HCT: 37 % (ref 36.0–46.0)
Hemoglobin: 12.6 g/dL (ref 12.0–15.0)
Potassium: 3.7 mmol/L (ref 3.5–5.1)
Sodium: 141 mmol/L (ref 135–145)
TCO2: 31 mmol/L (ref 22–32)

## 2020-09-14 MED ORDER — CEPHALEXIN 500 MG PO CAPS: 500.0000 mg | ORAL_CAPSULE | Freq: Four times a day (QID) | ORAL | 0 refills | Status: AC

## 2020-09-14 MED ORDER — CEPHALEXIN 500 MG PO CAPS
500.0000 mg | ORAL_CAPSULE | Freq: Once | ORAL | Status: AC
  Administered 2020-09-14: 500 mg via ORAL
  Filled 2020-09-14: qty 1

## 2020-09-14 NOTE — ED Notes (Signed)
Introduced self to patient. Patient made this RN aware she needed to be cleaned up. Patient given supplies and gown to change.

## 2020-09-14 NOTE — ED Notes (Signed)
Patient made aware we need UA.

## 2020-09-14 NOTE — Discharge Instructions (Addendum)
Urinary Tract Infection, Adult A urinary tract infection (UTI) is an infection of any part of the urinary tract. The urinary tract includes:  The kidneys.  The ureters.  The bladder.  The urethra. These organs make, store, and get rid of pee (urine) in the body. What are the causes? This is caused by germs (bacteria) in your genital area. These germs grow and cause swelling (inflammation) of your urinary tract. What increases the risk? You are more likely to develop this condition if:  You have a small, thin tube (catheter) to drain pee.  You cannot control when you pee or poop (incontinence).  You are female, and: ? You use these methods to prevent pregnancy:  A medicine that kills sperm (spermicide).  A device that blocks sperm (diaphragm). ? You have low levels of a female hormone (estrogen). ? You are pregnant.  You have genes that add to your risk.  You are sexually active.  You take antibiotic medicines.  You have trouble peeing because of: ? A prostate that is bigger than normal, if you are female. ? A blockage in the part of your body that drains pee from the bladder (urethra). ? A kidney stone. ? A nerve condition that affects your bladder (neurogenic bladder). ? Not getting enough to drink. ? Not peeing often enough.  You have other conditions, such as: ? Diabetes. ? A weak disease-fighting system (immune system). ? Sickle cell disease. ? Gout. ? Injury of the spine. What are the signs or symptoms? Symptoms of this condition include:  Needing to pee right away (urgently).  Peeing often.  Peeing small amounts often.  Pain or burning when peeing.  Blood in the pee.  Pee that smells bad or not like normal.  Trouble peeing.  Pee that is cloudy.  Fluid coming from the vagina, if you are female.  Pain in the belly or lower back. Other symptoms include:  Throwing up (vomiting).  No urge to eat.  Feeling mixed up (confused).  Being tired  and grouchy (irritable).  A fever.  Watery poop (diarrhea). How is this treated? This condition may be treated with:  Antibiotic medicine.  Other medicines.  Drinking enough water. Follow these instructions at home:  Medicines  Take over-the-counter and prescription medicines only as told by your doctor.  If you were prescribed an antibiotic medicine, take it as told by your doctor. Do not stop taking it even if you start to feel better. General instructions  Make sure you: ? Pee until your bladder is empty. ? Do not hold pee for a long time. ? Empty your bladder after sex. ? Wipe from front to back after pooping if you are a female. Use each tissue one time when you wipe.  Drink enough fluid to keep your pee pale yellow.  Keep all follow-up visits as told by your doctor. This is important. Contact a doctor if:  You do not get better after 1-2 days.  Your symptoms go away and then come back. Get help right away if:  You have very bad back pain.  You have very bad pain in your lower belly.  You have a fever.  You are sick to your stomach (nauseous).  You are throwing up. Summary  A urinary tract infection (UTI) is an infection of any part of the urinary tract.  This condition is caused by germs in your genital area.  There are many risk factors for a UTI. These include having a small, thin   tube to drain pee and not being able to control when you pee or poop.  Treatment includes antibiotic medicines for germs.  Drink enough fluid to keep your pee pale yellow. This information is not intended to replace advice given to you by your health care provider. Make sure you discuss any questions you have with your health care provider. Document Revised: 08/19/2018 Document Reviewed: 03/11/2018 Elsevier Patient Education  [employer]. As discussed, all of your labs are reassuring today.  Your urine showed a possible urinary tract infection.  I am sending you  home with antibiotics.  Take as prescribed and finish all antibiotics.  I spoke to the general surgeon here at [facility] who recommends following up with your surgeon for further evaluation.  Return to the ER for new or worsening symptoms.  Hold your next dose of Miralax to prevent further diarrhea.

## 2020-09-14 NOTE — ED Provider Notes (Signed)
[facility]-EMERGENCY DEPT Provider Note   CSN: [id number] Arrival date & time: 09/13/20  2033     History Chief Complaint  Patient presents with  . Diarrhea    Darlene Castillo is a 59 y.o. 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 Patient had an emergency colostomy bag placed on 01/18/2020 due to sigmoid colon perforation secondary to chronic constipation and ulceration with feculent peritonitis by Dr. Luisa Hart. Shirleen Schirmer, PA-C with [phone] GI follows patient. Patient states over the past few days she has noticed diarrhea from her rectum. Admits to normal output in colostomy bag. She takes Miralax every other day to prevent constipation. Patient states she first noticed it while wipes after urinating; however, output per the rectum as increased. Denies any blood. Denies fever and chills. Admits to chronic abdominal pain, but no change from baseline. No treatment prior to arrival. No aggravating or alleviating factors.  History obtained from patient and past medical records. No interpreter used during encounter.      Past Medical History:  Diagnosis Date  . Acute deep vein thrombosis (DVT) of brachial vein of right upper extremity (HCC)   . Blurred vision   . Cervicalgia   . Chronic pain   . Diabetes mellitus without complication (HCC)    diet controlled borderline  . Headache   . Hypertension   . Numbness and tingling     Patient Active Problem List   Diagnosis Date Noted  . Vaginal discharge 06/08/2020  . Acute deep vein thrombosis (DVT) of brachial vein of right upper extremity (HCC) 05/01/2020  . Perforation of sigmoid colon (HCC) 02/21/2020  . At risk for aspiration related to tube feeding   . Status post tracheostomy (HCC)   . DM (diabetes mellitus), type 2, uncontrolled (HCC) 01/24/2020  . Hyperglycemia  01/24/2020  . Constipation, chronic 01/23/2020  . 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 01/23/2020  . 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 Acute respiratory failure with hypoxemia (HCC)   . Encephalopathy acute   . Other chronic pain   . Acute respiratory failure with hypoxia (HCC) 01/21/2020  . Endotracheally intubated   . Pneumoperitoneum 01/18/2020  . DDD (degenerative disc disease), cervical 11/16/2019  . Cervical radicular pain 11/08/2019  . Numbness and tingling 09/19/2019  . Persistent headaches 09/19/2019  . Borderline diabetes 02/09/2019  . Essential hypertension 02/09/2019  . Hx of migraine headaches 02/09/2019  . OAB (overactive bladder) 02/09/2019  . Vitamin D deficiency 02/09/2019    Past Surgical History:  Procedure Laterality Date  . CESAREAN SECTION    . COLON RESECTION N/A 01/18/2020   Procedure: EXPLORATORY LAPAROTOMY SIGMOID RESECTION AND COLOSTOMY;  Surgeon: Harriette Bouillon, MD;  Location: [facility] ORS;  Service: General;  Laterality: N/A;  . MYOMECTOMY    . wisdoms teeth extracted       OB History   No obstetric history on file.     Family History  Problem Relation Age of Onset  . Hypertension Mother   . Stroke Paternal Grandmother   . Hypertension Paternal Grandmother   . Diabetes Paternal Grandmother   . Gout Paternal Grandmother   . Alcohol abuse Paternal Grandfather     Social History   Tobacco Use  . Smoking status: Current Every Day Smoker  Packs/day: 0.50    Years: 30.00    Pack years: 15.00    Types: Cigarettes  . Smokeless tobacco: Never Used  Substance Use Topics  . Alcohol use: No  . Drug use: No    Home Medications Prior to Admission medications   Medication Sig Start Date End Date Taking? Authorizing Provider  cephALEXin (KEFLEX) 500 MG capsule Take 1 capsule (500 mg total) by mouth 4 (four) times daily. 09/14/20  Yes Haisley Arens, Merla Riches, PA-C   apixaban (ELIQUIS) 5 MG TABS tablet Take 1 tablet (5 mg total) by mouth 2 (two) times daily. 02/16/20   Juliet Rude, PA-C  cloNIDine (CATAPRES) 0.2 MG tablet Take 1 tablet (0.2 mg total) by mouth 3 (three) times daily. 02/16/20   Juliet Rude, PA-C  HYDROcodone-acetaminophen (HYCET) 7.5-325 mg/15 ml solution Take 10 mLs by mouth every 4 (four) hours as needed for moderate pain. 02/16/20   Juliet Rude, PA-C  melatonin 3 MG TABS tablet Take 1 tablet (3 mg total) by mouth at bedtime. 02/16/20   Juliet Rude, PA-C  methadone (DOLOPHINE) 1 MG/1ML solution Take 27 mg by mouth daily.    [provider]  metoprolol tartrate (LOPRESSOR) 25 MG tablet Take 4 tablets (100 mg total) by mouth 2 (two) times daily. 02/16/20   Juliet Rude, PA-C  Naldemedine Tosylate (SYMPROIC) 0.2 MG TABS Take 0.2 mg by mouth daily. 02/16/20   Juliet Rude, PA-C  olopatadine (PATANOL) 0.1 % ophthalmic solution Place 1 drop into both eyes 2 (two) times daily as needed (dry eyes).  01/02/20   [provider]  polyethylene glycol (MIRALAX / GLYCOLAX) 17 g packet Place 17 g into feeding tube daily. 02/17/20   Juliet Rude, PA-C  solifenacin (VESICARE) 10 MG tablet Take 10 mg by mouth daily as needed (depends on fluid intake).  01/07/20   [provider]  amitriptyline (ELAVIL) 25 MG tablet Take 1 tablet (25 mg total) by mouth at bedtime. Patient not taking: Reported on 11/19/2017 09/05/14 10/12/19  Suanne Marker, MD    Allergies    Ibuprofen  Review of Systems   Review of Systems  Constitutional: Negative for chills and fever.  Respiratory: Negative for shortness of breath.   Cardiovascular: Negative for chest pain.  Gastrointestinal: Positive for abdominal pain (chronic) and diarrhea. Negative for blood in stool, constipation and vomiting.  Genitourinary: Negative for dysuria and vaginal discharge.  All other systems reviewed and are negative.   Physical Exam Updated Vital  Signs BP (!) 153/82 Comment: RN notified   Pulse 64   Temp 99.4 F (37.4 C) (Oral)   Resp 19   Ht 5\' 1"  (1.549 m)   Wt 61.2 kg   LMP 05/23/2013   SpO2 98%   BMI 25.51 kg/m   Physical Exam Vitals and nursing note reviewed.  Constitutional:      General: She is not in acute distress.    Appearance: She is not toxic-appearing.  HENT:     Head: Normocephalic.  Eyes:     Pupils: Pupils are equal, round, and reactive to light.  Cardiovascular:     Rate and Rhythm: Normal rate and regular rhythm.     Pulses: Normal pulses.     Heart sounds: Normal heart sounds. No murmur heard. No friction rub. No gallop.   Pulmonary:     Effort: Pulmonary effort is normal.     Breath sounds: Normal breath sounds.  Abdominal:  General: Abdomen is flat. Bowel sounds are normal. There is no distension.     Palpations: Abdomen is soft.     Tenderness: There is no abdominal tenderness. There is no guarding or rebound.     Comments: Colostomy on left side with small output. No surrounding erythema or purulent drainage.   Musculoskeletal:     Cervical back: Neck supple.     Comments: Able to move all 4 extremities without difficulty.  Skin:    General: Skin is warm and dry.  Neurological:     General: No focal deficit present.     Mental Status: She is alert.  Psychiatric:        Mood and Affect: Mood normal.        Behavior: Behavior normal.     ED Results / Procedures / Treatments   Labs (all labs ordered are listed, but only abnormal results are displayed) Labs Reviewed  COMPREHENSIVE METABOLIC PANEL - Abnormal; Notable for the following components:      Result Value   Potassium 5.5 (*)    Glucose, Bld 147 (*)    AST 43 (*)    All other components within normal limits  CBC - Abnormal; Notable for the following components:   Hemoglobin 11.4 (*)    All other components within normal limits  URINALYSIS, ROUTINE W REFLEX MICROSCOPIC - Abnormal; Notable for the following components:    APPearance HAZY (*)    Specific Gravity, Urine 1.034 (*)    Ketones, ur 5 (*)    Protein, ur 100 (*)    Leukocytes,Ua TRACE (*)    RBC / HPF >50 (*)    Bacteria, UA RARE (*)    All other components within normal limits  URINE CULTURE  LIPASE, BLOOD  I-STAT CHEM 8, ED    EKG None  Radiology No results found.  Procedures Procedures (including critical care time)  Medications Ordered in ED Medications - No data to display  ED Course  I have reviewed the triage vital signs and the nursing notes.  Pertinent labs & imaging results that were available during my care of the patient were reviewed by me and considered in my medical decision making (see chart for details).  Clinical Course as of 09/14/20 1156  Fri Sep 14, 2020  1025 Discussed case with Dr. Cliffton Asters with surgery who notes patient could have formed a fistula, but nothing emergently needs to be done at this time. He recommends outpatient follow-up. No imaging warranted in the ED at this time. [CA]  1037 Potassium(!): 5.5 [CA]  1057 Leukocytes,Ua(!): TRACE [CA]  1057 Bacteria, UA(!): RARE [CA]  1151 Potassium: 3.7 [CA]    Clinical Course User Index [CA] Mannie Stabile, PA-C   MDM Rules/Calculators/A&P                         59 year old female presents to the ED due to diarrhea per the rectum with a colostomy.  No fever or chills.  Patient admits to normal colostomy output.  Upon arrival, stable vitals.  Mildly elevated BP.  Low suspicion for hypertensive urgency/emergency.  Patient in no acute distress and nontoxic-appearing.  Physical exam reassuring.  Abdomen soft, nondistended, nontender.  Low suspicion for acute abdomen. Colostomy bag on left side with mild output.  No signs of infection.  Routine labs and UA ordered at triage.  UA significant for trace leukocytes and rare bacteria.  No nitrites.  Proteinuria and ketonuria. Patient admits  to some dysuria. Will treat with Keflex for acute cystitis.  CBC  reassuring with no leukocytosis and slight anemia with hemoglobin 11.4.  Lipase normal at 17.  Doubt pancreatitis.  CMP significant for hyperkalemia at 5.5 and mildly elevated AST at 43.  Hyperglycemia 147 with no anion gap.  Doubt DKA.  Will redraw potassium to check for accuracy.  EKG personally reviewed which demonstrates normal sinus rhythm.  No peaked T waves.  No signs of acute ischemia.  Discussed case with Dr. Cliffton AstersWhite with general surgery.  See note above.  Chem-8 unremarkable.  No concern for hyperkalemia at this time.  Suspect first potassium level inaccurate.  Instructed patient to follow-up with her general surgeon on Monday for further evaluation of diarrhea.  Advised patient to hold her next dose of MiraLAX. Strict ED precautions discussed with patient. Patient states understanding and agrees to plan. Patient discharged home in no acute distress and stable vitals.  Discussed case with Dr. Rosalia Hammersay who agrees with assessment and plan. Final Clinical Impression(s) / ED Diagnoses Final diagnoses:  Diarrhea, unspecified type  Colostomy complication (HCC)  Acute cystitis without hematuria    Rx / DC Orders ED Discharge Orders         Ordered    cephALEXin (KEFLEX) 500 MG capsule  4 times daily        09/14/20 1154           Jesusita Okaberman, Shilynn Hoch C, PA-C 09/14/20 1217    Margarita Grizzleay, Danielle, MD 09/15/20 [phone]

## 2020-09-16 LAB — URINE CULTURE: Culture: <10000 — AB

## 2021-06-20 IMAGING — DX DG CHEST 1V PORT
1 series · 1 of 1 positions shown · non-contrast
Comparison: Chest radiograph 01/18/2019

CLINICAL DATA: Acute respiratory failure with hypoxemia.

EXAM:
PORTABLE CHEST 1 VIEW

[chest ap]
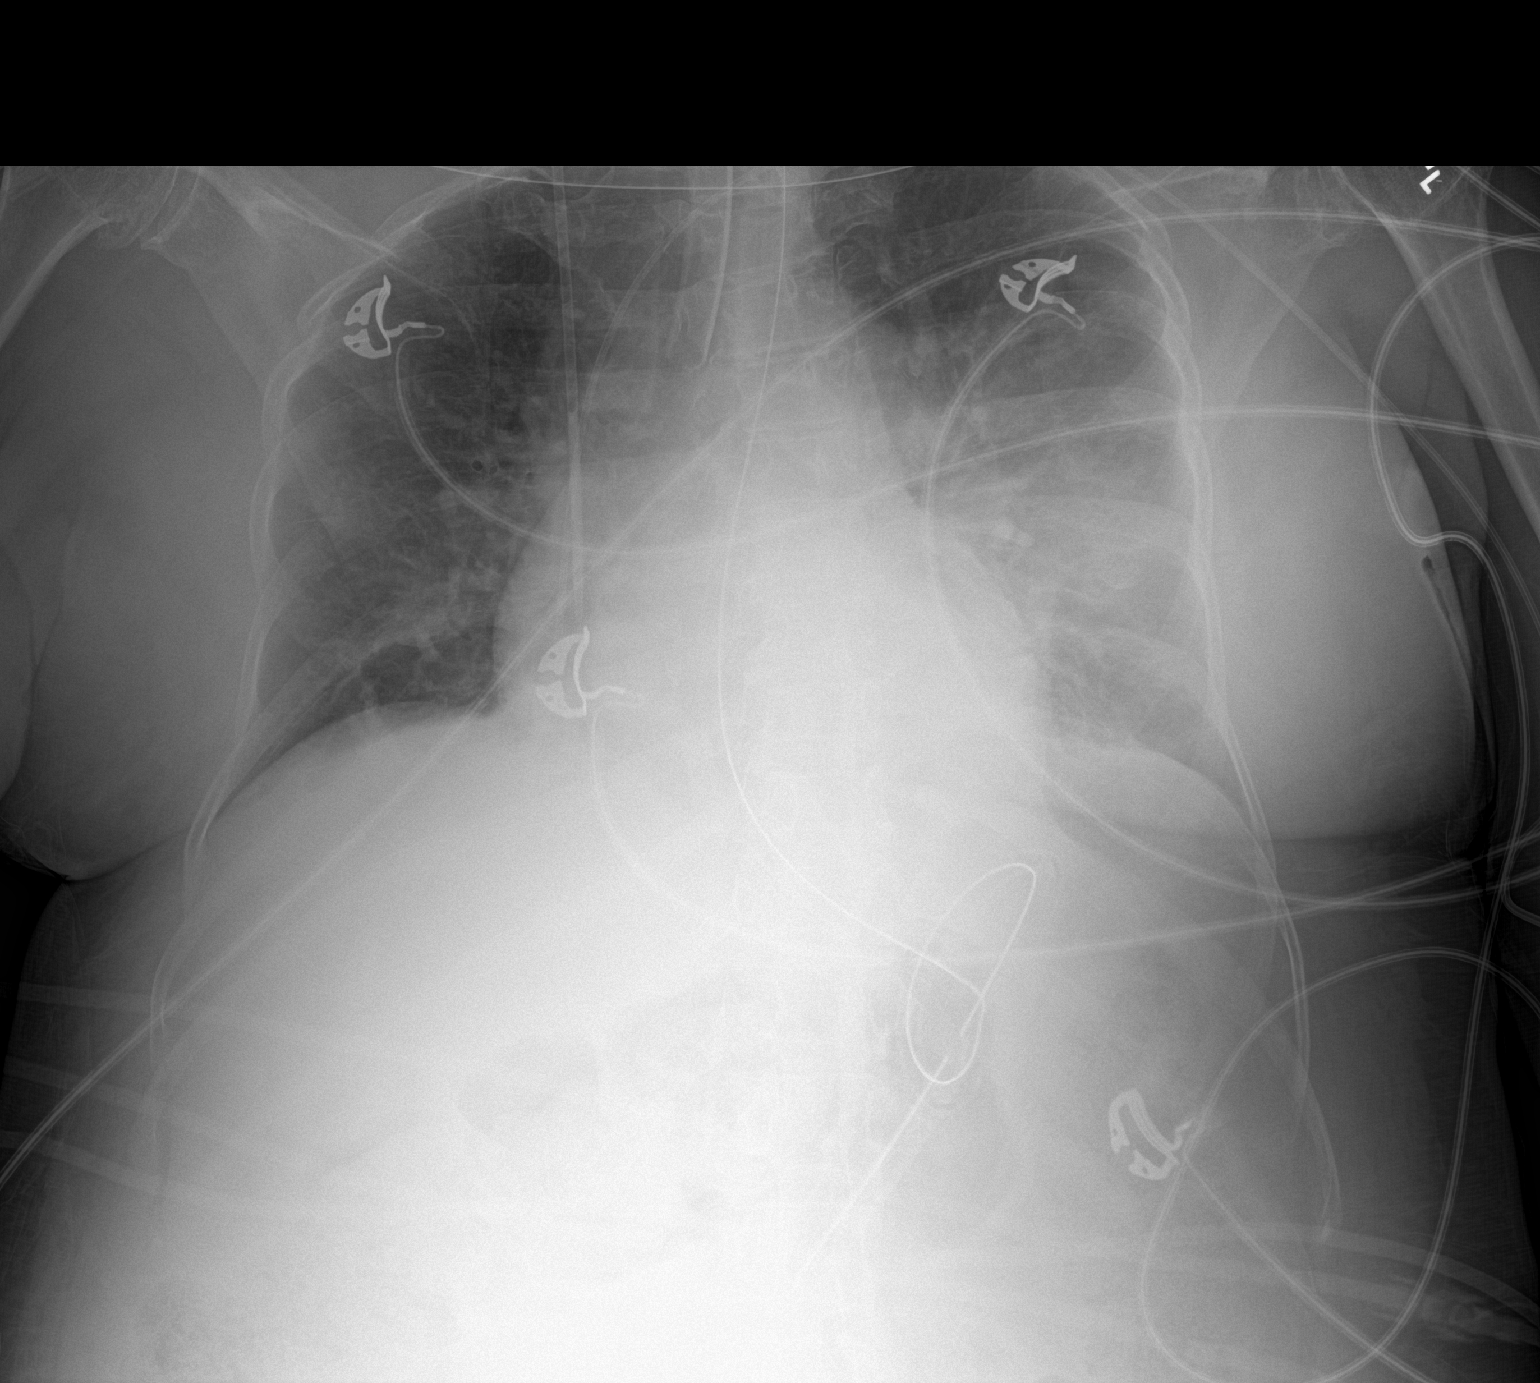

[1 of 1 positions shown; findings below may reference images not displayed]

FINDINGS: ET tube terminates 1.5 cm above the level of carina. A new
left-sided PICC terminates within the right atrium. A right IJ
approach central venous catheter terminates within the right atrium.
An enteric tube passes below the level of the left hemidiaphragm,
coils within the stomach and terminates within the mid to distal
stomach.

Heart size within normal limits. Interval increase in ill-defined
opacity within the mid to the lower left lung which may reflect
pneumonia, edema and/or atelectasis. Minimal atelectasis within the
right lung base. No evidence of pneumothorax. No acute bony
abnormality.
IMPRESSION: Support apparatus as described. Please note the enteric tube coils
within the stomach.

Interval increase in ill-defined opacity within the mid to lower
left lung which may reflect pneumonia, edema and/or atelectasis.

Minimal atelectasis within the right lung base.

## 2021-06-23 IMAGING — CT CT ABD-PELV W/ CM
2 of 5 series · 15 of 46 positions shown, 17 images · IV contrast (OMNIPAQUE)
Comparison: 01/18/2011

CLINICAL DATA: No history of recent sigmoid colon perforation with
subsequent left-sided colostomy and Hartmann's pouch creation,
postoperative fever evaluate for possible abscess

EXAM:
CT ABDOMEN AND PELVIS WITH CONTRAST
TECHNIQUE: Multidetector CT imaging of the abdomen and pelvis was performed
using the standard protocol following bolus administration of
intravenous contrast.
CONTRAST:  100mL OMNIPAQUE IOHEXOL 300 MG/ML  SOLN

[Series 2: axial st · axial · 0.72mm/px · z∈[-677,-297]mm · 12 of 88 slices shown, 14 images]
[im 6/88  soft-tissue]
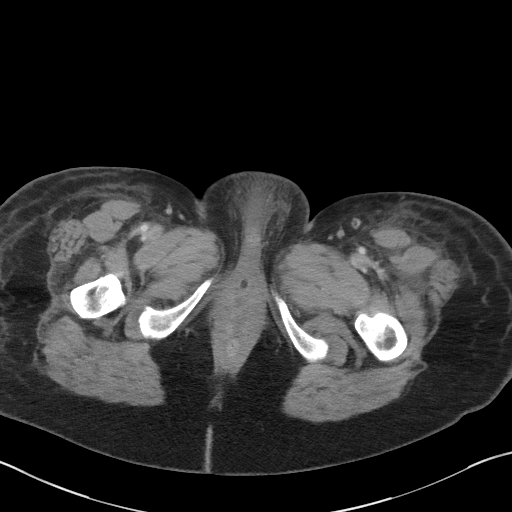
[im 6/88  bone]
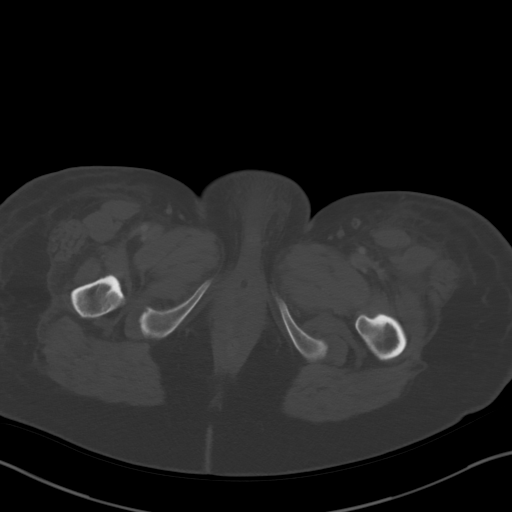
[im 12/88  soft-tissue]
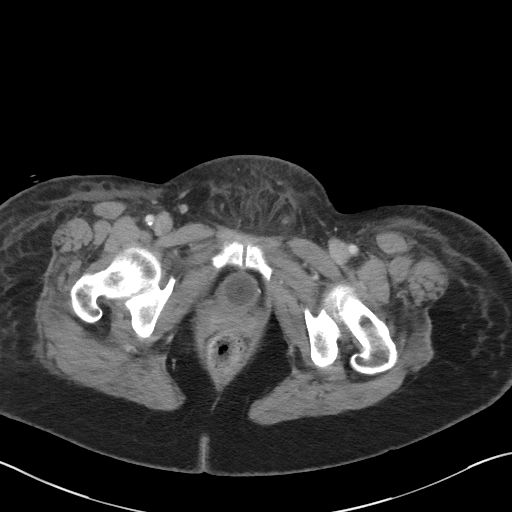
[im 18/88  soft-tissue]
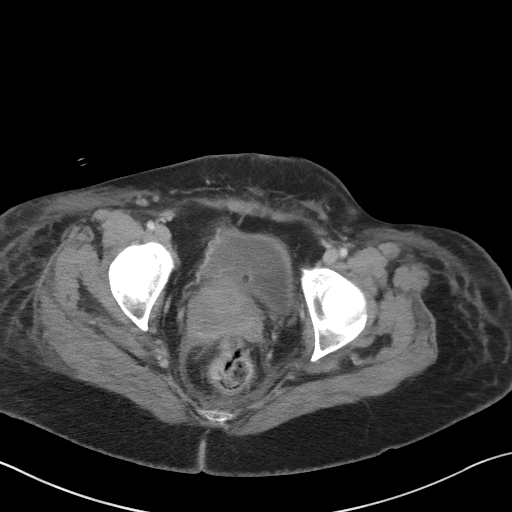
[im 30/88  soft-tissue]
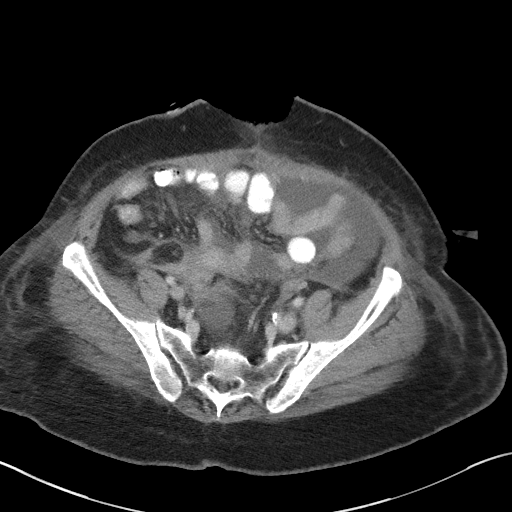
[im 35/88  soft-tissue]
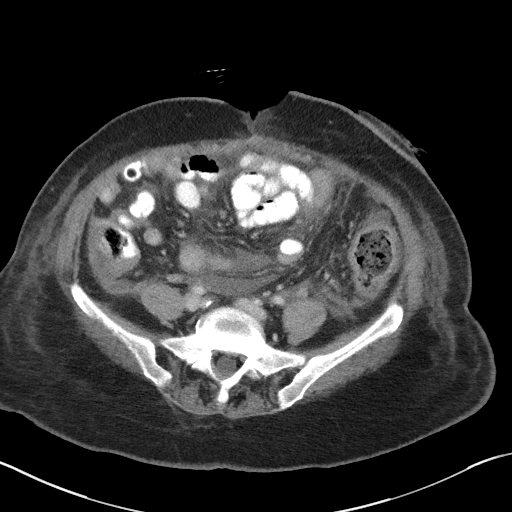
[im 41/88  soft-tissue]
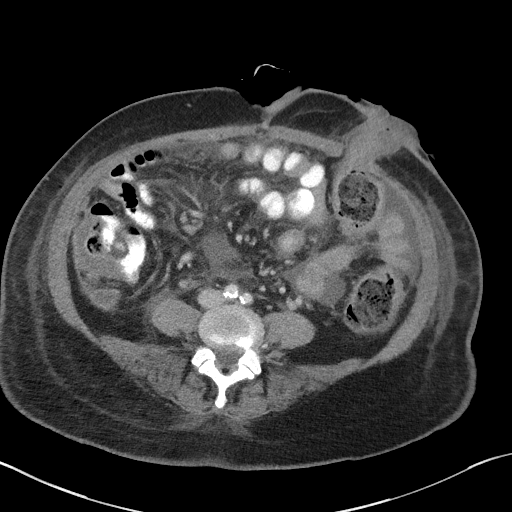
[im 47/88  soft-tissue]
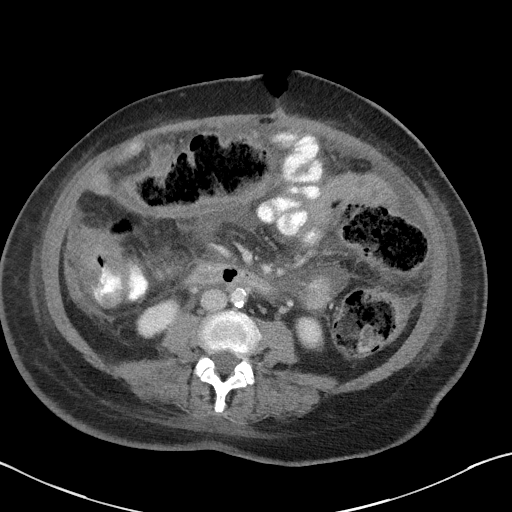
[im 53/88  soft-tissue]
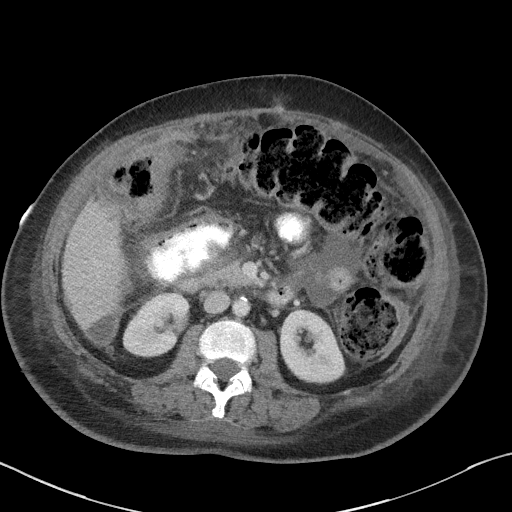
[im 59/88  soft-tissue]
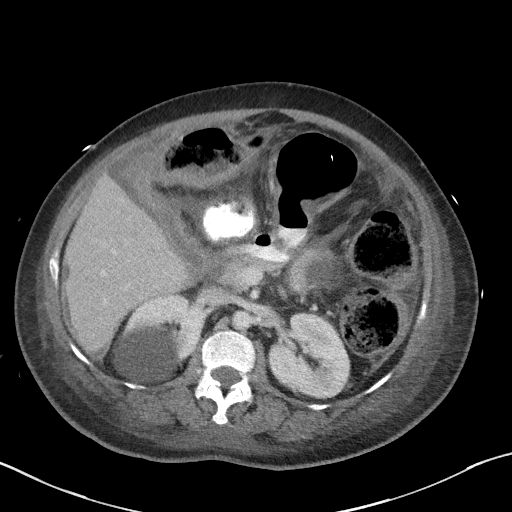
[im 59/88  bone]
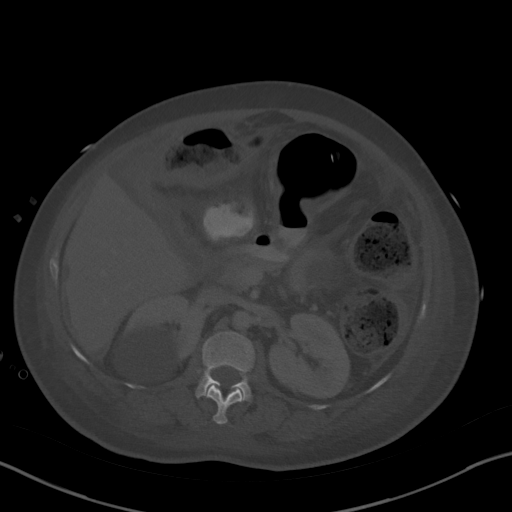
[im 70/88  soft-tissue]
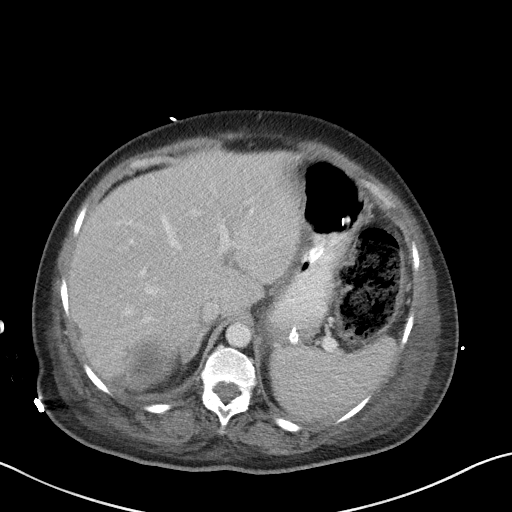
[im 76/88  soft-tissue]
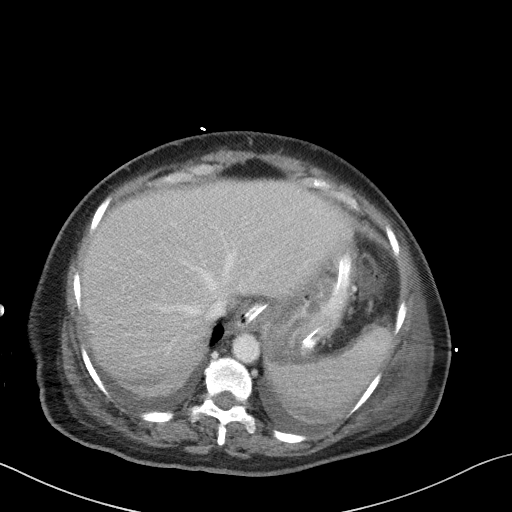
[im 82/88  soft-tissue]
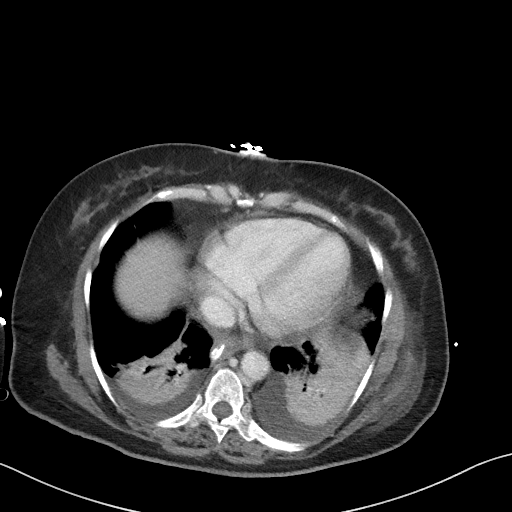

[Series 4: coronal st · coronal · 0.88mm/px · 3 of 86 slices shown]
[im 29/86  soft-tissue]
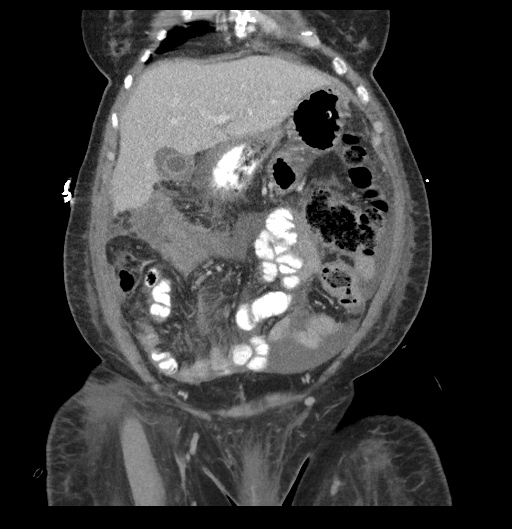
[im 38/86  soft-tissue]
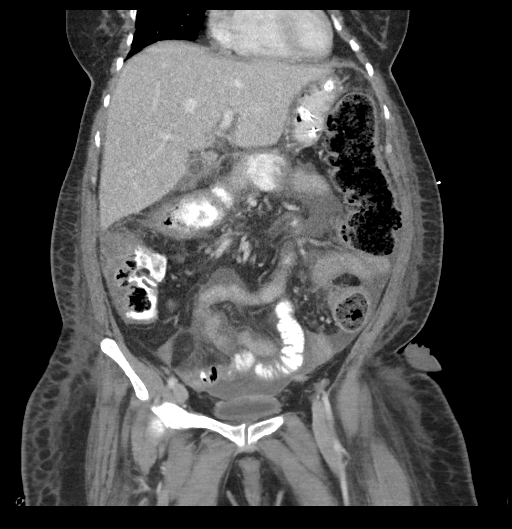
[im 48/86  soft-tissue]
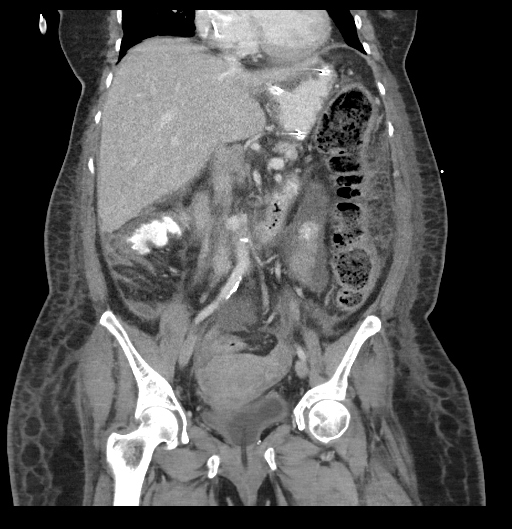

[15 of 46 positions shown; findings below may reference images not displayed]

FINDINGS: Lower chest: Lung bases demonstrate bilateral lower lobe
consolidation with associated effusions. This may be related to
postoperative atelectasis although superimposed pneumonia cannot be
totally excluded.

Hepatobiliary: Liver demonstrates diffuse fatty infiltration. The
gallbladder is decompressed with some mild pericholecystic fluid
which is likely related to the recent surgery and perforation. Some
additional fluid is noted along the tip of the liver inferiorly.

Pancreas: Unremarkable. No pancreatic ductal dilatation or
surrounding inflammatory changes.

Spleen: Spleen is within normal limits.

Adrenals/Urinary Tract: Adrenal glands are unremarkable. Kidneys
demonstrate a normal enhancement pattern. Stable large right renal
cyst is again noted. No renal calculi or obstructive changes are
noted. The bladder is decompressed by Foley catheter.

Stomach/Bowel: Postsurgical changes are noted in the colon with a
Hartmann's pouch deep within the pelvis. There is an end colostomy
identified in the left anterior abdominal wall. No complicating
factors are seen. There remains a considerable amount of fecal
material throughout the colon with diffuse wall thickening similar
to that noted on prior exam. No definitive colonic perforation is
noted at this time. Small bowel is well enhanced and within normal
limits. Gastric catheter is noted in place. The stomach is
decompressed.

Vascular/Lymphatic: Aortic atherosclerosis. No enlarged abdominal or
pelvic lymph nodes.

Reproductive: Uterus is stable in appearance.

Other: Free fluid is noted within the pelvis both anterior and
posterior to the uterus as well as extending superiorly surrounding
the loops of colon and small bowel. This is likely related to the
recent surgery. No definitive enhancing fluid collection to suggest
abscess formation is noted at this time.

Musculoskeletal: Postsurgical wound is noted in the anterior abdomen
healing by secondary intention. Degenerative changes of lumbar spine
are seen. The nodular changes in the right breast are less well
visualized on today's exam.
IMPRESSION: Postsurgical changes consistent with the given clinical history. No
colostomy complications are seen. There remains diffuse inflammatory
wall thickening throughout the colon with diffuse fecal material
identified. No findings of perforation are noted.

Considerable free fluid within the abdomen and pelvis likely related
to the recent surgery. No definitive abscess is noted at this time.
Continued follow-up is recommended.

Fluid surrounding the gallbladder which is predominately
decompressed likely related to the free fluid within the abdomen and
reactive in nature. No definitive changes of cholecystitis are seen.

Bilateral lower lobe consolidation with associated effusions. This
may be related to the postoperative state although superimposed
pneumonia could not be totally excluded. This likely contributes to
the underlying fevers.

## 2024-09-30 ENCOUNTER — Other Ambulatory Visit: Payer: Self-pay | Admitting: Family Medicine

## 2024-09-30 DIAGNOSIS — Z1231 Encounter for screening mammogram for malignant neoplasm of breast: Secondary | ICD-10-CM
